# Patient Record
Sex: Female | Born: 1995 | Race: Black or African American
Health system: Southern US, Community
[De-identification: ages and names within clinical notes are randomized; demographics above are authoritative.]

## PROBLEM LIST (undated history)

## (undated) ENCOUNTER — Inpatient Hospital Stay (HOSPITAL_COMMUNITY): Payer: Self-pay

## (undated) ENCOUNTER — Inpatient Hospital Stay: Payer: Self-pay

## (undated) DIAGNOSIS — R531 Weakness: Secondary | ICD-10-CM

## (undated) DIAGNOSIS — R52 Pain, unspecified: Secondary | ICD-10-CM

## (undated) DIAGNOSIS — R0600 Dyspnea, unspecified: Secondary | ICD-10-CM

## (undated) DIAGNOSIS — D649 Anemia, unspecified: Secondary | ICD-10-CM

## (undated) DIAGNOSIS — Z62810 Personal history of physical and sexual abuse in childhood: Secondary | ICD-10-CM

## (undated) DIAGNOSIS — G43909 Migraine, unspecified, not intractable, without status migrainosus: Secondary | ICD-10-CM

## (undated) DIAGNOSIS — Z8619 Personal history of other infectious and parasitic diseases: Secondary | ICD-10-CM

## (undated) HISTORY — DX: Weakness: R53.1

## (undated) HISTORY — PX: WISDOM TOOTH EXTRACTION: SHX21

## (undated) HISTORY — DX: Pain, unspecified: R52

## (undated) HISTORY — PX: CHOLECYSTECTOMY: SHX55

## (undated) HISTORY — PX: APPENDECTOMY (OPEN): SHX54

## (undated) HISTORY — DX: Migraine, unspecified, not intractable, without status migrainosus: G43.909

## (undated) HISTORY — DX: Anemia, unspecified: D64.9

## (undated) HISTORY — DX: Personal history of physical and sexual abuse in childhood: Z62.810

## (undated) HISTORY — PX: APPENDECTOMY: SHX54

---
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
## 2014-04-15 ENCOUNTER — Emergency Department
Admission: EM | Admit: 2014-04-15 | Discharge: 2014-04-15 | Disposition: A | Discharge status: Home / Self Care | Payer: No Typology Code available for payment source | Attending: Pediatric Emergency Medicine | Admitting: Pediatric Emergency Medicine

## 2014-04-15 ENCOUNTER — Emergency Department: Payer: Self-pay

## 2014-04-15 DIAGNOSIS — R51 Headache: Secondary | ICD-10-CM | POA: Insufficient documentation

## 2014-04-15 DIAGNOSIS — R519 Headache, unspecified: Secondary | ICD-10-CM

## 2014-04-15 DIAGNOSIS — R55 Syncope and collapse: Secondary | ICD-10-CM | POA: Insufficient documentation

## 2014-04-15 LAB — COMPREHENSIVE METABOLIC PANEL
ALT: 36 U/L (ref 0–55)
AST (SGOT): 22 U/L (ref 5–34)
Albumin/Globulin Ratio: 1.1 (ref 0.9–2.2)
Albumin: 3.9 g/dL (ref 3.5–5.0)
Alkaline Phosphatase: 74 U/L (ref 50–130)
Anion Gap: 8 (ref 5.0–15.0)
BUN: 16.6 mg/dL (ref 7.0–19.0)
Bilirubin, Total: 0.7 mg/dL (ref 0.2–1.2)
CO2: 25 mEq/L (ref 22–29)
Calcium: 9.4 mg/dL (ref 8.5–10.5)
Chloride: 108 mEq/L (ref 100–111)
Creatinine: 0.8 mg/dL (ref 0.6–1.0)
Globulin: 3.5 g/dL (ref 2.0–3.6)
Glucose: 81 mg/dL (ref 70–100)
Potassium: 4.1 mEq/L (ref 3.5–5.1)
Protein, Total: 7.4 g/dL (ref 6.0–8.3)
Sodium: 141 mEq/L (ref 136–145)

## 2014-04-15 LAB — CBC AND DIFFERENTIAL
Basophils Absolute Automated: 0.03 10*3/uL (ref 0.00–0.20)
Basophils Automated: 1 %
Eosinophils Absolute Automated: 0.17 10*3/uL (ref 0.00–0.70)
Eosinophils Automated: 4 %
Hematocrit: 36.5 % — ABNORMAL LOW (ref 37.0–47.0)
Hgb: 12.1 g/dL (ref 12.0–16.0)
Immature Granulocytes Absolute: 0.01 10*3/uL
Immature Granulocytes: 0 %
Lymphocytes Absolute Automated: 1.81 10*3/uL (ref 0.50–4.40)
Lymphocytes Automated: 41 %
MCH: 29.6 pg (ref 28.0–32.0)
MCHC: 33.2 g/dL (ref 32.0–36.0)
MCV: 89.2 fL (ref 80.0–100.0)
MPV: 9.9 fL (ref 9.4–12.3)
Monocytes Absolute Automated: 0.48 10*3/uL (ref 0.00–1.20)
Monocytes: 11 %
Neutrophils Absolute: 1.96 10*3/uL (ref 1.80–8.10)
Neutrophils: 44 %
Platelets: 340 10*3/uL (ref 140–400)
RBC: 4.09 10*6/uL — ABNORMAL LOW (ref 4.20–5.40)
RDW: 14 % (ref 12–15)
WBC: 4.45 10*3/uL (ref 3.50–10.80)

## 2014-04-15 LAB — HCG, SERUM, QUALITATIVE: Hcg Qualitative: NEGATIVE

## 2014-04-15 MED ORDER — SODIUM CHLORIDE 0.9 % IV BOLUS
1000.0000 mL | Freq: Once | INTRAVENOUS | Status: AC
Start: 2014-04-15 — End: 2014-04-15
  Administered 2014-04-15: 1000 mL via INTRAVENOUS

## 2014-04-15 MED ORDER — KETOROLAC TROMETHAMINE 30 MG/ML IJ SOLN
30.0000 mg | Freq: Once | INTRAMUSCULAR | Status: AC
Start: 2014-04-15 — End: 2014-04-15
  Administered 2014-04-15: 30 mg via INTRAVENOUS
  Filled 2014-04-15: qty 1

## 2014-04-15 NOTE — ED Notes (Signed)
Per pt, was feeling nauseous and dizzy with headache, was talking to her family and then does not remember. Witnessed by mother who states pt fell PTA from standing position and regained consciousness within seconds. Now with headache, CP

## 2014-04-15 NOTE — Discharge Instructions (Addendum)
Syncope     You have been diagnosed with syncope (pronounced SINK-uh-pee).     This is the medical term for a rapid loss of consciousness or a fainting episode. There are many causes of syncope. Some of these are life-threatening and others are not serious. Most patients with life-threatening causes are admitted to the hospital for further testing. Patients without life-threatening conditions may be sent home.     Some non-life threatening causes of syncope include dehydration, heat exhaustion, vasovagal events (simple fainting), and side-effects from new medication.     Treatment of syncope depends on the cause. In general it is a good idea to drink plenty of fluids and avoid strenuous activity for at least 24 hours after fainting.     Follow up with your regular doctor within 3 days.     YOU SHOULD SEEK MEDICAL ATTENTION IMMEDIATELY, EITHER HERE OR AT THE NEAREST EMERGENCY DEPARTMENT, IF ANY OF THE FOLLOWING OCCURS:  · You continue to faint (pass out).  · You have any chest pain with your fainting.  · You notice any palpitations or strange heart beats before fainting.  · You notice any blood in your stools. Blood may be bright red or dark red or black and tar-like.  · You feel weak or lightheaded or do not improve as expected.  · You become worse or have other concerns.          Headache     You have been treated for a headache.     Headaches are very common. Most of the time they are benign (not harmful). Some headaches can be very serious. Your headache appears to be benign. The doctor feels it is OK for you to go home.     If you continue to have headaches, or if this headache does not resolve over the next few days, you should be evaluated by your regular doctor or a neurologist. Keep a “headache diary.” This may help your doctor learn the cause of your headaches.     Take your headache medication as directed. This is especially important if your doctor has placed you on a daily medication to prevent  headaches.     YOU SHOULD SEEK MEDICAL ATTENTION IMMEDIATELY, EITHER HERE OR AT THE NEAREST EMERGENCY DEPARTMENT, IF ANY OF THE FOLLOWING OCCURS:  · Your headache gets worse.  · You have a severe headache that occurs suddenly.  · Your head pain is different from your normal headache.  · You have a fever (temperature higher than 100.4ºF / 38ºC), especially with a stiff neck.  · You feel numbness, tingling, or weakness in your arms or legs.  · You pass out.  · You have problems with your vision.  · You vomit and have trouble taking medication or keeping it down.

## 2014-04-15 NOTE — ED Provider Notes (Signed)
Physician/Midlevel provider first contact with patient: 04/15/14 1953         History     Chief Complaint   Patient presents with   . Syncope   . Headache     HPI Comments: 18yo female presents with syncopal episode this evening.  Pt was talking with Parents, standing up, with complaints of dizziness and nausea.  Pt proceeded to have syncopal episode, woke up on floor, and had complaints of headache.  Pt Mother witnessed event, denies head injury.  Pt has never had syncopal episode in the past, but has had feelings of dizziness.  Pt has not had any recent illness.  Pt tolerating PO well.      Patient is a 18 y.o. female presenting with syncope. The history is provided by the patient and a parent. No language interpreter was used.   Loss of Consciousness  Episode history:  Single  Most recent episode:  Today  Duration:  3 seconds  Timing: Once.  Progression:  Improving  Chronicity:  New  Context: dehydration and standing up    Context: not blood draw and not urination    Witnessed: yes    Relieved by:  Lying down  Worsened by:  Posture  Associated symptoms: dizziness, headaches and nausea    Associated symptoms: no anxiety, no chest pain, no confusion, no difficulty breathing, no fever, no palpitations, no recent injury, no seizures, no shortness of breath, no visual change and no vomiting    Headaches:     Severity:  Moderate    Onset quality:  Gradual    Duration:  1 hour    Timing:  Constant    Progression:  Unchanged    Chronicity:  New  Nausea:     Severity:  Moderate    Onset quality:  Gradual    Duration:  1 hour    Timing:  Intermittent    Progression:  Unchanged        History reviewed. No pertinent past medical history.    Past Surgical History   Procedure Laterality Date   . Cholecystectomy     . Appendectomy         History reviewed. No pertinent family history.    Social  History   Substance Use Topics   . Smoking status: Never Smoker    . Smokeless tobacco: Not on file   . Alcohol Use: No       .      Allergies   Allergen Reactions   . Penicillins Shortness Of Breath and Rash       Discharge Medication List as of 04/15/2014 10:02 PM      CONTINUE these medications which have NOT CHANGED    Details   levonorgestrel-ethinyl estradiol (AVIANE,ALESSE,LESSINA) 0.1-20 MG-MCG per tablet Take 1 tablet by mouth daily., Until Discontinued, Historical Med              Review of Systems   Constitutional: Negative for fever, activity change and appetite change.   HENT: Negative for congestion and rhinorrhea.    Eyes: Negative for discharge and redness.   Respiratory: Negative for shortness of breath and stridor.    Cardiovascular: Positive for syncope. Negative for chest pain and palpitations.   Gastrointestinal: Positive for nausea. Negative for vomiting and abdominal pain.   Genitourinary: Negative for decreased urine volume and difficulty urinating.   Musculoskeletal: Negative for myalgias and arthralgias.   Skin: Negative for color change and rash.   Neurological: Positive for  dizziness, syncope and headaches. Negative for seizures.   Psychiatric/Behavioral: Negative for behavioral problems and confusion.       Physical Exam    BP: 122/77 mmHg, Heart Rate: 102, Temp: 99 F (37.2 C), Resp Rate: 18, SpO2: 99 %, Weight: 56.8 kg    Physical Exam   Constitutional: She is oriented to person, place, and time. She appears well-developed and well-nourished. No distress.   HENT:   Head: Normocephalic and atraumatic.   Right Ear: External ear normal.   Left Ear: External ear normal.   Nose: Nose normal.   Mouth/Throat: Oropharynx is clear and moist. No oropharyngeal exudate.   Eyes: Conjunctivae and EOM are normal. Pupils are equal, round, and reactive to light. Right eye exhibits no discharge. Left eye exhibits no discharge.   Neck: Normal range of motion. Neck supple. No JVD present.   Cardiovascular: Normal rate, regular rhythm, normal heart sounds and intact distal pulses.  Exam reveals no gallop and no friction rub.    No  murmur heard.  Pulmonary/Chest: Effort normal and breath sounds normal. No stridor. No respiratory distress. She has no wheezes.   Abdominal: Soft. Bowel sounds are normal. She exhibits no distension. There is no tenderness.   Musculoskeletal: Normal range of motion. She exhibits no edema or tenderness.   Neurological: She is alert and oriented to person, place, and time. No cranial nerve deficit. She exhibits normal muscle tone. Coordination normal.   Skin: Skin is warm and dry. No rash noted. She is not diaphoretic.   Nursing note and vitals reviewed.      MDM and ED Course     ED Medication Orders    Start     Status Ordering Provider    04/15/14 2134  ketorolac (TORADOL) injection 30 mg   Once     Route: Intravenous  Ordered Dose: 30 mg     Last MAR action:  Given [place] Surges    04/15/14 2039  sodium chloride 0.9 % bolus 1,000 mL   Once     Route: Intravenous  Ordered Dose: 1,000 mL     Last MAR action:  Stopped Harneet Noblett           MDM  Number of Diagnoses or Management Options  Generalized headache:   Syncope and collapse:   Diagnosis management comments: Diff Dx  Vasovagal syncope  Dehydration  Pregnancy  Viral syndrome  Intracranial process  Cardiac syncope       Amount and/or Complexity of Data Reviewed  Clinical lab tests: reviewed  Tests in the medicine section of CPT: reviewed    Risk of Complications, Morbidity, and/or Mortality  General comments: 18yo female with syncopal episode associated with dizziness and nausea just prior to arrival with resultant headache afterwards.  ECG reveals sinus tachycardia with nonspecific T wave abnormality, nonspecific ECG.  Orthostatics are unremarkable.  Pt given IVF rehydration for dehydration.  Labs reveal unremarkable CBC and CMP.  BHCG negative.  Upon reevaluation, Pt dizziness has improved, but with continued headache.  Pt given Toradol for headache.  Discussed findings with Pt and Mother, agree with plan.  [place] with supportive care and PMD follow up as  outpatient.    Patient Progress  Patient progress: stable      Oxygen saturation by pulse oximetry is 95%-100%, Normal.  Interventions: Patient Observed.    EKG Interpretation:    Rhythm:  Sinus Tachycardia  Ectopy:  None  Rate:  Normal  Conduction:  No blocks  ST  Segments:  Normal ST segments  T Waves:  Non-specific T Wave abnormality   Axis:  Normal  Q Waves:  None seen  Pacing:  Not applicable  Clinical Impression:  Non-specific EKG      Procedures    Clinical Impression & Disposition     Clinical Impression  Final diagnoses:   Syncope and collapse   Generalized headache        ED Disposition    Discharge [name] discharge to home/self care.    Condition at disposition: Stable             Discharge Medication List as of 04/15/2014 10:02 PM                          Ivan Croft, MD  04/16/14 1610

## 2014-04-16 LAB — ECG 12-LEAD
Atrial Rate: 101 {beats}/min
P Axis: 77 degrees
P-R Interval: 130 ms
Q-T Interval: 348 ms
QRS Duration: 82 ms
QTC Calculation (Bezet): 451 ms
R Axis: 74 degrees
T Axis: -11 degrees
Ventricular Rate: 101 {beats}/min

## 2015-03-31 ENCOUNTER — Ambulatory Visit (INDEPENDENT_AMBULATORY_CARE_PROVIDER_SITE_OTHER): Payer: Managed Care, Other (non HMO) | Admitting: Obstetrics and Gynecology

## 2015-03-31 VITALS — BP 120/73 | HR 103 | Ht 65.0 in | Wt 119.3 lb

## 2015-03-31 DIAGNOSIS — Z369 Encounter for antenatal screening, unspecified: Secondary | ICD-10-CM

## 2015-03-31 DIAGNOSIS — Z36 Encounter for antenatal screening of mother: Secondary | ICD-10-CM

## 2015-03-31 NOTE — Progress Notes (Cosign Needed)
[facility].   P0. Pregnancy eduction material explained and given. NOB labs ordered. HIV consent form signed. PNV encouraged and samples of Provida DHA given. NT ordered. Pt. To follow up with provider in 9 weeks for NOB physical. To follow up with for dating and viability scan in 4 weeks. Pt was not on contraception at the time she conceived. Denies cats or smoking in the home. Pt states she has a 1st cousin who is deaf, and a 1st cousin with dwarfism. All questions answered.

## 2015-03-31 NOTE — Patient Instructions (Signed)
Pt to f/u in 4 weeks for dating and viability scan

## 2015-04-03 ENCOUNTER — Other Ambulatory Visit: Payer: Self-pay | Admitting: Obstetrics and Gynecology

## 2015-04-03 ENCOUNTER — Other Ambulatory Visit: Payer: Managed Care, Other (non HMO)

## 2015-04-03 ENCOUNTER — Telehealth: Payer: Self-pay | Admitting: Obstetrics and Gynecology

## 2015-04-03 DIAGNOSIS — Z369 Encounter for antenatal screening, unspecified: Secondary | ICD-10-CM

## 2015-04-03 NOTE — Telephone Encounter (Signed)
Pt wanted to know if its safe to take the hep B vaccine while being pregnant.

## 2015-04-04 ENCOUNTER — Telehealth: Payer: Self-pay | Admitting: Obstetrics and Gynecology

## 2015-04-04 LAB — CBC WITH DIFFERENTIAL/PLATELET
Basophils Absolute: 0 10*3/uL (ref 0.0–0.2)
Basos: 0 %
EOS (ABSOLUTE): 0.1 10*3/uL (ref 0.0–0.4)
EOS: 3 %
HEMOGLOBIN: 10.8 g/dL — AB (ref 11.1–15.9)
Hematocrit: 33.3 % — ABNORMAL LOW (ref 34.0–46.6)
Immature Grans (Abs): 0 10*3/uL (ref 0.0–0.1)
Immature Granulocytes: 0 %
Lymphocytes Absolute: 2.4 10*3/uL (ref 0.7–3.1)
Lymphs: 48 %
MCH: 28.6 pg (ref 26.6–33.0)
MCHC: 32.4 g/dL (ref 31.5–35.7)
MCV: 88 fL (ref 79–97)
MONOS ABS: 0.4 10*3/uL (ref 0.1–0.9)
Monocytes: 8 %
Neutrophils Absolute: 2 10*3/uL (ref 1.4–7.0)
Neutrophils: 41 %
PLATELETS: 406 10*3/uL — AB (ref 150–379)
RBC: 3.77 x10E6/uL (ref 3.77–5.28)
RDW: 13.8 % (ref 12.3–15.4)
WBC: 5 10*3/uL (ref 3.4–10.8)

## 2015-04-04 LAB — URINALYSIS, ROUTINE W REFLEX MICROSCOPIC
Bilirubin, UA: NEGATIVE
Glucose, UA: NEGATIVE
Ketones, UA: NEGATIVE
Leukocytes, UA: NEGATIVE
NITRITE UA: NEGATIVE
Protein, UA: NEGATIVE
RBC, UA: NEGATIVE
Specific Gravity, UA: 1.013 (ref 1.005–1.030)
Urobilinogen, Ur: 0.2 mg/dL (ref 0.2–1.0)
pH, UA: 6 (ref 5.0–7.5)

## 2015-04-04 LAB — ABO AND RH: RH TYPE: POSITIVE

## 2015-04-04 LAB — HEP, RPR, HIV PANEL
HIV Screen 4th Generation wRfx: NONREACTIVE
Hepatitis B Surface Ag: NEGATIVE
RPR Ser Ql: NONREACTIVE

## 2015-04-04 LAB — VARICELLA ZOSTER ANTIBODY, IGG: Varicella zoster IgG: 241 index (ref >165)

## 2015-04-04 LAB — ANTIBODY SCREEN: Antibody Screen: NEGATIVE

## 2015-04-04 LAB — RUBELLA SCREEN: Rubella Antibodies, IGG: 1.7 index (ref >0.99)

## 2015-04-04 LAB — SICKLE CELL SCREEN: SICKLE CELL SCREEN: NEGATIVE

## 2015-04-04 NOTE — Telephone Encounter (Signed)
LM for pt informing her that Dr. Valentino Saxon recommended she not have a Hep C vaccine while pregnant. Pt advised to call back with any questions or concerns.

## 2015-04-04 NOTE — Telephone Encounter (Signed)
PT CALLED AND SHE WANTED TO KNOW IF IT IS SAFE TO BE AROUND OR WORK WITH METHOLTUCBUTALETHER, ITS A LIQUID, SHE WORKS AT [employer]

## 2015-04-05 LAB — URINE CULTURE: ORGANISM ID, BACTERIA: NO GROWTH

## 2015-04-06 LAB — GC/CHLAMYDIA PROBE AMP
Chlamydia trachomatis, NAA: POSITIVE — AB
NEISSERIA GONORRHOEAE BY PCR: NEGATIVE

## 2015-04-06 LAB — SPECIMEN STATUS REPORT

## 2015-04-08 ENCOUNTER — Telehealth: Payer: Self-pay

## 2015-04-08 DIAGNOSIS — A749 Chlamydial infection, unspecified: Secondary | ICD-10-CM

## 2015-04-08 DIAGNOSIS — D509 Iron deficiency anemia, unspecified: Secondary | ICD-10-CM

## 2015-04-08 DIAGNOSIS — O99011 Anemia complicating pregnancy, first trimester: Principal | ICD-10-CM

## 2015-04-08 DIAGNOSIS — O98311 Other infections with a predominantly sexual mode of transmission complicating pregnancy, first trimester: Secondary | ICD-10-CM

## 2015-04-08 MED ORDER — FERROUS SULFATE 325 (65 FE) MG PO TBEC: 325.0000 mg | DELAYED_RELEASE_TABLET | Freq: Every day | ORAL | Status: DC

## 2015-04-08 MED ORDER — AZITHROMYCIN 500 MG PO TABS: ORAL_TABLET | ORAL | Status: DC

## 2015-04-08 MED ORDER — FERROUS SULFATE 325 (65 FE) MG PO TBEC: 325.0000 mg | DELAYED_RELEASE_TABLET | Freq: Three times a day (TID) | ORAL | Status: DC

## 2015-04-08 NOTE — Telephone Encounter (Signed)
Called pt & LM for pt to call back

## 2015-04-08 NOTE — Telephone Encounter (Signed)
Pt informed of positive Chlamydia, rx sent in. Informed pt of the need to refrain from sex for 7-14 days after she and partner have been treated. Pt gave verbal understanding.

## 2015-04-08 NOTE — Telephone Encounter (Signed)
Pt advised on recommendations below per Dr.Cherry. Pt states that she does have access to gloves as well as a face mask and uses them on a regular basis. Advised pt to continue these practices. Pt gives verbal understanding.

## 2015-04-08 NOTE — Telephone Encounter (Signed)
As long as patient is taking necessary precaution (gloves, face mask) to prevent inhalation and skin contact.  If patient is unable to do this, she may need to transfer to another area.

## 2015-04-08 NOTE — Telephone Encounter (Signed)
-----   Message from Hildred Laser, MD sent at 04/08/2015 11:30 AM EDT ----- Patient with chlamydia positive.  Needs treatment with Azithromycin 1 gram. Needs partner treatment.

## 2015-04-18 NOTE — Progress Notes (Signed)
I have reviewed the documentation.   Hildred Laser, MD [facility]

## 2015-04-29 ENCOUNTER — Other Ambulatory Visit: Payer: No Typology Code available for payment source

## 2015-04-29 DIAGNOSIS — Z369 Encounter for antenatal screening, unspecified: Secondary | ICD-10-CM

## 2015-04-29 DIAGNOSIS — Z36 Encounter for antenatal screening of mother: Secondary | ICD-10-CM | POA: Diagnosis not present

## 2015-06-06 LAB — OB RESULTS CONSOLE HEPATITIS B SURFACE ANTIGEN: Hepatitis B Surface Ag: NEGATIVE

## 2015-06-06 LAB — OB RESULTS CONSOLE ANTIBODY SCREEN: ANTIBODY SCREEN: NEGATIVE

## 2015-06-06 LAB — OB RESULTS CONSOLE GC/CHLAMYDIA
Chlamydia: NEGATIVE
GC PROBE AMP, GENITAL: NEGATIVE

## 2015-06-06 LAB — OB RESULTS CONSOLE HIV ANTIBODY (ROUTINE TESTING): HIV: NONREACTIVE

## 2015-06-06 LAB — OB RESULTS CONSOLE ABO/RH: RH TYPE: POSITIVE

## 2015-06-06 LAB — OB RESULTS CONSOLE RPR: RPR: NONREACTIVE

## 2015-06-06 LAB — OB RESULTS CONSOLE RUBELLA ANTIBODY, IGM: Rubella: IMMUNE

## 2015-06-10 ENCOUNTER — Encounter: Payer: Managed Care, Other (non HMO) | Admitting: Obstetrics and Gynecology

## 2015-06-26 ENCOUNTER — Emergency Department: Payer: No Typology Code available for payment source

## 2015-06-26 ENCOUNTER — Encounter: Payer: Self-pay | Admitting: Emergency Medicine

## 2015-06-26 ENCOUNTER — Emergency Department
Admission: EM | Admit: 2015-06-26 | Discharge: 2015-06-26 | Disposition: A | Discharge status: Home / Self Care | Payer: No Typology Code available for payment source | Attending: Emergency Medicine | Admitting: Emergency Medicine

## 2015-06-26 DIAGNOSIS — Z3A17 17 weeks gestation of pregnancy: Secondary | ICD-10-CM | POA: Insufficient documentation

## 2015-06-26 DIAGNOSIS — O210 Mild hyperemesis gravidarum: Secondary | ICD-10-CM | POA: Diagnosis not present

## 2015-06-26 DIAGNOSIS — Z792 Long term (current) use of antibiotics: Secondary | ICD-10-CM | POA: Insufficient documentation

## 2015-06-26 DIAGNOSIS — R109 Unspecified abdominal pain: Secondary | ICD-10-CM

## 2015-06-26 DIAGNOSIS — Z79899 Other long term (current) drug therapy: Secondary | ICD-10-CM | POA: Insufficient documentation

## 2015-06-26 DIAGNOSIS — Z880 Allergy status to penicillin: Secondary | ICD-10-CM | POA: Diagnosis not present

## 2015-06-26 DIAGNOSIS — O209 Hemorrhage in early pregnancy, unspecified: Secondary | ICD-10-CM | POA: Diagnosis not present

## 2015-06-26 DIAGNOSIS — O9989 Other specified diseases and conditions complicating pregnancy, childbirth and the puerperium: Secondary | ICD-10-CM | POA: Diagnosis present

## 2015-06-26 DIAGNOSIS — O26899 Other specified pregnancy related conditions, unspecified trimester: Secondary | ICD-10-CM

## 2015-06-26 DIAGNOSIS — O4692 Antepartum hemorrhage, unspecified, second trimester: Secondary | ICD-10-CM

## 2015-06-26 LAB — URINALYSIS COMPLETE WITH MICROSCOPIC (ARMC ONLY)
BILIRUBIN URINE: NEGATIVE
Bacteria, UA: NONE SEEN
Glucose, UA: NEGATIVE mg/dL
Hgb urine dipstick: NEGATIVE
KETONES UR: NEGATIVE mg/dL
LEUKOCYTES UA: NEGATIVE
NITRITE: NEGATIVE
PH: 7 (ref 5.0–8.0)
PROTEIN: NEGATIVE mg/dL
SPECIFIC GRAVITY, URINE: 1.014 (ref 1.005–1.030)

## 2015-06-26 LAB — COMPREHENSIVE METABOLIC PANEL
ALT: 24 U/L (ref 14–54)
ANION GAP: 5 (ref 5–15)
AST: 22 U/L (ref 15–41)
Albumin: 3.7 g/dL (ref 3.5–5.0)
Alkaline Phosphatase: 60 U/L (ref 38–126)
BUN: 9 mg/dL (ref 6–20)
CHLORIDE: 104 mmol/L (ref 101–111)
CO2: 25 mmol/L (ref 22–32)
Calcium: 9.4 mg/dL (ref 8.9–10.3)
Creatinine, Ser: 0.5 mg/dL (ref 0.44–1.00)
Glucose, Bld: 79 mg/dL (ref 65–99)
POTASSIUM: 4 mmol/L (ref 3.5–5.1)
SODIUM: 134 mmol/L — AB (ref 135–145)
Total Bilirubin: 0.6 mg/dL (ref 0.3–1.2)
Total Protein: 7.2 g/dL (ref 6.5–8.1)

## 2015-06-26 LAB — WET PREP, GENITAL
Clue Cells Wet Prep HPF POC: NONE SEEN
TRICH WET PREP: NONE SEEN
YEAST WET PREP: NONE SEEN

## 2015-06-26 LAB — CBC WITH DIFFERENTIAL/PLATELET
Basophils Absolute: 0 10*3/uL (ref 0–0.1)
Basophils Relative: 1 %
EOS ABS: 0.1 10*3/uL (ref 0–0.7)
EOS PCT: 2 %
HCT: 32.7 % — ABNORMAL LOW (ref 35.0–47.0)
Hemoglobin: 11 g/dL — ABNORMAL LOW (ref 12.0–16.0)
LYMPHS ABS: 2 10*3/uL (ref 1.0–3.6)
LYMPHS PCT: 26 %
MCH: 29.9 pg (ref 26.0–34.0)
MCHC: 33.7 g/dL (ref 32.0–36.0)
MCV: 88.7 fL (ref 80.0–100.0)
MONO ABS: 0.7 10*3/uL (ref 0.2–0.9)
Monocytes Relative: 9 %
Neutro Abs: 5 10*3/uL (ref 1.4–6.5)
Neutrophils Relative %: 62 %
PLATELETS: 281 10*3/uL (ref 150–440)
RBC: 3.69 MIL/uL — AB (ref 3.80–5.20)
RDW: 13.5 % (ref 11.5–14.5)
WBC: 7.8 10*3/uL (ref 3.6–11.0)

## 2015-06-26 LAB — CHLAMYDIA/NGC RT PCR (ARMC ONLY)
Chlamydia Tr: NOT DETECTED
N GONORRHOEAE: NOT DETECTED

## 2015-06-26 LAB — TYPE AND SCREEN
ABO/RH(D): O POS
Antibody Screen: NEGATIVE

## 2015-06-26 LAB — HCG, QUANTITATIVE, PREGNANCY: HCG, BETA CHAIN, QUANT, S: 74233 m[IU]/mL — AB (ref <5)

## 2015-06-26 LAB — ABO/RH: ABO/RH(D): O POS

## 2015-06-26 MED ORDER — ACETAMINOPHEN 500 MG PO TABS
1000.0000 mg | ORAL_TABLET | Freq: Once | ORAL | Status: AC
  Administered 2015-06-26: 1000 mg via ORAL
  Filled 2015-06-26: qty 2

## 2015-06-26 MED ORDER — ONDANSETRON HCL 4 MG/2ML IJ SOLN
4.0000 mg | Freq: Once | INTRAMUSCULAR | Status: AC
  Administered 2015-06-26: 4 mg via INTRAVENOUS
  Filled 2015-06-26: qty 2

## 2015-06-26 MED ORDER — ONDANSETRON HCL 4 MG PO TABS: 4.0000 mg | ORAL_TABLET | Freq: Three times a day (TID) | ORAL | Status: AC | PRN

## 2015-06-26 MED ORDER — ACETAMINOPHEN 500 MG PO TABS
ORAL_TABLET | ORAL | Status: AC
  Administered 2015-06-26: 1000 mg via ORAL
  Filled 2015-06-26: qty 2

## 2015-06-26 MED ORDER — SODIUM CHLORIDE 0.9 % IV BOLUS (SEPSIS)
1000.0000 mL | Freq: Once | INTRAVENOUS | Status: AC
  Administered 2015-06-26: 1000 mL via INTRAVENOUS

## 2015-06-26 MED ORDER — FENTANYL CITRATE (PF) 100 MCG/2ML IJ SOLN
50.0000 ug | Freq: Once | INTRAMUSCULAR | Status: AC
  Administered 2015-06-26: 50 ug via INTRAVENOUS
  Filled 2015-06-26: qty 2

## 2015-06-26 NOTE — ED Notes (Signed)
Pt complains of intermittent spotting just when wiping and cramping. Pt states this is her first pregnancy.

## 2015-06-26 NOTE — ED Notes (Signed)
Patient transported to Ultrasound 

## 2015-06-26 NOTE — Telephone Encounter (Signed)
ERROR

## 2015-06-26 NOTE — ED Provider Notes (Signed)
[facility] Emergency Department Provider Note  ____________________________________________  Time seen: Approximately 2:21 PM  I have reviewed the triage vital signs and the nursing notes.   HISTORY  Chief Complaint Abdominal Pain    HPI Nicole Evans is a 19 y.o. female status post appendectomy and chole remotely, G1 P0 approximately [redacted] weeks pregnant by LMP, presenting for vaginal spotting now resolved and lower abdominal cramping.Patient reports that yesterday she had some mild spotting on the toilet paper, this has resolved. Today she was cooking when she developed lower abdominal "cramping". It started on the left and moved to the right. She did not have any gush of fluid, return of vaginal bleeding, lightheadedness or shortness of breath. She has had hyperemesis during first trimester this pregnancy and recently has had increased problems with nausea and vomiting. She is followed by an OB in [place] and has had a reportedly normal ultrasound during this pregnancy. She does report some mild increase in her vaginal discharge but no change in color or odor. No recent illness like cough or cold symptoms, diarrhea, or fever. No urinary symptoms.   Past Medical History  Diagnosis Date  . Migraine     There are no active problems to display for this patient.   Past Surgical History  Procedure Laterality Date  . Appendectomy    . Cholecystectomy      Current Outpatient Rx  Name  Route  Sig  Dispense  Refill  . amitriptyline (ELAVIL) 25 MG tablet   Oral   Take 25 mg by mouth at bedtime.         Marland Kitchen azithromycin (ZITHROMAX) 500 MG tablet      Take 2 tablets by mouth as a one time dose   2 tablet   1   . ferrous sulfate 325 (65 FE) MG EC tablet   Oral   Take 1 tablet (325 mg total) by mouth daily with breakfast.   30 tablet   3   . nitrofurantoin, macrocrystal-monohydrate, (MACROBID) 100 MG capsule   Oral   Take 100 mg by mouth 2 (two)  times daily.         . ondansetron (ZOFRAN) 4 MG tablet   Oral   Take 1 tablet (4 mg total) by mouth every 8 (eight) hours as needed for nausea or vomiting.   30 tablet   0     Allergies Penicillins  Family History  Problem Relation Age of Onset  . Hypertension Mother   . Fibromyalgia Mother   . Healthy Father     Social History Social History  Substance Use Topics  . Smoking status: Never Smoker   . Smokeless tobacco: None  . Alcohol Use: No    Review of Systems Constitutional: No fever/chills. No lightheadedness or syncope. Eyes: No visual changes. ENT: No sore throat. Cardiovascular: Denies chest pain, palpitations. Respiratory: Denies shortness of breath.  No cough. Gastrointestinal: Lower abdominal pain.  Continued nausea, and vomiting.  No diarrhea.  No constipation. Genitourinary: Negative for dysuria. Positive for increased vaginal discharge. Positive for vaginal bleeding. Musculoskeletal: Negative for back pain. Skin: Negative for rash. Neurological: Negative for headaches, focal weakness or numbness.  10-point ROS otherwise negative.  ____________________________________________   PHYSICAL EXAM:  VITAL SIGNS: ED Triage Vitals  Enc Vitals Group     BP 06/26/15 1300 131/75 mmHg     Pulse Rate 06/26/15 1300 102     Resp 06/26/15 1300 18     Temp 06/26/15 1300 98.1  F (36.7 C)     Temp Source 06/26/15 1300 Oral     SpO2 06/26/15 1300 99 %     Weight 06/26/15 1300 130 lb (58.968 kg)     Height 06/26/15 1300 5\' 5"  (1.651 m)     Head Cir --      Peak Flow --      Pain Score 06/26/15 1301 7     Pain Loc --      Pain Edu? --      Excl. in GC? --     Constitutional: Alert and oriented. Well appearing and who appears mildly uncomfortable. She is able to answer questions appropriately. Eyes: Conjunctivae are normal.  EOMI. Head: Atraumatic. Nose: No congestion/rhinnorhea. Mouth/Throat: Mucous membranes are moist.  Neck: No stridor.  Supple.  Full  range of motion without pain. Cardiovascular: Normal rate, regular rhythm. No murmurs, rubs or gallops.  Respiratory: Normal respiratory effort.  No retractions. Lungs CTAB.  No wheezes, rales or ronchi. Gastrointestinal: Soft and diffusely minimally tender in the lower abdomen without rebound or guarding. No distention. Gravid uterus palpable to approximately 3 cm below the umbilicus. Genitourinary: Normal-appearing external genitalia without lesions. Speculum exam reveals thick white discharge that is malodorous. Normal-appearing cervix. Bimanual exam reveals closed cervix, no CMT, minimal pain with palpation over the left and right adnexa but no peritoneal signs. Gravid fundus as described on the abdominal exam. Musculoskeletal: No LE edema.  Neurologic:  Normal speech and language. No gross focal neurologic deficits are appreciated.  Skin:  Skin is warm, dry and intact. No rash noted. Psychiatric: Mood and affect are normal. Speech and behavior are normal.  Normal judgement.  ____________________________________________   LABS (all labs ordered are listed, but only abnormal results are displayed)  Labs Reviewed  WET PREP, GENITAL - Abnormal; Notable for the following:    WBC, Wet Prep HPF POC FEW (*)    All other components within normal limits  CBC WITH DIFFERENTIAL/PLATELET - Abnormal; Notable for the following:    RBC 3.69 (*)    Hemoglobin 11.0 (*)    HCT 32.7 (*)    All other components within normal limits  COMPREHENSIVE METABOLIC PANEL - Abnormal; Notable for the following:    Sodium 134 (*)    All other components within normal limits  HCG, QUANTITATIVE, PREGNANCY - Abnormal; Notable for the following:    hCG, Beta Chain, Quant, S [id number] (*)    All other components within normal limits  URINALYSIS COMPLETEWITH MICROSCOPIC ([facility] ONLY) - Abnormal; Notable for the following:    Color, Urine YELLOW (*)    APPearance CLEAR (*)    Squamous Epithelial / LPF 0-5 (*)    All other  components within normal limits  CHLAMYDIA/NGC RT PCR ([facility] ONLY)  TYPE AND SCREEN  ABO/RH   ____________________________________________  EKG  Not indicated ____________________________________________  RADIOLOGY  [place] Ob Limited  06/26/2015  CLINICAL DATA:  Vaginal bleeding in second trimester. EXAM: LIMITED OBSTETRIC ULTRASOUND FINDINGS: Number of Fetuses: 1 Heart Rate:  157 bpm Movement: Yes Presentation: Cephalic Placental Location: Posterior Previa: No Amniotic Fluid (Subjective):  Within normal limits. BPD:  3.6cm 16w  6d MATERNAL FINDINGS: Cervix:  Appears closed.  3.4 cm cervical length Uterus/Adnexae:  No abnormality visualized. IMPRESSION: Exam within normal limits. This exam is performed on an emergent basis and does not comprehensively evaluate fetal size, dating, or anatomy; follow-up complete OB US should be considered if further fetal assessment is warranted. Electronically Signed   By:  Genevive Bi M.D.   On: 06/26/2015 15:15    ____________________________________________   PROCEDURES  Procedure(s) performed: None  Critical Care performed: No ____________________________________________   INITIAL IMPRESSION / ASSESSMENT AND PLAN / ED COURSE  Pertinent labs & imaging results that were available during my care of the patient were reviewed by me and considered in my medical decision making (see chart for details).  19 y.o. G1 P0 (May [redacted] weeks pregnant with vaginal spotting yesterday and today lower abdominal cramping. Recently the patient has had an increase in her nausea and vomiting, so I would consider uterine irritability from dehydration in the differential. We'll also evaluate for abortion, vaginal infection, or urinary infection. The pain she describes is less likely due to ovarian torsion, but the ovaries will be assessed and the ultrasound. She does not have an appendix, and she does not have a gallbladder. GI viral illness is also possible but  less likely.   ----------------------------------------- 4:21 PM on 06/26/2015 -----------------------------------------  Patient is feeling better. Her vitals are reassuring. Her exam shows vaginal discharge and some mild abdominal tenderness but her ultrasound shows normal fetus with fetal heart tones in the 150s. She does not have an elevated white blood cell count and her urine does not appear to be infected. I will await the wet prep results but anticipate discharge. She is O+ so Auralgan is not indicated.  ___________ _________________________________  FINAL CLINICAL IMPRESSION(S) / ED DIAGNOSES  Final diagnoses:  Vaginal bleeding in pregnancy, second trimester  Abdominal pain in pregnancy      NEW MEDICATIONS STARTED DURING THIS VISIT:  Discharge Medication List as of 06/26/2015  4:57 PM    START taking these medications   Details  ondansetron (ZOFRAN) 4 MG tablet Take 1 tablet (4 mg total) by mouth every 8 (eight) hours as needed for nausea or vomiting., Starting 06/26/2015, Until Fri 06/25/16, Print         Rockne Menghini, MD 06/26/15 2014

## 2015-06-26 NOTE — ED Notes (Signed)
Pt to ED with c/o lower abd. Cramping and spotting vaginal bleeding, states she is [redacted] weeks pregnant

## 2015-06-26 NOTE — Discharge Instructions (Signed)
Please drink plenty of fluid to stay well hydrated. Please make an appointment to follow up with your OB/GYN; make sure they follow up gonorrhea and chlamydia testing that we did today.  You may take Tylenol for pain. He may take Zofran for nausea.  Please return to the emergency department if you develop severe pain, gush of fluid, lightheadedness or fainting, fever, or any other symptoms concerning to you.

## 2015-09-14 DIAGNOSIS — Z8759 Personal history of other complications of pregnancy, childbirth and the puerperium: Secondary | ICD-10-CM | POA: Insufficient documentation

## 2015-09-14 NOTE — L&D Delivery Note (Signed)
Operative Delivery Note At 8:09 AM a viable female was delivered via Vaginal, Vacuum Investment banker, operational(Extractor).  Presentation: vertex; Position: Right,, Occiput,, Anterior; Station: +2.  Verbal consent: obtained from patient.  Risks and benefits discussed in detail.  Risks include, but are not limited to the risks of anesthesia, bleeding, infection, damage to maternal tissues, fetal cephalhematoma.  There is also the risk of inability to effect vaginal delivery of the head, or shoulder dystocia that cannot be resolved by established maneuvers, leading to the need for emergency cesarean section.  APGAR: 8, 9; weight  .   Placenta status: Intact, Spontaneous.   Cord: 3 vessels with the following complications: Short.  Cord pH: not obtained  Anesthesia: Epidural  Instruments: mushroom delivered with 2 pulls no popoffs - easy vacuum Episiotomy: None Lacerations: 2nd degree Suture Repair: 3.0 chromic Est. Blood Loss (mL): 300  Mom to postpartum.  Baby to Couplet care / Skin to Skin.  Nicole Evans L 12/10/2015, 8:32 AM

## 2015-12-07 ENCOUNTER — Inpatient Hospital Stay (HOSPITAL_COMMUNITY)
Admission: AD | Admit: 2015-12-07 | Discharge: 2015-12-07 | Disposition: A | Discharge status: Home / Self Care | Payer: BLUE CROSS/BLUE SHIELD | Source: Ambulatory Visit | Attending: Obstetrics and Gynecology | Admitting: Obstetrics and Gynecology

## 2015-12-07 ENCOUNTER — Inpatient Hospital Stay (HOSPITAL_COMMUNITY): Payer: BLUE CROSS/BLUE SHIELD | Admitting: *Deleted

## 2015-12-07 DIAGNOSIS — Z3493 Encounter for supervision of normal pregnancy, unspecified, third trimester: Secondary | ICD-10-CM | POA: Insufficient documentation

## 2015-12-07 NOTE — MAU Note (Signed)
Pt. States she felt baby move earlier in the day but has noticed a decrease in fetal movement all day. Also, states she has been contracting for 3 days on and off. Denies LOF or bleeding. Pt. States she is to be induced Wednesday.  Here for evaluation.

## 2015-12-07 NOTE — Discharge Instructions (Signed)
Braxton Hicks Contractions °Contractions of the uterus can occur throughout pregnancy. Contractions are not always a sign that you are in labor.  °WHAT ARE BRAXTON HICKS CONTRACTIONS?  °Contractions that occur before labor are called Braxton Hicks contractions, or false labor. Toward the end of pregnancy (32-34 weeks), these contractions can develop more often and may become more forceful. This is not true labor because these contractions do not result in opening (dilatation) and thinning of the cervix. They are sometimes difficult to tell apart from true labor because these contractions can be forceful and people have different pain tolerances. You should not feel embarrassed if you go to the hospital with false labor. Sometimes, the only way to tell if you are in true labor is for your health care provider to look for changes in the cervix. °If there are no prenatal problems or other health problems associated with the pregnancy, it is completely safe to be sent home with false labor and await the onset of true labor. °HOW CAN YOU TELL THE DIFFERENCE BETWEEN TRUE AND FALSE LABOR? °False Labor °· The contractions of false labor are usually shorter and not as hard as those of true labor.   °· The contractions are usually irregular.   °· The contractions are often felt in the front of the lower abdomen and in the groin.   °· The contractions may go away when you walk around or change positions while lying down.   °· The contractions get weaker and are shorter lasting as time goes on.   °· The contractions do not usually become progressively stronger, regular, and closer together as with true labor.   °True Labor °· Contractions in true labor last 30-70 seconds, become very regular, usually become more intense, and increase in frequency.   °· The contractions do not go away with walking.   °· The discomfort is usually felt in the top of the uterus and spreads to the lower abdomen and low back.   °· True labor can be  determined by your health care provider with an exam. This will show that the cervix is dilating and getting thinner.   °WHAT TO REMEMBER °· Keep up with your usual exercises and follow other instructions given by your health care provider.   °· Take medicines as directed by your health care provider.   °· Keep your regular prenatal appointments.   °· Eat and drink lightly if you think you are going into labor.   °· If Braxton Hicks contractions are making you uncomfortable:   °¨ Change your position from lying down or resting to walking, or from walking to resting.   °¨ Sit and rest in a tub of warm water.   °¨ Drink 2-3 glasses of water. Dehydration may cause these contractions.   °¨ Do slow and deep breathing several times an hour.   °WHEN SHOULD I SEEK IMMEDIATE MEDICAL CARE? °Seek immediate medical care if: °· Your contractions become stronger, more regular, and closer together.   °· You have fluid leaking or gushing from your vagina.   °· You have a fever.   °· You pass blood-tinged mucus.   °· You have vaginal bleeding.   °· You have continuous abdominal pain.   °· You have low back pain that you never had before.   °· You feel your baby's head pushing down and causing pelvic pressure.   °· Your baby is not moving as much as it used to.   °  °This information is not intended to replace advice given to you by your health care provider. Make sure you discuss any questions you have with your health care   provider. °  °Document Released: 08/30/2005 Document Revised: 09/04/2013 Document Reviewed: 06/11/2013 °Elsevier Interactive Patient Education ©2016 [employer] °Fetal Movement Counts °Patient Name: __________________________________________________ Patient Due Date: ____________________ °Performing a fetal movement count is highly recommended in high-risk pregnancies, but it is good for every pregnant woman to do. Your health care provider may ask you to start counting fetal movements at 28 weeks of the  pregnancy. Fetal movements often increase: °· After eating a full meal. °· After physical activity. °· After eating or drinking something sweet or cold. °· At rest. °Pay attention to when you feel the baby is most active. This will help you notice a pattern of your baby's sleep and wake cycles and what factors contribute to an increase in fetal movement. It is important to perform a fetal movement count at the same time each day when your baby is normally most active.  °HOW TO COUNT FETAL MOVEMENTS °· Find a quiet and comfortable area to sit or lie down on your left side. Lying on your left side provides the best blood and oxygen circulation to your baby. °· Write down the day and time on a sheet of paper or in a journal. °· Start counting kicks, flutters, swishes, rolls, or jabs in a 2-hour period. You should feel at least 10 movements within 2 hours. °· If you do not feel 10 movements in 2 hours, wait 2-3 hours and count again. Look for a change in the pattern or not enough counts in 2 hours. °SEEK MEDICAL CARE IF: °· You feel less than 10 counts in 2 hours, tried twice. °· There is no movement in over an hour. °· The pattern is changing or taking longer each day to reach 10 counts in 2 hours. °· You feel the baby is not moving as he or she usually does. °Date: ____________ Movements: ____________ Start time: ____________ Finish time: ____________  °Date: ____________ Movements: ____________ Start time: ____________ Finish time: ____________ °Date: ____________ Movements: ____________ Start time: ____________ Finish time: ____________ °Date: ____________ Movements: ____________ Start time: ____________ Finish time: ____________ °Date: ____________ Movements: ____________ Start time: ____________ Finish time: ____________ °Date: ____________ Movements: ____________ Start time: ____________ Finish time: ____________ °Date: ____________ Movements: ____________ Start time: ____________ Finish time: ____________ °Date:  ____________ Movements: ____________ Start time: ____________ Finish time: ____________  °Date: ____________ Movements: ____________ Start time: ____________ Finish time: ____________ °Date: ____________ Movements: ____________ Start time: ____________ Finish time: ____________ °Date: ____________ Movements: ____________ Start time: ____________ Finish time: ____________ °Date: ____________ Movements: ____________ Start time: ____________ Finish time: ____________ °Date: ____________ Movements: ____________ Start time: ____________ Finish time: ____________ °Date: ____________ Movements: ____________ Start time: ____________ Finish time: ____________ °Date: ____________ Movements: ____________ Start time: ____________ Finish time: ____________  °Date: ____________ Movements: ____________ Start time: ____________ Finish time: ____________ °Date: ____________ Movements: ____________ Start time: ____________ Finish time: ____________ °Date: ____________ Movements: ____________ Start time: ____________ Finish time: ____________ °Date: ____________ Movements: ____________ Start time: ____________ Finish time: ____________ °Date: ____________ Movements: ____________ Start time: ____________ Finish time: ____________ °Date: ____________ Movements: ____________ Start time: ____________ Finish time: ____________ °Date: ____________ Movements: ____________ Start time: ____________ Finish time: ____________  °Date: ____________ Movements: ____________ Start time: ____________ Finish time: ____________ °Date: ____________ Movements: ____________ Start time: ____________ Finish time: ____________ °Date: ____________ Movements: ____________ Start time: ____________ Finish time: ____________ °Date: ____________ Movements: ____________ Start time: ____________ Finish time: ____________ °Date: ____________ Movements: ____________ Start time: ____________ Finish time: ____________ °Date: ____________ Movements: ____________ Start  time: ____________ Finish time:   ____________ °Date: ____________ Movements: ____________ Start time: ____________ Finish time: ____________  °Date: ____________ Movements: ____________ Start time: ____________ Finish time: ____________ °Date: ____________ Movements: ____________ Start time: ____________ Finish time: ____________ °Date: ____________ Movements: ____________ Start time: ____________ Finish time: ____________ °Date: ____________ Movements: ____________ Start time: ____________ Finish time: ____________ °Date: ____________ Movements: ____________ Start time: ____________ Finish time: ____________ °Date: ____________ Movements: ____________ Start time: ____________ Finish time: ____________ °Date: ____________ Movements: ____________ Start time: ____________ Finish time: ____________  °Date: ____________ Movements: ____________ Start time: ____________ Finish time: ____________ °Date: ____________ Movements: ____________ Start time: ____________ Finish time: ____________ °Date: ____________ Movements: ____________ Start time: ____________ Finish time: ____________ °Date: ____________ Movements: ____________ Start time: ____________ Finish time: ____________ °Date: ____________ Movements: ____________ Start time: ____________ Finish time: ____________ °Date: ____________ Movements: ____________ Start time: ____________ Finish time: ____________ °Date: ____________ Movements: ____________ Start time: ____________ Finish time: ____________  °Date: ____________ Movements: ____________ Start time: ____________ Finish time: ____________ °Date: ____________ Movements: ____________ Start time: ____________ Finish time: ____________ °Date: ____________ Movements: ____________ Start time: ____________ Finish time: ____________ °Date: ____________ Movements: ____________ Start time: ____________ Finish time: ____________ °Date: ____________ Movements: ____________ Start time: ____________ Finish time:  ____________ °Date: ____________ Movements: ____________ Start time: ____________ Finish time: ____________ °Date: ____________ Movements: ____________ Start time: ____________ Finish time: ____________  °Date: ____________ Movements: ____________ Start time: ____________ Finish time: ____________ °Date: ____________ Movements: ____________ Start time: ____________ Finish time: ____________ °Date: ____________ Movements: ____________ Start time: ____________ Finish time: ____________ °Date: ____________ Movements: ____________ Start time: ____________ Finish time: ____________ °Date: ____________ Movements: ____________ Start time: ____________ Finish time: ____________ °Date: ____________ Movements: ____________ Start time: ____________ Finish time: ____________ °  °This information is not intended to replace advice given to you by your health care provider. Make sure you discuss any questions you have with your health care provider. °  °Document Released: 09/29/2006 Document Revised: 09/20/2014 Document Reviewed: 06/26/2012 °Elsevier Interactive Patient Education ©2016 [employer] ° °

## 2015-12-08 ENCOUNTER — Telehealth (HOSPITAL_COMMUNITY): Payer: Self-pay | Admitting: *Deleted

## 2015-12-08 ENCOUNTER — Encounter (HOSPITAL_COMMUNITY): Payer: Self-pay | Admitting: *Deleted

## 2015-12-08 LAB — OB RESULTS CONSOLE GBS: STREP GROUP B AG: NEGATIVE

## 2015-12-08 NOTE — Telephone Encounter (Signed)
Preadmission screen  

## 2015-12-09 ENCOUNTER — Encounter (HOSPITAL_COMMUNITY): Payer: Self-pay | Admitting: *Deleted

## 2015-12-09 ENCOUNTER — Inpatient Hospital Stay (HOSPITAL_COMMUNITY)
Admission: AD | Admit: 2015-12-09 | Discharge: 2015-12-13 | DRG: 774 | Disposition: A | Discharge status: Home / Self Care | Payer: BLUE CROSS/BLUE SHIELD | Source: Ambulatory Visit | Attending: Obstetrics and Gynecology | Admitting: Obstetrics and Gynecology

## 2015-12-09 ENCOUNTER — Inpatient Hospital Stay (HOSPITAL_COMMUNITY)
Admission: AD | Admit: 2015-12-09 | Discharge: 2015-12-09 | Disposition: A | Discharge status: Home / Self Care | Payer: BLUE CROSS/BLUE SHIELD | Source: Ambulatory Visit | Attending: Obstetrics and Gynecology | Admitting: Obstetrics and Gynecology

## 2015-12-09 DIAGNOSIS — Z3A40 40 weeks gestation of pregnancy: Secondary | ICD-10-CM

## 2015-12-09 DIAGNOSIS — Z8249 Family history of ischemic heart disease and other diseases of the circulatory system: Secondary | ICD-10-CM | POA: Diagnosis not present

## 2015-12-09 DIAGNOSIS — O8612 Endometritis following delivery: Secondary | ICD-10-CM | POA: Diagnosis not present

## 2015-12-09 DIAGNOSIS — R52 Pain, unspecified: Secondary | ICD-10-CM

## 2015-12-09 DIAGNOSIS — O9089 Other complications of the puerperium, not elsewhere classified: Secondary | ICD-10-CM

## 2015-12-09 LAB — CBC
HEMATOCRIT: 36.9 % (ref 36.0–46.0)
HEMOGLOBIN: 12.8 g/dL (ref 12.0–15.0)
MCH: 31.1 pg (ref 26.0–34.0)
MCHC: 34.7 g/dL (ref 30.0–36.0)
MCV: 89.6 fL (ref 78.0–100.0)
PLATELETS: 286 10*3/uL (ref 150–400)
RBC: 4.12 MIL/uL (ref 3.87–5.11)
RDW: 14.1 % (ref 11.5–15.5)
WBC: 12.7 10*3/uL — AB (ref 4.0–10.5)

## 2015-12-09 LAB — TYPE AND SCREEN
ABO/RH(D): O POS
Antibody Screen: NEGATIVE

## 2015-12-09 LAB — ABO/RH: ABO/RH(D): O POS

## 2015-12-09 MED ORDER — LACTATED RINGERS IV SOLN: 500.0000 mL | INTRAVENOUS | Status: DC | PRN

## 2015-12-09 MED ORDER — CITRIC ACID-SODIUM CITRATE 334-500 MG/5ML PO SOLN: 30.0000 mL | ORAL | Status: DC | PRN

## 2015-12-09 MED ORDER — LIDOCAINE HCL (PF) 1 % IJ SOLN
30.0000 mL | INTRAMUSCULAR | Status: DC | PRN
  Filled 2015-12-09: qty 30

## 2015-12-09 MED ORDER — FLEET ENEMA 7-19 GM/118ML RE ENEM: 1.0000 | ENEMA | RECTAL | Status: DC | PRN

## 2015-12-09 MED ORDER — ONDANSETRON HCL 4 MG/2ML IJ SOLN
4.0000 mg | Freq: Four times a day (QID) | INTRAMUSCULAR | Status: DC | PRN
  Administered 2015-12-10: 4 mg via INTRAVENOUS
  Filled 2015-12-09: qty 2

## 2015-12-09 MED ORDER — ACETAMINOPHEN 325 MG PO TABS: 650.0000 mg | ORAL_TABLET | ORAL | Status: DC | PRN

## 2015-12-09 MED ORDER — OXYTOCIN 10 UNIT/ML IJ SOLN
2.5000 [IU]/h | INTRAVENOUS | Status: DC
  Filled 2015-12-09: qty 10

## 2015-12-09 MED ORDER — LACTATED RINGERS IV SOLN
INTRAVENOUS | Status: DC
  Administered 2015-12-09 – 2015-12-10 (×2): via INTRAVENOUS

## 2015-12-09 MED ORDER — OXYCODONE-ACETAMINOPHEN 5-325 MG PO TABS: 1.0000 | ORAL_TABLET | ORAL | Status: DC | PRN

## 2015-12-09 MED ORDER — BUTORPHANOL TARTRATE 1 MG/ML IJ SOLN
1.0000 mg | Freq: Once | INTRAMUSCULAR | Status: AC
  Administered 2015-12-09: 1 mg via INTRAVENOUS
  Filled 2015-12-09: qty 1

## 2015-12-09 MED ORDER — OXYCODONE-ACETAMINOPHEN 5-325 MG PO TABS: 2.0000 | ORAL_TABLET | ORAL | Status: DC | PRN

## 2015-12-09 MED ORDER — OXYTOCIN BOLUS FROM INFUSION: 500.0000 mL | INTRAVENOUS | Status: DC

## 2015-12-09 NOTE — MAU Note (Signed)
Contractions since 0430. Denies LOF or bleeding. [facility]

## 2015-12-09 NOTE — Discharge Instructions (Signed)
Braxton Hicks Contractions °Contractions of the uterus can occur throughout pregnancy. Contractions are not always a sign that you are in labor.  °WHAT ARE BRAXTON HICKS CONTRACTIONS?  °Contractions that occur before labor are called Braxton Hicks contractions, or false labor. Toward the end of pregnancy (32-34 weeks), these contractions can develop more often and may become more forceful. This is not true labor because these contractions do not result in opening (dilatation) and thinning of the cervix. They are sometimes difficult to tell apart from true labor because these contractions can be forceful and people have different pain tolerances. You should not feel embarrassed if you go to the hospital with false labor. Sometimes, the only way to tell if you are in true labor is for your health care provider to look for changes in the cervix. °If there are no prenatal problems or other health problems associated with the pregnancy, it is completely safe to be sent home with false labor and await the onset of true labor. °HOW CAN YOU TELL THE DIFFERENCE BETWEEN TRUE AND FALSE LABOR? °False Labor °· The contractions of false labor are usually shorter and not as hard as those of true labor.   °· The contractions are usually irregular.   °· The contractions are often felt in the front of the lower abdomen and in the groin.   °· The contractions may go away when you walk around or change positions while lying down.   °· The contractions get weaker and are shorter lasting as time goes on.   °· The contractions do not usually become progressively stronger, regular, and closer together as with true labor.   °True Labor °· Contractions in true labor last 30-70 seconds, become very regular, usually become more intense, and increase in frequency.   °· The contractions do not go away with walking.   °· The discomfort is usually felt in the top of the uterus and spreads to the lower abdomen and low back.   °· True labor can be  determined by your health care provider with an exam. This will show that the cervix is dilating and getting thinner.   °WHAT TO REMEMBER °· Keep up with your usual exercises and follow other instructions given by your health care provider.   °· Take medicines as directed by your health care provider.   °· Keep your regular prenatal appointments.   °· Eat and drink lightly if you think you are going into labor.   °· If Braxton Hicks contractions are making you uncomfortable:   °¨ Change your position from lying down or resting to walking, or from walking to resting.   °¨ Sit and rest in a tub of warm water.   °¨ Drink 2-3 glasses of water. Dehydration may cause these contractions.   °¨ Do slow and deep breathing several times an hour.   °WHEN SHOULD I SEEK IMMEDIATE MEDICAL CARE? °Seek immediate medical care if: °· Your contractions become stronger, more regular, and closer together.   °· You have fluid leaking or gushing from your vagina.   °· You have a fever.   °· You pass blood-tinged mucus.   °· You have vaginal bleeding.   °· You have continuous abdominal pain.   °· You have low back pain that you never had before.   °· You feel your baby's head pushing down and causing pelvic pressure.   °· Your baby is not moving as much as it used to.   °  °This information is not intended to replace advice given to you by your health care provider. Make sure you discuss any questions you have with your health care   provider. °  °Document Released: 08/30/2005 Document Revised: 09/04/2013 Document Reviewed: 06/11/2013 °Elsevier Interactive Patient Education ©2016 [employer] ° °

## 2015-12-09 NOTE — Progress Notes (Signed)
Written and verbal d/c instructions given and understanding voiced. 

## 2015-12-09 NOTE — Progress Notes (Signed)
Dr Marcelle OverlieHolland notified of pt's admission and status. Aware of ctx pattern, sve, FHR not reactive yet. Will watch pt and allow to ambulate once efm strip reactive and reck cervix

## 2015-12-09 NOTE — Progress Notes (Signed)
Dr Marcelle OverlieHolland notified of pt's admission and status. Aware of ctx pattern, sve, FHR reactive early on EFM but not currently. Will reck pt in an hour and if baby reactive and no active labor pt stable for d/c home

## 2015-12-09 NOTE — MAU Note (Addendum)
Was seen MAU earlier today. Ctxs closer and stronger. Denies LOF or bleeding

## 2015-12-09 NOTE — H&P (Signed)
Nicole Evans is a 20 y.o. female presenting for SOL. Maternal Medical History:  Contractions: Onset was 3-5 hours ago.   Frequency: regular.   Perceived severity is moderate.    Fetal activity: Perceived fetal activity is normal.      OB History    Gravida Para Term Preterm AB TAB SAB Ectopic Multiple Living   1              Past Medical History  Diagnosis Date  . Migraine    Past Surgical History  Procedure Laterality Date  . Appendectomy    . Cholecystectomy    . Wisdom tooth extraction     Family History: family history includes Fibromyalgia in her mother; Healthy in her father; Hypertension in her mother. Social History:  reports that she has never smoked. She has never used smokeless tobacco. She reports that she does not drink alcohol or use illicit drugs.   Prenatal Transfer Tool  Maternal Diabetes: No Genetic Screening: Normal Maternal Ultrasounds/Referrals: Normal Fetal Ultrasounds or other Referrals:  None Maternal Substance Abuse:  No Significant Maternal Medications:  None Significant Maternal Lab Results:  None Other Comments:  None  ROS  Dilation: 2 Effacement (%): 80 Station: -2 Exam by:: D Simpson RN Blood pressure 137/74, pulse 94, temperature 98.5 F (36.9 C), resp. rate 18, height 5\' 5"  (1.651 m), weight 172 lb (78.019 kg), last menstrual period 03/06/2015. Exam Physical Exam  Constitutional: She is oriented to person, place, and time. She appears well-developed and well-nourished.  HENT:  Head: Normocephalic and atraumatic.  Neck: Normal range of motion. Neck supple.  Cardiovascular: Normal rate and regular rhythm.   Respiratory: Effort normal and breath sounds normal.  GI:  Term FH  FHR 148  Genitourinary:  2/90/vtx/-2  Musculoskeletal: Normal range of motion.  Neurological: She is alert and oriented to person, place, and time.    Prenatal labs: ABO, Rh: --/--/O POS (03/28 2010) Antibody: NEG (03/28 2010) Rubella: Immune (09/23  0000) RPR: Nonreactive (09/23 0000)  HBsAg: Negative (09/23 0000)  HIV: Non-reactive (09/23 0000)  GBS: Negative (03/27 0000)   Assessment/Plan: Term IUP/Labor   Nicole Evans,Nicole Evans M 12/09/2015, 9:11 PM

## 2015-12-10 ENCOUNTER — Inpatient Hospital Stay (HOSPITAL_COMMUNITY): Payer: BLUE CROSS/BLUE SHIELD | Admitting: Anesthesiology

## 2015-12-10 ENCOUNTER — Encounter (HOSPITAL_COMMUNITY): Payer: Self-pay | Admitting: *Deleted

## 2015-12-10 LAB — RPR: RPR Ser Ql: NONREACTIVE

## 2015-12-10 MED ORDER — BENZOCAINE-MENTHOL 20-0.5 % EX AERO
1.0000 "application " | INHALATION_SPRAY | CUTANEOUS | Status: DC | PRN
  Administered 2015-12-10: 1 via TOPICAL
  Filled 2015-12-10 (×2): qty 56

## 2015-12-10 MED ORDER — LACTATED RINGERS IV SOLN
500.0000 mL | Freq: Once | INTRAVENOUS | Status: AC
  Administered 2015-12-10: 500 mL via INTRAVENOUS

## 2015-12-10 MED ORDER — ONDANSETRON HCL 4 MG PO TABS: 4.0000 mg | ORAL_TABLET | ORAL | Status: DC | PRN

## 2015-12-10 MED ORDER — FLEET ENEMA 7-19 GM/118ML RE ENEM: 1.0000 | ENEMA | Freq: Every day | RECTAL | Status: DC | PRN

## 2015-12-10 MED ORDER — MEASLES, MUMPS & RUBELLA VAC ~~LOC~~ INJ: 0.5000 mL | INJECTION | Freq: Once | SUBCUTANEOUS | Status: DC

## 2015-12-10 MED ORDER — PHENYLEPHRINE 40 MCG/ML (10ML) SYRINGE FOR IV PUSH (FOR BLOOD PRESSURE SUPPORT)
80.0000 ug | PREFILLED_SYRINGE | INTRAVENOUS | Status: DC | PRN
  Filled 2015-12-10: qty 20
  Filled 2015-12-10: qty 2

## 2015-12-10 MED ORDER — OXYTOCIN 10 UNIT/ML IJ SOLN
1.0000 m[IU]/min | INTRAVENOUS | Status: DC
  Administered 2015-12-10: 1 m[IU]/min via INTRAVENOUS

## 2015-12-10 MED ORDER — LANOLIN HYDROUS EX OINT: TOPICAL_OINTMENT | CUTANEOUS | Status: DC | PRN

## 2015-12-10 MED ORDER — SENNOSIDES-DOCUSATE SODIUM 8.6-50 MG PO TABS
2.0000 | ORAL_TABLET | ORAL | Status: DC
  Administered 2015-12-11 – 2015-12-12 (×3): 2 via ORAL
  Filled 2015-12-10 (×3): qty 2

## 2015-12-10 MED ORDER — EPHEDRINE 5 MG/ML INJ
10.0000 mg | INTRAVENOUS | Status: DC | PRN
  Filled 2015-12-10: qty 2

## 2015-12-10 MED ORDER — IBUPROFEN 600 MG PO TABS
600.0000 mg | ORAL_TABLET | Freq: Four times a day (QID) | ORAL | Status: DC
  Administered 2015-12-10 – 2015-12-13 (×12): 600 mg via ORAL
  Filled 2015-12-10 (×12): qty 1

## 2015-12-10 MED ORDER — MEDROXYPROGESTERONE ACETATE 150 MG/ML IM SUSP: 150.0000 mg | INTRAMUSCULAR | Status: DC | PRN

## 2015-12-10 MED ORDER — WITCH HAZEL-GLYCERIN EX PADS
1.0000 "application " | MEDICATED_PAD | CUTANEOUS | Status: DC | PRN
  Administered 2015-12-10: 1 via TOPICAL

## 2015-12-10 MED ORDER — BISACODYL 10 MG RE SUPP: 10.0000 mg | Freq: Every day | RECTAL | Status: DC | PRN

## 2015-12-10 MED ORDER — SIMETHICONE 80 MG PO CHEW: 80.0000 mg | CHEWABLE_TABLET | ORAL | Status: DC | PRN

## 2015-12-10 MED ORDER — DIBUCAINE 1 % RE OINT: 1.0000 "application " | TOPICAL_OINTMENT | RECTAL | Status: DC | PRN

## 2015-12-10 MED ORDER — DIPHENHYDRAMINE HCL 25 MG PO CAPS: 25.0000 mg | ORAL_CAPSULE | Freq: Four times a day (QID) | ORAL | Status: DC | PRN

## 2015-12-10 MED ORDER — ACETAMINOPHEN 325 MG PO TABS
650.0000 mg | ORAL_TABLET | ORAL | Status: DC | PRN
  Administered 2015-12-10 – 2015-12-12 (×3): 650 mg via ORAL
  Filled 2015-12-10 (×3): qty 2

## 2015-12-10 MED ORDER — LIDOCAINE HCL (PF) 1 % IJ SOLN
INTRAMUSCULAR | Status: DC | PRN
  Administered 2015-12-10: 2 mL via EPIDURAL
  Administered 2015-12-10: 3 mL via EPIDURAL
  Administered 2015-12-10: 5 mL via EPIDURAL

## 2015-12-10 MED ORDER — ZOLPIDEM TARTRATE 5 MG PO TABS: 5.0000 mg | ORAL_TABLET | Freq: Every evening | ORAL | Status: DC | PRN

## 2015-12-10 MED ORDER — TERBUTALINE SULFATE 1 MG/ML IJ SOLN
0.2500 mg | Freq: Once | INTRAMUSCULAR | Status: DC | PRN
  Filled 2015-12-10: qty 1

## 2015-12-10 MED ORDER — ONDANSETRON HCL 4 MG/2ML IJ SOLN: 4.0000 mg | INTRAMUSCULAR | Status: DC | PRN

## 2015-12-10 MED ORDER — DIPHENHYDRAMINE HCL 50 MG/ML IJ SOLN: 12.5000 mg | INTRAMUSCULAR | Status: DC | PRN

## 2015-12-10 MED ORDER — PRENATAL MULTIVITAMIN CH
1.0000 | ORAL_TABLET | Freq: Every day | ORAL | Status: DC
  Administered 2015-12-10 – 2015-12-12 (×3): 1 via ORAL
  Filled 2015-12-10 (×3): qty 1

## 2015-12-10 MED ORDER — FENTANYL 2.5 MCG/ML BUPIVACAINE 1/10 % EPIDURAL INFUSION (WH - ANES)
14.0000 mL/h | INTRAMUSCULAR | Status: DC | PRN
  Administered 2015-12-10: 14 mL/h via EPIDURAL
  Filled 2015-12-10: qty 125

## 2015-12-10 MED ORDER — TETANUS-DIPHTH-ACELL PERTUSSIS 5-2.5-18.5 LF-MCG/0.5 IM SUSP: 0.5000 mL | Freq: Once | INTRAMUSCULAR | Status: DC

## 2015-12-10 MED ORDER — PHENYLEPHRINE 40 MCG/ML (10ML) SYRINGE FOR IV PUSH (FOR BLOOD PRESSURE SUPPORT)
80.0000 ug | PREFILLED_SYRINGE | INTRAVENOUS | Status: DC | PRN
  Filled 2015-12-10: qty 2
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
## 2015-12-10 NOTE — Anesthesia Postprocedure Evaluation (Signed)
Anesthesia Post Note  Patient: Nicole Evans  Procedure(s) Performed: * No procedures listed *  Patient location during evaluation: Mother Baby Anesthesia Type: Epidural Level of consciousness: awake and alert Pain management: pain level controlled Vital Signs Assessment: post-procedure vital signs reviewed and stable Respiratory status: spontaneous breathing, nonlabored ventilation and respiratory function stable Cardiovascular status: stable Postop Assessment: no headache, no backache and epidural receding Anesthetic complications: no    Last Vitals:  Filed Vitals:   12/10/15 1100 12/10/15 1230  BP: 128/71 117/51  Pulse: 79 74  Temp: 36.7 C 36.8 C  Resp: 16 18    Last Pain:  Filed Vitals:   12/10/15 1310  PainSc: 0-No pain                 Caridad Silveira

## 2015-12-10 NOTE — Progress Notes (Signed)
Patient comfortable with epidural  FHR Category 2 Good variability but decels / variable  Cervix is now C/ 10/ +2 I have instructed the nurse to start pushing Monitor FHR closely

## 2015-12-10 NOTE — Anesthesia Preprocedure Evaluation (Signed)
Anesthesia Evaluation  Patient identified by MRN, date of birth, ID band Patient awake    Reviewed: Allergy & Precautions, NPO status , Patient's Chart, lab work & pertinent test results  History of Anesthesia Complications Negative for: history of anesthetic complications  Airway Mallampati: II  TM Distance: >3 FB Neck ROM: Full    Dental  (+) Teeth Intact, Dental Advisory Given   Pulmonary neg pulmonary ROS,    Pulmonary exam normal breath sounds clear to auscultation       Cardiovascular Exercise Tolerance: Good negative cardio ROS Normal cardiovascular exam Rhythm:Regular Rate:Normal     Neuro/Psych  Headaches, negative psych ROS   GI/Hepatic negative GI ROS, Neg liver ROS,   Endo/Other  negative endocrine ROS  Renal/GU negative Renal ROS     Musculoskeletal negative musculoskeletal ROS (+)   Abdominal   Peds  Hematology negative hematology ROS (+) Plt 286k   Anesthesia Other Findings Day of surgery medications reviewed with the patient.  Reproductive/Obstetrics (+) Pregnancy G1                              Anesthesia Physical Anesthesia Plan  ASA: II  Anesthesia Plan: Epidural   Post-op Pain Management:    Induction:   Airway Management Planned:   Additional Equipment:   Intra-op Plan:   Post-operative Plan:   Informed Consent: I have reviewed the patients History and Physical, chart, labs and discussed the procedure including the risks, benefits and alternatives for the proposed anesthesia with the patient or authorized representative who has indicated his/her understanding and acceptance.   Dental advisory given  Plan Discussed with:   Anesthesia Plan Comments: (Patient identified. Risks/Benefits/Options discussed with patient including but not limited to bleeding, infection, nerve damage, paralysis, failed block, incomplete pain control, headache, blood  pressure changes, nausea, vomiting, reactions to medication both or allergic, itching and postpartum back pain. Confirmed with bedside nurse the patient's most recent platelet count. Confirmed with patient that they are not currently taking any anticoagulation, have any bleeding history or any family history of bleeding disorders. Patient expressed understanding and wished to proceed. All questions were answered. )        Anesthesia Quick Evaluation

## 2015-12-10 NOTE — Anesthesia Procedure Notes (Signed)
Epidural Patient location during procedure: OB  Staffing Anesthesiologist: Emberli Ballester EDWARD Performed by: anesthesiologist   Preanesthetic Checklist Completed: patient identified, pre-op evaluation, timeout performed, IV checked, risks and benefits discussed and monitors and equipment checked  Epidural Patient position: sitting Prep: DuraPrep Patient monitoring: blood pressure and continuous pulse ox Approach: midline Location: L3-L4 Injection technique: LOR air  Needle:  Needle type: Tuohy  Needle gauge: 17 G Needle length: 9 cm Needle insertion depth: 5 cm Catheter size: 19 Gauge Catheter at skin depth: 10 cm Test dose: negative and Other (1% Lidocaine)  Additional Notes Patient identified.  Risk benefits discussed including failed block, incomplete pain control, headache, nerve damage, paralysis, blood pressure changes, nausea, vomiting, reactions to medication both toxic or allergic, and postpartum back pain.  Patient expressed understanding and wished to proceed.  All questions were answered.  Sterile technique used throughout procedure and epidural site dressed with sterile barrier dressing. No paresthesia or other complications noted. The patient did not experience any signs of intravascular injection such as tinnitus or metallic taste in mouth nor signs of intrathecal spread such as rapid motor block. Please see nursing notes for vital signs. Reason for block:procedure for pain   

## 2015-12-10 NOTE — Lactation Note (Signed)
This note was copied from a baby's chart. Lactation Consultation Note  Patient Name: Nicole Evans ZOXWR'UToday's Date: 12/10/2015 Reason for consult: Initial assessment Baby at 9 hr of life and mom reports bf is going well. She denies breast or nipple pain. Answered questions about pumping and when to offer a bottle of her milk. Mom was holding baby in cradle position and letting baby just grab the tip of the nipple. Demonstrated to parents how to bring the baby in close with head/neck support and breast support. Discussed baby behavior, feeding frequency, baby belly size, voids, wt loss, breast changes, and nipple care. Mom stated that she can manually express, has seen colostrum bilaterally, and has a spoon in the room. Given lactation handouts. Aware of OP services and support group.     Maternal Data    Feeding Feeding Type: Breast Fed Length of feed: 5 min  LATCH Score/Interventions Latch: Repeated attempts needed to sustain latch, nipple held in mouth throughout feeding, stimulation needed to elicit sucking reflex. Intervention(s): Adjust position;Assist with latch;Breast compression  Audible Swallowing: Spontaneous and intermittent Intervention(s): Skin to skin;Hand expression  Type of Nipple: Everted at rest and after stimulation  Comfort (Breast/Nipple): Soft / non-tender     Hold (Positioning): Assistance needed to correctly position infant at breast and maintain latch. Intervention(s): Support Pillows;Position options  LATCH Score: 8  Lactation Tools Discussed/Used WIC Program: No   Consult Status Consult Status: Follow-up Date: 12/11/15 Follow-up type: In-patient    Rulon Eisenmengerlizabeth E Daneisha Surges 12/10/2015, 6:01 PM

## 2015-12-11 ENCOUNTER — Inpatient Hospital Stay (HOSPITAL_COMMUNITY): Admission: RE | Admit: 2015-12-11 | Payer: No Typology Code available for payment source | Source: Ambulatory Visit

## 2015-12-11 ENCOUNTER — Inpatient Hospital Stay (HOSPITAL_COMMUNITY): Payer: BLUE CROSS/BLUE SHIELD

## 2015-12-11 LAB — CBC
HCT: 31.1 % — ABNORMAL LOW (ref 36.0–46.0)
HEMOGLOBIN: 10.6 g/dL — AB (ref 12.0–15.0)
MCH: 30.9 pg (ref 26.0–34.0)
MCHC: 34.1 g/dL (ref 30.0–36.0)
MCV: 90.7 fL (ref 78.0–100.0)
Platelets: 255 10*3/uL (ref 150–400)
RBC: 3.43 MIL/uL — AB (ref 3.87–5.11)
RDW: 14.5 % (ref 11.5–15.5)
WBC: 17.7 10*3/uL — AB (ref 4.0–10.5)

## 2015-12-11 MED ORDER — CLINDAMYCIN HCL 300 MG PO CAPS
300.0000 mg | ORAL_CAPSULE | Freq: Three times a day (TID) | ORAL | Status: DC
  Administered 2015-12-11: 300 mg via ORAL
  Filled 2015-12-11 (×4): qty 1

## 2015-12-11 MED ORDER — HYDROMORPHONE HCL 1 MG/ML IJ SOLN: 1.0000 mg | INTRAMUSCULAR | Status: DC | PRN

## 2015-12-11 MED ORDER — CLINDAMYCIN PHOSPHATE 900 MG/50ML IV SOLN
900.0000 mg | Freq: Three times a day (TID) | INTRAVENOUS | Status: DC
  Administered 2015-12-11 – 2015-12-13 (×5): 900 mg via INTRAVENOUS
  Filled 2015-12-11 (×6): qty 50

## 2015-12-11 MED ORDER — OXYCODONE-ACETAMINOPHEN 5-325 MG PO TABS
1.0000 | ORAL_TABLET | ORAL | Status: DC | PRN
  Administered 2015-12-11: 2 via ORAL
  Filled 2015-12-11: qty 2

## 2015-12-11 MED ORDER — GENTAMICIN SULFATE 40 MG/ML IJ SOLN
7.0000 mg/kg | INTRAVENOUS | Status: DC
  Administered 2015-12-11 – 2015-12-12 (×2): 550 mg via INTRAVENOUS
  Filled 2015-12-11 (×2): qty 13.75

## 2015-12-11 NOTE — Progress Notes (Signed)
Patient ID: Nicole Evans, female   DOB: 02/20/1996, 19 y.o.   MRN: [id number]    RN called with pt fever to 103.1.  Normal breast exam and mild uterine tenderness.  Urine C&S sent and oral clindamycin started for presumed pp endometritis. I was called several hours later as patient complained of severe pelvic cramping, and writhing.  RN placed foley and only 250cc drained.  Pelvic US ordered and it was normal without evidence of retained POC, and normal ovaries.  This mornings WBC was 17.7      03/29 0700 03/30 0659 03/30 0700 03/30 2127   Most Recent       Temp (F)     98-98.9 99.3-103.1  99.3 (37.4)  03/30 2015   Pulse Rate     70-92 117-139   117  03/30 2015   Resp     16-18 16-20  20  03/30 2015   BP     109/72-140/76 105/57-126/69   105/57  03/30 2015   SpO2 (%)     98-99 100  100  03/30 2015         Moderated uterine tenderness, vaginal bleeding scant   Presumed PP endometritis - will change to IV Gent and clinda.  Repeat CBC in am DL

## 2015-12-11 NOTE — Progress Notes (Signed)
Post Partum Day 1 Subjective: no complaints, up ad lib, voiding, tolerating PO and + flatus  Objective: Blood pressure 115/54, pulse 89, temperature 98.4 F (36.9 C), temperature source Oral, resp. rate 18, height 5\' 5"  (1.651 m), weight 172 lb (78.019 kg), last menstrual period 03/06/2015, SpO2 98 %, unknown if currently breastfeeding.  Physical Exam:  General: alert and cooperative Lochia: appropriate Uterine Fundus: firm Incision: healing well, small labial edema noted DVT Evaluation: No evidence of DVT seen on physical exam. Negative Homan's sign. No cords or calf tenderness. No significant calf/ankle edema.   Recent Labs  12/09/15 2010 12/11/15 0539  HGB 12.8 10.6*  HCT 36.9 31.1*    Assessment/Plan: Plan for discharge tomorrow   LOS: 2 days   CURTIS,CAROL G 12/11/2015, 8:01 AM

## 2015-12-11 NOTE — Progress Notes (Signed)
Patient called out with severe pain. Stedy used to get pt back to bed. Unable to lay on back for fundal exam. Dr. Rana SnareLowe informed and wanted In and Out cath. Also received order for Percocet for increased pain. Got out 250 ml from I/O cath. Pelvic ultrasound was ordered if output was low.

## 2015-12-11 NOTE — Progress Notes (Signed)
Patient called RN in room since she had chills. Temp. 103.1. Pulse 138. C/o slight headache. Fundus only slightly tender. No URI symptoms. Called Dr. Rana SnareLowe. Urine culture via clean catch obtained. Started patient on Clindamycin 300mg  po tid @ 1600.

## 2015-12-12 LAB — CBC
HCT: 30.1 % — ABNORMAL LOW (ref 36.0–46.0)
HEMATOCRIT: 30.1 % — AB (ref 36.0–46.0)
Hemoglobin: 10.2 g/dL — ABNORMAL LOW (ref 12.0–15.0)
Hemoglobin: 10.3 g/dL — ABNORMAL LOW (ref 12.0–15.0)
MCH: 30.6 pg (ref 26.0–34.0)
MCH: 30.8 pg (ref 26.0–34.0)
MCHC: 33.9 g/dL (ref 30.0–36.0)
MCHC: 34.2 g/dL (ref 30.0–36.0)
MCV: 90.4 fL (ref 78.0–100.0)
MCV: 90.1 fL (ref 78.0–100.0)
PLATELETS: 199 10*3/uL (ref 150–400)
PLATELETS: 202 10*3/uL (ref 150–400)
RBC: 3.33 MIL/uL — ABNORMAL LOW (ref 3.87–5.11)
RBC: 3.34 MIL/uL — ABNORMAL LOW (ref 3.87–5.11)
RDW: 14.4 % (ref 11.5–15.5)
RDW: 14.4 % (ref 11.5–15.5)
WBC: 21.6 10*3/uL — ABNORMAL HIGH (ref 4.0–10.5)
WBC: 22.3 10*3/uL — ABNORMAL HIGH (ref 4.0–10.5)

## 2015-12-12 LAB — URINE CULTURE
Culture: 5000
SPECIAL REQUESTS: NORMAL

## 2015-12-12 LAB — CREATININE, SERUM
CREATININE: 0.91 mg/dL (ref 0.44–1.00)
GFR calc Af Amer: >60 mL/min (ref >60)

## 2015-12-12 NOTE — Progress Notes (Signed)
Post Partum Day 2 Subjective: voiding, tolerating PO, + flatus and complains of uterine tenderness, denies dysuria, dysphagia, or breast tenderness  Objective: Blood pressure 112/57, pulse 94, temperature 99 F (37.2 C), temperature source Oral, resp. rate 20, height 5\' 5"  (1.651 m), weight 172 lb (78.019 kg), last menstrual period 03/06/2015, SpO2 100 %, unknown if currently breastfeeding.  Physical Exam:  General: alert and cooperative Lochia: appropriate Uterine Fundus: firm , U-1, tender to palpation Incision: healing well DVT Evaluation: No evidence of DVT seen on physical exam. Negative Homan's sign. No cords or calf tenderness. Calf/Ankle edema is present.   Recent Labs  12/11/15 0539 12/12/15 0533  HGB 10.6* 10.2*  HCT 31.1* 30.1*    Assessment/Plan: PP endometritis Continue IV antibiotics, repeat CBC @2    LOS: 3 days   Nicole Evans G 12/12/2015, 8:14 AM

## 2015-12-12 NOTE — Addendum Note (Signed)
Addendum  created 12/12/15 0702 by Armanda Heritageharlesetta M Karlin Heilman, CRNA   Modules edited: Anesthesia LDA, Charges VN, Lines/Drains/Airways Properties Editor   Lines/Drains/Airways Properties Editor:  Properties of line/drain/airway/wound Peripheral IV 12/12/15 Right Wrist have been modified.

## 2015-12-12 NOTE — Progress Notes (Signed)
Patient complains of some tightness in chest, no shortness of breath. BP 125/67, pulse 97, resp  18; Lungs clear  bilaterally. Abdomen soft, distended, bowel sounds audible all 4 quadrants. Encouraged to ambulate in room and short distance in hallway to improve flatus. Will monitor closely

## 2015-12-12 NOTE — Lactation Note (Signed)
This note was copied from a baby's chart. Lactation Consultation Note  Patient Name: Nicole Evans XBMWU'XToday's Date: 12/12/2015 Reason for consult: Follow-up assessment   With this first time mom of a term baby, now 3651 hours old. Mom has formula fed baby her last 3 feedings, because mom states "I was in pain" from the uterine cramping. On exam, mom's breast were very full but soft. I gave mom a manual hand pump, and she was able to express lots of milk, with a steady flow. I reviewed with mom how she makes milk, and how breastfeeding causes cramping to get her uterus tight and prevent bleeding. Mom reports not having any trouble latching her baby, but knows to call for lactation as needed. I told mom to breastfeed with cues, and if she had more milk than her baby would need to supplement after breastfeeding, we could refrigerate it for her. Mom seemed pleased at her supply, and motivated to continue with breastfeeding. Dad and MGM also very supportive of mom breastfeeding.     Maternal Data    Feeding Feeding Type: Breast Fed  LATCH Score/Interventions Latch: Grasps breast easily, tongue down, lips flanged, rhythmical sucking.  Audible Swallowing: A few with stimulation  Type of Nipple: Everted at rest and after stimulation  Comfort (Breast/Nipple): Filling, red/small blisters or bruises, mild/mod discomfort  Problem noted: Filling Interventions (Filling): Hand pump  Hold (Positioning): Assistance needed to correctly position infant at breast and maintain latch. Intervention(s): Breastfeeding basics reviewed  LATCH Score: 8  Lactation Tools Discussed/Used     Consult Status Consult Status: Follow-up Date: 12/13/15 Follow-up type: In-patient    Nicole Evans, Nicole Evans 12/12/2015, 12:44 PM

## 2015-12-13 MED ORDER — OXYCODONE-ACETAMINOPHEN 5-325 MG PO TABS: 1.0000 | ORAL_TABLET | ORAL | Status: DC | PRN

## 2015-12-13 MED ORDER — IBUPROFEN 600 MG PO TABS: 600.0000 mg | ORAL_TABLET | Freq: Four times a day (QID) | ORAL | Status: DC

## 2015-12-13 MED ORDER — CLINDAMYCIN HCL 300 MG PO CAPS: 900.0000 mg | ORAL_CAPSULE | Freq: Three times a day (TID) | ORAL | Status: DC

## 2015-12-13 NOTE — Discharge Summary (Signed)
Obstetric Discharge Summary Reason for Admission: onset of labor Prenatal Procedures: ultrasound Intrapartum Procedures: vacuum Postpartum Procedures: antibiotics Complications-Operative and Postpartum: 2 degree perineal laceration HEMOGLOBIN  Date Value Ref Range Status  12/12/2015 10.3* 12.0 - 15.0 g/dL Final   HCT  Date Value Ref Range Status  12/12/2015 30.1* 36.0 - 46.0 % Final   HEMATOCRIT  Date Value Ref Range Status  04/03/2015 33.3* 34.0 - 46.6 % Final    Physical Exam:  General: alert and cooperative Lochia: appropriate Uterine Fundus: firm Incision: healing well, no significant drainage DVT Evaluation: No evidence of DVT seen on physical exam.  Discharge Diagnoses: Term Pregnancy-delivered  Discharge Information: Date: 12/13/2015 Activity: pelvic rest Diet: routine Medications: PNV, Ibuprofen, Percocet and clinda Condition: stable Instructions: refer to practice specific booklet Discharge to: home Follow-up Information    Schedule an appointment as soon as possible for a visit in 1 week to follow up.      Newborn Data: Live born female  Birth Weight: 6 lb 11.4 oz (3045 g) APGAR: 8, 9  Home with mother.  Nicole Evans 12/13/2015, 8:28 AM

## 2016-09-13 NOTE — L&D Delivery Note (Signed)
Delivery Note At 6:36 PM a viable female was delivered via Vaginal, Spontaneous Delivery (Presentation: ROA).  APGARs pending. weight pending.   Placenta status: S/I/3V Cord  with the following complications: none.  Cord pH: n/a  Anesthesia:  CLEA Episiotomy: None Lacerations: None Suture Repair: n/a Est. Blood Loss (mL): 150  Mom to postpartum.  Baby to Couplet care / Skin to Skin.  Idris Edmundson 07/12/2017, 6:49 PM

## 2016-11-20 ENCOUNTER — Encounter (HOSPITAL_COMMUNITY): Payer: Self-pay | Admitting: *Deleted

## 2016-11-20 ENCOUNTER — Inpatient Hospital Stay (HOSPITAL_COMMUNITY)
Admission: AD | Admit: 2016-11-20 | Discharge: 2016-11-20 | Disposition: A | Discharge status: Home / Self Care | Payer: BLUE CROSS/BLUE SHIELD | Source: Ambulatory Visit | Attending: Obstetrics and Gynecology | Admitting: Obstetrics and Gynecology

## 2016-11-20 ENCOUNTER — Inpatient Hospital Stay (HOSPITAL_COMMUNITY): Payer: BLUE CROSS/BLUE SHIELD

## 2016-11-20 DIAGNOSIS — O26891 Other specified pregnancy related conditions, first trimester: Secondary | ICD-10-CM | POA: Diagnosis not present

## 2016-11-20 DIAGNOSIS — Z3A09 9 weeks gestation of pregnancy: Secondary | ICD-10-CM | POA: Insufficient documentation

## 2016-11-20 DIAGNOSIS — Z880 Allergy status to penicillin: Secondary | ICD-10-CM | POA: Insufficient documentation

## 2016-11-20 DIAGNOSIS — Z3491 Encounter for supervision of normal pregnancy, unspecified, first trimester: Secondary | ICD-10-CM

## 2016-11-20 DIAGNOSIS — R109 Unspecified abdominal pain: Secondary | ICD-10-CM | POA: Diagnosis present

## 2016-11-20 DIAGNOSIS — O210 Mild hyperemesis gravidarum: Secondary | ICD-10-CM | POA: Diagnosis not present

## 2016-11-20 DIAGNOSIS — O9989 Other specified diseases and conditions complicating pregnancy, childbirth and the puerperium: Secondary | ICD-10-CM | POA: Diagnosis not present

## 2016-11-20 DIAGNOSIS — O219 Vomiting of pregnancy, unspecified: Secondary | ICD-10-CM

## 2016-11-20 DIAGNOSIS — O26899 Other specified pregnancy related conditions, unspecified trimester: Secondary | ICD-10-CM

## 2016-11-20 HISTORY — DX: Personal history of other infectious and parasitic diseases: Z86.19

## 2016-11-20 LAB — CBC
HCT: 34.6 % — ABNORMAL LOW (ref 36.0–46.0)
Hemoglobin: 11.6 g/dL — ABNORMAL LOW (ref 12.0–15.0)
MCH: 29.4 pg (ref 26.0–34.0)
MCHC: 33.5 g/dL (ref 30.0–36.0)
MCV: 87.8 fL (ref 78.0–100.0)
PLATELETS: 342 10*3/uL (ref 150–400)
RBC: 3.94 MIL/uL (ref 3.87–5.11)
RDW: 14.1 % (ref 11.5–15.5)
WBC: 3.9 10*3/uL — ABNORMAL LOW (ref 4.0–10.5)

## 2016-11-20 LAB — POCT PREGNANCY, URINE: Preg Test, Ur: POSITIVE — AB

## 2016-11-20 LAB — WET PREP, GENITAL
CLUE CELLS WET PREP: NONE SEEN
Sperm: NONE SEEN
Trich, Wet Prep: NONE SEEN
Yeast Wet Prep HPF POC: NONE SEEN

## 2016-11-20 LAB — URINALYSIS, ROUTINE W REFLEX MICROSCOPIC
BILIRUBIN URINE: NEGATIVE
GLUCOSE, UA: NEGATIVE mg/dL
HGB URINE DIPSTICK: NEGATIVE
KETONES UR: NEGATIVE mg/dL
LEUKOCYTES UA: NEGATIVE
Nitrite: NEGATIVE
PROTEIN: NEGATIVE mg/dL
Specific Gravity, Urine: 1.024 (ref 1.005–1.030)
pH: 6 (ref 5.0–8.0)

## 2016-11-20 LAB — HCG, QUANTITATIVE, PREGNANCY: HCG, BETA CHAIN, QUANT, S: 63624 m[IU]/mL — AB (ref <5)

## 2016-11-20 LAB — HIV ANTIBODY (ROUTINE TESTING W REFLEX): HIV SCREEN 4TH GENERATION: NONREACTIVE

## 2016-11-20 MED ORDER — PROMETHAZINE HCL 25 MG PO TABS: 25.0000 mg | ORAL_TABLET | Freq: Four times a day (QID) | ORAL | 0 refills | Status: DC | PRN

## 2016-11-20 NOTE — Discharge Instructions (Signed)
Abdominal Pain During Pregnancy Belly (abdominal) pain is common during pregnancy. Most of the time, it is not a serious problem. Other times, it can be a sign that something is wrong with the pregnancy. Always tell your doctor if you have belly pain. Follow these instructions at home: Monitor your belly pain for any changes. The following actions may help you feel better:  Do not have sex (intercourse) or put anything in your vagina until you feel better.  Rest until your pain stops.  Drink clear fluids if you feel sick to your stomach (nauseous). Do not eat solid food until you feel better.  Only take medicine as told by your doctor.  Keep all doctor visits as told. Get help right away if:  You are bleeding, leaking fluid, or pieces of tissue come out of your vagina.  You have more pain or cramping.  You keep throwing up (vomiting).  You have pain when you pee (urinate) or have blood in your pee.  You have a fever.  You do not feel your baby moving as much.  You feel very weak or feel like passing out.  You have trouble breathing, with or without belly pain.  You have a very bad headache and belly pain.  You have fluid leaking from your vagina and belly pain.  You keep having watery poop (diarrhea).  Your belly pain does not go away after resting, or the pain gets worse. This information is not intended to replace advice given to you by your health care provider. Make sure you discuss any questions you have with your health care provider. Document Released: 08/18/2009 Document Revised: 04/07/2016 Document Reviewed: 03/29/2013 Elsevier Interactive Patient Education  2017 [employer] Inc[employer]  Morning Sickness Morning sickness is when you feel sick to your stomach (nauseous) during pregnancy. This nauseous feeling may or may not come with vomiting. It often occurs in the morning but can be a problem any time of day. Morning sickness is most common during the first trimester, but  it may continue throughout pregnancy. While morning sickness is unpleasant, it is usually harmless unless you develop severe and continual vomiting (hyperemesis gravidarum). This condition requires more intense treatment. What are the causes? The cause of morning sickness is not completely known but seems to be related to normal hormonal changes that occur in pregnancy. What increases the risk? You are at greater risk if you:  Experienced nausea or vomiting before your pregnancy.  Had morning sickness during a previous pregnancy.  Are pregnant with more than one baby, such as twins. How is this treated? Do not use any medicines (prescription, over-the-counter, or herbal) for morning sickness without first talking to your health care provider. Your health care provider may prescribe or recommend:  Vitamin B6 supplements.  Anti-nausea medicines.  The herbal medicine ginger. Follow these instructions at home:  Only take over-the-counter or prescription medicines as directed by your health care provider.  Taking multivitamins before getting pregnant can prevent or decrease the severity of morning sickness in most women.  Eat a piece of dry toast or unsalted crackers before getting out of bed in the morning.  Eat five or six small meals a day.  Eat dry and bland foods (rice, baked potato). Foods high in carbohydrates are often helpful.  Do not drink liquids with your meals. Drink liquids between meals.  Avoid greasy, fatty, and spicy foods.  Get someone to cook for you if the smell of any food causes nausea and vomiting.  If you feel nauseous after taking prenatal vitamins, take the vitamins at night or with a snack.  Snack on protein foods (nuts, yogurt, cheese) between meals if you are hungry.  Eat unsweetened gelatins for desserts.  Wearing an acupressure wristband (worn for sea sickness) may be helpful.  Acupuncture may be helpful.  Do not smoke.  Get a humidifier to  keep the air in your house free of odors.  Get plenty of fresh air. Contact a health care provider if:  Your home remedies are not working, and you need medicine.  You feel dizzy or lightheaded.  You are losing weight. Get help right away if:  You have persistent and uncontrolled nausea and vomiting.  You pass out (faint). This information is not intended to replace advice given to you by your health care provider. Make sure you discuss any questions you have with your health care provider. Document Released: 10/21/2006 Document Revised: 02/05/2016 Document Reviewed: 02/14/2013 Elsevier Interactive Patient Education  2017 [employer]

## 2016-11-20 NOTE — MAU Provider Note (Signed)
History     CSN: [id number]  Arrival date and time: 11/20/16 [id number]   First Provider Initiated Contact with Patient 11/20/16 [phone]      Chief Complaint  Patient presents with  . Abdominal Pain   HPI Nicole Evans is a 21 y.o. G2P1001 at 3244w4d by LMP who presents with abdominal pain. Had IUD removed last month & immediately started on OCPs, which she has been taking daily. Has positive pregnancy test earlier this week. Reports mid abdominal pain x 2 days. Pain is constant & describes as cramping & sharp. Rates pain 6/10. Has not treated. Nothing makes better or worse. Denies vaginal bleeding, dysuria, diarrhea, fever, or constipation. Last BM was yesterday. Endorses n/v; vomited twice in the last 24 hours. Some white mucoid vaginal discharge; no odor or itching.   OB History    Gravida Para Term Preterm AB Living   2 1 1     1    SAB TAB Ectopic Multiple Live Births         0 1      Past Medical History:  Diagnosis Date  . Hx of chlamydia infection 2014  . Migraine     Past Surgical History:  Procedure Laterality Date  . APPENDECTOMY    . CHOLECYSTECTOMY    . WISDOM TOOTH EXTRACTION      Family History  Problem Relation Age of Onset  . Hypertension Mother   . Fibromyalgia Mother   . Healthy Father     Social History  Substance Use Topics  . Smoking status: Never Smoker  . Smokeless tobacco: Never Used  . Alcohol use No    Allergies:  Allergies  Allergen Reactions  . Penicillins Anaphylaxis, Rash and Other (See Comments)    Has patient had a PCN reaction causing immediate rash, facial/tongue/throat swelling, SOB or lightheadedness with hypotension: Yes Has patient had a PCN reaction causing severe rash involving mucus membranes or skin necrosis: No Has patient had a PCN reaction that required hospitalization No Has patient had a PCN reaction occurring within the last 10 years: No If all of the above answers are "NO", then may proceed with Cephalosporin use.     Prescriptions Prior to Admission  Medication Sig Dispense Refill Last Dose  . clindamycin (CLEOCIN) 300 MG capsule Take 3 capsules (900 mg total) by mouth 3 (three) times daily. 63 capsule 0   . ibuprofen (ADVIL,MOTRIN) 600 MG tablet Take 1 tablet (600 mg total) by mouth every 6 (six) hours. 30 tablet 0   . oxyCODONE-acetaminophen (PERCOCET/ROXICET) 5-325 MG tablet Take 1-2 tablets by mouth every 4 (four) hours as needed for moderate pain or severe pain. 15 tablet 0   . Prenatal Vit-Fe Fumarate-FA (PRENATAL MULTIVITAMIN) TABS tablet Take 1 tablet by mouth at bedtime.   12/08/2015 at Unknown time    Review of Systems  Constitutional: Negative.   Gastrointestinal: Positive for abdominal pain, nausea and vomiting. Negative for constipation and diarrhea.  Genitourinary: Positive for vaginal discharge. Negative for dyspareunia, dysuria and vaginal bleeding.   Physical Exam   Blood pressure 121/71, pulse 82, temperature 98.7 F (37.1 C), resp. rate 16, height 5\' 5"  (1.651 m), weight 123 lb (55.8 kg), last menstrual period 09/14/2016, unknown if currently breastfeeding.  Physical Exam  Nursing note and vitals reviewed. Constitutional: She is oriented to person, place, and time. She appears well-developed and well-nourished. No distress.  HENT:  Head: Normocephalic and atraumatic.  Eyes: Conjunctivae are normal. Right eye exhibits no discharge. Left eye  exhibits no discharge. No scleral icterus.  Neck: Normal range of motion.  Respiratory: Effort normal. No respiratory distress.  GI: Soft. Bowel sounds are normal. She exhibits no distension. There is no tenderness. There is no rebound and no guarding.  Genitourinary: Uterus normal. Cervix exhibits no motion tenderness, no discharge and no friability. Right adnexum displays no mass and no tenderness. Left adnexum displays no mass and no tenderness. No bleeding in the vagina. Vaginal discharge (minimal amount of thin white discharge) found.   Genitourinary Comments: Cervix closed  Neurological: She is alert and oriented to person, place, and time.  Skin: Skin is warm and dry. She is not diaphoretic.  Psychiatric: She has a normal mood and affect. Her behavior is normal. Judgment and thought content normal.    MAU Course  Procedures Results for orders placed or performed during the hospital encounter of 11/20/16 (from the past 24 hour(s))  Urinalysis, Routine w reflex microscopic     Status: None   Collection Time: 11/20/16  6:20 AM  Result Value Ref Range   Color, Urine YELLOW YELLOW   APPearance CLEAR CLEAR   Specific Gravity, Urine 1.024 1.005 - 1.030   pH 6.0 5.0 - 8.0   Glucose, UA NEGATIVE NEGATIVE mg/dL   Hgb urine dipstick NEGATIVE NEGATIVE   Bilirubin Urine NEGATIVE NEGATIVE   Ketones, ur NEGATIVE NEGATIVE mg/dL   Protein, ur NEGATIVE NEGATIVE mg/dL   Nitrite NEGATIVE NEGATIVE   Leukocytes, UA NEGATIVE NEGATIVE  Pregnancy, urine POC     Status: Abnormal   Collection Time: 11/20/16  6:34 AM  Result Value Ref Range   Preg Test, Ur POSITIVE (A) NEGATIVE  CBC     Status: Abnormal   Collection Time: 11/20/16  6:54 AM  Result Value Ref Range   WBC 3.9 (L) 4.0 - 10.5 K/uL   RBC 3.94 3.87 - 5.11 MIL/uL   Hemoglobin 11.6 (L) 12.0 - 15.0 g/dL   HCT 16.1 (L) 09.6 - 04.5 %   MCV 87.8 78.0 - 100.0 fL   MCH 29.4 26.0 - 34.0 pg   MCHC 33.5 30.0 - 36.0 g/dL   RDW 40.9 81.1 - 91.4 %   Platelets 342 150 - 400 K/uL  hCG, quantitative, pregnancy     Status: Abnormal   Collection Time: 11/20/16  6:54 AM  Result Value Ref Range   hCG, Beta Chain, Quant, S 78,295 (H) <5 mIU/mL  Wet prep, genital     Status: Abnormal   Collection Time: 11/20/16  7:05 AM  Result Value Ref Range   Yeast Wet Prep HPF POC NONE SEEN NONE SEEN   Trich, Wet Prep NONE SEEN NONE SEEN   Clue Cells Wet Prep HPF POC NONE SEEN NONE SEEN   WBC, Wet Prep HPF POC MODERATE (A) NONE SEEN   Sperm NONE SEEN       MDM +UPT UA, wet prep,  GC/chlamydia, CBC, ABO/Rh, quant hCG, HIV, and [place] today to rule out ectopic pregnancy O positive Ultrasound shows SIUP with cardiac activity measuring [redacted]w[redacted]d S/w Dr. Henderson Cloud. Informed of patient complaint & ultrasound results. Ok to discharge home  Assessment and Plan  A:  1. Normal IUP (intrauterine pregnancy) on prenatal ultrasound, first trimester   2. Abdominal pain in pregnancy   3. Nausea and vomiting during pregnancy prior to [redacted] weeks gestation    P: Discharge home Rx phenergan Discussed reasons to return to MAU Call OB to schedule prenatal care  GC/CT pending   Judeth Horn 11/20/2016,  6:58 AM

## 2016-11-20 NOTE — Progress Notes (Signed)
Judeth HornErin Lawrence NP notified of pt's admission and status. Will put in orders and come see pt

## 2016-11-20 NOTE — MAU Note (Addendum)
Having abd pain that is cramping like my period cramps. Twisitng, sharp pain in mid abdomen. Denies vag bleeding. Some white vag d/c. Had IUD removed 10/15/16 and started BCPs on 10/17/16. HAd pos upt on Tues 3/6

## 2016-11-22 LAB — GC/CHLAMYDIA PROBE AMP (~~LOC~~) NOT AT ARMC
CHLAMYDIA, DNA PROBE: NEGATIVE
NEISSERIA GONORRHEA: NEGATIVE

## 2016-12-05 ENCOUNTER — Emergency Department: Payer: BLUE CROSS/BLUE SHIELD

## 2016-12-05 ENCOUNTER — Encounter: Payer: Self-pay | Admitting: *Deleted

## 2016-12-05 ENCOUNTER — Emergency Department
Admission: EM | Admit: 2016-12-05 | Discharge: 2016-12-05 | Disposition: A | Discharge status: Home / Self Care | Payer: BLUE CROSS/BLUE SHIELD | Attending: Emergency Medicine | Admitting: Emergency Medicine

## 2016-12-05 DIAGNOSIS — L568 Other specified acute skin changes due to ultraviolet radiation: Secondary | ICD-10-CM | POA: Diagnosis not present

## 2016-12-05 DIAGNOSIS — Z3A08 8 weeks gestation of pregnancy: Secondary | ICD-10-CM | POA: Diagnosis not present

## 2016-12-05 DIAGNOSIS — X32XXXA Exposure to sunlight, initial encounter: Secondary | ICD-10-CM | POA: Diagnosis not present

## 2016-12-05 DIAGNOSIS — O9989 Other specified diseases and conditions complicating pregnancy, childbirth and the puerperium: Secondary | ICD-10-CM | POA: Insufficient documentation

## 2016-12-05 DIAGNOSIS — R200 Anesthesia of skin: Secondary | ICD-10-CM | POA: Insufficient documentation

## 2016-12-05 DIAGNOSIS — R51 Headache: Secondary | ICD-10-CM | POA: Diagnosis not present

## 2016-12-05 DIAGNOSIS — R519 Headache, unspecified: Secondary | ICD-10-CM

## 2016-12-05 LAB — CBC
HCT: 34.1 % — ABNORMAL LOW (ref 35.0–47.0)
Hemoglobin: 11.6 g/dL — ABNORMAL LOW (ref 12.0–16.0)
MCH: 29.4 pg (ref 26.0–34.0)
MCHC: 34 g/dL (ref 32.0–36.0)
MCV: 86.6 fL (ref 80.0–100.0)
PLATELETS: 345 10*3/uL (ref 150–440)
RBC: 3.94 MIL/uL (ref 3.80–5.20)
RDW: 13.5 % (ref 11.5–14.5)
WBC: 7.5 10*3/uL (ref 3.6–11.0)

## 2016-12-05 LAB — SEDIMENTATION RATE: Sed Rate: 8 mm/hr (ref 0–20)

## 2016-12-05 LAB — BASIC METABOLIC PANEL
Anion gap: 4 — ABNORMAL LOW (ref 5–15)
BUN: 16 mg/dL (ref 6–20)
CO2: 25 mmol/L (ref 22–32)
CREATININE: 0.68 mg/dL (ref 0.44–1.00)
Calcium: 8.8 mg/dL — ABNORMAL LOW (ref 8.9–10.3)
Chloride: 106 mmol/L (ref 101–111)
GFR calc Af Amer: >60 mL/min (ref >60)
GLUCOSE: 89 mg/dL (ref 65–99)
POTASSIUM: 3.9 mmol/L (ref 3.5–5.1)
SODIUM: 135 mmol/L (ref 135–145)

## 2016-12-05 MED ORDER — DIPHENHYDRAMINE HCL 25 MG PO CAPS
25.0000 mg | ORAL_CAPSULE | Freq: Once | ORAL | Status: AC
  Administered 2016-12-05: 25 mg via ORAL
  Filled 2016-12-05: qty 1

## 2016-12-05 MED ORDER — OXYCODONE-ACETAMINOPHEN 5-325 MG PO TABS
1.0000 | ORAL_TABLET | Freq: Once | ORAL | Status: AC
  Administered 2016-12-05: 1 via ORAL
  Filled 2016-12-05: qty 1

## 2016-12-05 MED ORDER — ACETAMINOPHEN 325 MG PO TABS
650.0000 mg | ORAL_TABLET | Freq: Once | ORAL | Status: AC
  Administered 2016-12-05: 650 mg via ORAL
  Filled 2016-12-05: qty 2

## 2016-12-05 MED ORDER — PROCHLORPERAZINE MALEATE 10 MG PO TABS: 10.0000 mg | ORAL_TABLET | Freq: Four times a day (QID) | ORAL | Status: DC | PRN

## 2016-12-05 NOTE — ED Triage Notes (Signed)
Pt complains of sensitivity and pain to right side of face, face symmetrical, pt denies any other symptoms

## 2016-12-05 NOTE — ED Provider Notes (Signed)
[facility] Emergency Department Provider Note  ____________________________________________  Time seen: Approximately 6:36 PM  I have reviewed the triage vital signs and the nursing notes.   HISTORY  Chief Complaint Facial Pain    HPI Nicole Evans is a 21 y.o. female approximately [redacted] weeks pregnant with a history of migraines presenting with headache and right facial numbness and burning. The patient reports that she has a moderate frontal headache. At baseline, she does have a history of migraines but generally has posterior headaches without any neurologic abnormalities. This morning, the patient woke up with numbness most prominently below the right orbit, involving the entirety of the cheek, with an associated "burning" sensation and she touched her face. In addition she has right-sided photosensitivity, but no eye pain or changes in vision. No other numbness tingling or weakness difficulty walking, or changes in mental status. For fevers or rash.  FH: Mother with MS   Past Medical History:  Diagnosis Date  . Hx of chlamydia infection 2014  . Migraine     Patient Active Problem List   Diagnosis Date Noted  . Vacuum extractor delivery, delivered 12/10/2015  . Indication for care in labor or delivery 12/09/2015    Past Surgical History:  Procedure Laterality Date  . APPENDECTOMY    . CHOLECYSTECTOMY    . WISDOM TOOTH EXTRACTION      Current Outpatient Rx  . Order #: [id number] Class: Historical Med  . Order #: [id number] Class: Normal    Allergies Penicillins  Family History  Problem Relation Age of Onset  . Hypertension Mother   . Fibromyalgia Mother   . Healthy Father     Social History Social History  Substance Use Topics  . Smoking status: Never Smoker  . Smokeless tobacco: Never Used  . Alcohol use No    Review of Systems Constitutional: No fever/chills.No lightheadedness or syncope. Eyes: No visual changes. No blurred or  double vision. Positive photosensitivity on the right. ENT: No sore throat. No congestion or rhinorrhea. Cardiovascular: Denies chest pain. Denies palpitations. Respiratory: Denies shortness of breath.  No cough. Gastrointestinal: No abdominal pain.  No nausea, no vomiting.  No diarrhea.  No constipation. Genitourinary: Negative for dysuria. Musculoskeletal: Negative for back pain. Skin: Negative for rash. Neurological: Positive for frontal headache. Positive numbness and burning sensation on the right face. No focal weakness. No difficult walking. No changes in mental status. No difficulty with speech.   10-point ROS otherwise negative.  ____________________________________________   PHYSICAL EXAM:  VITAL SIGNS: ED Triage Vitals [12/05/16 1741]  Enc Vitals Group     BP (!) 132/55     Pulse Rate 92     Resp 20     Temp 98.4 F (36.9 C)     Temp Source Oral     SpO2 100 %     Weight 123 lb (55.8 kg)     Height 5\' 5"  (1.651 m)     Head Circumference      Peak Flow      Pain Score      Pain Loc      Pain Edu?      Excl. in GC?     Constitutional: Alert and oriented. Well appearing and in no acute distress. Answers questions appropriately. Eyes: Conjunctivae are normal.  EOMI. PERRLA. No significant photosensitivity on my examination. No scleral icterus. Head: Atraumatic. Nose: No congestion/rhinnorhea. Mouth/Throat: Mucous membranes are moist.  Neck: No stridor.  Supple.  No JVD. No meningismus. Cardiovascular: Normal  rate, regular rhythm. No murmurs, rubs or gallops.  Respiratory: Normal respiratory effort.  No accessory muscle use or retractions. Lungs CTAB.  No wheezes, rales or ronchi. Gastrointestinal: Soft, nontender and nondistended.  No guarding or rebound.  No peritoneal signs. Musculoskeletal: No LE edema. No ttp in the calves or palpable cords.  Negative Homan's sign. Neurologic: Alert and oriented 3. Speech is clear. Face and smile symmetric. Tongue is  midline. EOMI and PERRLA. No horizontal or vertical nystagmus. Normal cheek puff. No pronator drift. 5 out of 5 grip, biceps, triceps, hip flexors, plantar flexion and dorsiflexion. Normal sensation to light touch in the bilateral upper and lower extremities; decreased sensation to light touch in the right face, more prominently on the cheek but also involving the forehead.. Normal heel-to-shin. Normal gait without ataxia. Skin:  Skin is warm, dry and intact. No rash noted. Psychiatric: Mood and affect are normal. Speech and behavior are normal.  Normal judgement.  ____________________________________________   LABS (all labs ordered are listed, but only abnormal results are displayed)  Labs Reviewed  CBC - Abnormal; Notable for the following:       Result Value   Hemoglobin 11.6 (*)    HCT 34.1 (*)    All other components within normal limits  BASIC METABOLIC PANEL - Abnormal; Notable for the following:    Calcium 8.8 (*)    Anion gap 4 (*)    All other components within normal limits  SEDIMENTATION RATE   ____________________________________________  EKG  ED ECG REPORT I, Rockne MenghiniNorman, Anne-Caroline, the attending physician, personally viewed and interpreted this ECG.   Date: 12/05/2016  EKG Time: 1905  Rate: 93  Rhythm: normal sinus rhythm  Axis: normal  Intervals:none  ST&T Change: No STEMI  ____________________________________________  RADIOLOGY  Mr Maxine GlennMra Head Wo Contrast  Result Date: 12/05/2016 CLINICAL DATA:  21 year old pregnant female with right side facial pain and right eye photophobia for 1 day. EXAM: MRA HEAD WITHOUT CONTRAST MRV HEAD WITHOUT CONTRAST TECHNIQUE: Angiographic images of the intracranial vascular structures were obtained using both MRA and MRV technique without intravenous contrast. COMPARISON:  None. FINDINGS: MRA FINDINGS No intracranial mass effect or ventriculomegaly is evident. Cerebral volume appears to be normal. No orbital soft tissue asymmetry is  evident. I suspect the bilateral paranasal sinuses and mastoids are clear. Antegrade flow in the posterior circulation with codominant distal vertebral arteries. No distal vertebral stenosis. Normal PICA origins, the left PICA is diminutive. Normal vertebrobasilar junction. Normal AICA origins, the left is dominant. No basilar stenosis. SCA and PCA origins are normal. Both posterior communicating arteries are present. Normal bilateral PCA branches. Antegrade flow in both ICA siphons. Normal distal cervical ICAs. No siphon stenosis. Normal ophthalmic and posterior communicating artery origins. Normal carotid termini, MCA and ACA origins. The anterior communicating artery is diminutive or absent. Visible ACA branches are within normal limits. Visible bilateral MCA branches are within normal limits. MRV FINDINGS Normal flow signal in the superior sagittal sinus, the torcula, the straight sinus, the vein of Galen, the internal cerebral veins, the basal veins of Rosenthal, the transverse sinus is (the right is mildly dominant), both sigmoid sinuses, and both IJ bulbs. Preserved flow in the bilateral veins of Labbe and Trolard veins. The superior ophthalmic veins appear symmetric and within normal limits. IMPRESSION: Normal intracranial MRA and MRV. Electronically Signed   By: Odessa FlemingH  Hall M.D.   On: 12/05/2016 20:06   Mr Mrv Head Wo Cm  Result Date: 12/05/2016 CLINICAL DATA:  21 year old  pregnant female with right side facial pain and right eye photophobia for 1 day. EXAM: MRA HEAD WITHOUT CONTRAST MRV HEAD WITHOUT CONTRAST TECHNIQUE: Angiographic images of the intracranial vascular structures were obtained using both MRA and MRV technique without intravenous contrast. COMPARISON:  None. FINDINGS: MRA FINDINGS No intracranial mass effect or ventriculomegaly is evident. Cerebral volume appears to be normal. No orbital soft tissue asymmetry is evident. I suspect the bilateral paranasal sinuses and mastoids are clear.  Antegrade flow in the posterior circulation with codominant distal vertebral arteries. No distal vertebral stenosis. Normal PICA origins, the left PICA is diminutive. Normal vertebrobasilar junction. Normal AICA origins, the left is dominant. No basilar stenosis. SCA and PCA origins are normal. Both posterior communicating arteries are present. Normal bilateral PCA branches. Antegrade flow in both ICA siphons. Normal distal cervical ICAs. No siphon stenosis. Normal ophthalmic and posterior communicating artery origins. Normal carotid termini, MCA and ACA origins. The anterior communicating artery is diminutive or absent. Visible ACA branches are within normal limits. Visible bilateral MCA branches are within normal limits. MRV FINDINGS Normal flow signal in the superior sagittal sinus, the torcula, the straight sinus, the vein of Galen, the internal cerebral veins, the basal veins of Rosenthal, the transverse sinus is (the right is mildly dominant), both sigmoid sinuses, and both IJ bulbs. Preserved flow in the bilateral veins of Labbe and Trolard veins. The superior ophthalmic veins appear symmetric and within normal limits. IMPRESSION: Normal intracranial MRA and MRV. Electronically Signed   By: Odessa Fleming M.D.   On: 12/05/2016 20:06    ____________________________________________   PROCEDURES  Procedure(s) performed: None  Procedures  Critical Care performed: No ____________________________________________   INITIAL IMPRESSION / ASSESSMENT AND PLAN / ED COURSE  Pertinent labs & imaging results that were available during my care of the patient were reviewed by me and considered in my medical decision making (see chart for details).  21 y.o. female presenting with headache, right facial numbness, and right eye photosensitivity. Overall, the patient has reassuring vital signs and is afebrile. His possible that this is an atypical migraine, and I'll treat her with Tylenol to start for her pain.  However, given her pregnant state, she is a increased risk for clot and I will get an MRV to evaluate for venous sinus thrombosis. I'm also concerned about multiple sclerosis, although the symptoms would be mild and she has no history of this. We will obtain an MRI of the brain for further evaluation of this. Basic labs will be obtained to rule out electrolyte causes of numbness. The patient will be reevaluated for final disposition.  ----------------------------------------- 6:40 PM on 12/05/2016 -----------------------------------------  I have spoken with Dr. Thad Ranger, the neurologist on-call, who agrees with the above plan. Her recommendation would be to add a sedimentation rate for outpatient follow-up if the patient's workup here is reassuring.  ----------------------------------------- 8:22 PM on 12/05/2016 -----------------------------------------  The patient's MR brain and MRV are negative for any acute process, there is no evidence of venous sinus thrombosis. The patient's laboratory studies are also reassuring, including a sedimentation rate of 8. However, she continues to have headache and states that her symptoms have not improved with 650 mg of Tylenol. I will try additional medications to treat her pain, and hopes that this will also resolve her right facial sensory abnormalities. Plan discharge with close PMD follow-up.  Return precautions were discussed.  ____________________________________________  FINAL CLINICAL IMPRESSION(S) / ED DIAGNOSES  Final diagnoses:  Headache  Right facial numbness  Photosensitivity         NEW MEDICATIONS STARTED DURING THIS VISIT:  New Prescriptions   No medications on file      Rockne Menghini, MD 12/05/16 2024

## 2016-12-05 NOTE — ED Triage Notes (Signed)
Pt reports light sensitivity to right eye

## 2016-12-05 NOTE — Discharge Instructions (Signed)
Please drink plenty of fluids stay well-hydrated, eat small regular meals throughout the day, and get plenty of rest. Please make an appointment with a primary care physician for reevaluation.  Return to the emergency department if you develop severe pain, numbness tingling or weakness, vomiting, changes in mental status, changes in vision, or any other symptoms concerning to you.

## 2016-12-05 NOTE — ED Notes (Addendum)
Patient transported to MRI 

## 2016-12-05 NOTE — ED Notes (Signed)
Pt. Going home with boyfriend. 

## 2016-12-17 LAB — OB RESULTS CONSOLE ABO/RH: RH Type: POSITIVE

## 2016-12-17 LAB — OB RESULTS CONSOLE GC/CHLAMYDIA
CHLAMYDIA, DNA PROBE: NEGATIVE
Gonorrhea: NEGATIVE

## 2016-12-17 LAB — OB RESULTS CONSOLE ANTIBODY SCREEN: ANTIBODY SCREEN: NEGATIVE

## 2016-12-17 LAB — OB RESULTS CONSOLE RUBELLA ANTIBODY, IGM: Rubella: IMMUNE

## 2016-12-17 LAB — OB RESULTS CONSOLE HEPATITIS B SURFACE ANTIGEN: Hepatitis B Surface Ag: NEGATIVE

## 2016-12-17 LAB — OB RESULTS CONSOLE RPR: RPR: NONREACTIVE

## 2016-12-17 LAB — OB RESULTS CONSOLE HIV ANTIBODY (ROUTINE TESTING): HIV: NONREACTIVE

## 2016-12-29 ENCOUNTER — Other Ambulatory Visit (HOSPITAL_COMMUNITY): Payer: Self-pay | Admitting: Obstetrics & Gynecology

## 2016-12-29 DIAGNOSIS — E010 Iodine-deficiency related diffuse (endemic) goiter: Secondary | ICD-10-CM

## 2016-12-31 ENCOUNTER — Ambulatory Visit (HOSPITAL_COMMUNITY): Payer: BLUE CROSS/BLUE SHIELD

## 2017-01-04 ENCOUNTER — Encounter: Payer: Self-pay | Admitting: Medical Oncology

## 2017-01-04 ENCOUNTER — Emergency Department
Admission: EM | Admit: 2017-01-04 | Discharge: 2017-01-04 | Disposition: A | Discharge status: Home / Self Care | Payer: BLUE CROSS/BLUE SHIELD | Attending: Emergency Medicine | Admitting: Emergency Medicine

## 2017-01-04 DIAGNOSIS — O26891 Other specified pregnancy related conditions, first trimester: Secondary | ICD-10-CM | POA: Diagnosis present

## 2017-01-04 DIAGNOSIS — O1211 Gestational proteinuria, first trimester: Secondary | ICD-10-CM | POA: Diagnosis not present

## 2017-01-04 DIAGNOSIS — O210 Mild hyperemesis gravidarum: Secondary | ICD-10-CM | POA: Diagnosis not present

## 2017-01-04 DIAGNOSIS — R945 Abnormal results of liver function studies: Secondary | ICD-10-CM | POA: Diagnosis not present

## 2017-01-04 DIAGNOSIS — Z3A12 12 weeks gestation of pregnancy: Secondary | ICD-10-CM | POA: Insufficient documentation

## 2017-01-04 DIAGNOSIS — R7989 Other specified abnormal findings of blood chemistry: Secondary | ICD-10-CM

## 2017-01-04 DIAGNOSIS — R809 Proteinuria, unspecified: Secondary | ICD-10-CM

## 2017-01-04 LAB — URINALYSIS, COMPLETE (UACMP) WITH MICROSCOPIC
Bacteria, UA: NONE SEEN
Bilirubin Urine: NEGATIVE
Glucose, UA: NEGATIVE mg/dL
Hgb urine dipstick: NEGATIVE
KETONES UR: NEGATIVE mg/dL
Leukocytes, UA: NEGATIVE
NITRITE: NEGATIVE
PROTEIN: 30 mg/dL — AB
Specific Gravity, Urine: 1.03 (ref 1.005–1.030)
pH: 6 (ref 5.0–8.0)

## 2017-01-04 LAB — CBC
HCT: 35.9 % (ref 35.0–47.0)
HEMOGLOBIN: 12.4 g/dL (ref 12.0–16.0)
MCH: 30.1 pg (ref 26.0–34.0)
MCHC: 34.6 g/dL (ref 32.0–36.0)
MCV: 87 fL (ref 80.0–100.0)
Platelets: 344 10*3/uL (ref 150–440)
RBC: 4.12 MIL/uL (ref 3.80–5.20)
RDW: 13.1 % (ref 11.5–14.5)
WBC: 6.2 10*3/uL (ref 3.6–11.0)

## 2017-01-04 LAB — COMPREHENSIVE METABOLIC PANEL
ALK PHOS: 84 U/L (ref 38–126)
ALT: 121 U/L — ABNORMAL HIGH (ref 14–54)
ANION GAP: 6 (ref 5–15)
AST: 83 U/L — ABNORMAL HIGH (ref 15–41)
Albumin: 3.9 g/dL (ref 3.5–5.0)
BILIRUBIN TOTAL: 0.8 mg/dL (ref 0.3–1.2)
BUN: 14 mg/dL (ref 6–20)
CALCIUM: 9.1 mg/dL (ref 8.9–10.3)
CO2: 24 mmol/L (ref 22–32)
Chloride: 104 mmol/L (ref 101–111)
Creatinine, Ser: 0.4 mg/dL — ABNORMAL LOW (ref 0.44–1.00)
GFR calc non Af Amer: >60 mL/min (ref >60)
Glucose, Bld: 91 mg/dL (ref 65–99)
Potassium: 4.1 mmol/L (ref 3.5–5.1)
SODIUM: 134 mmol/L — AB (ref 135–145)
Total Protein: 7.7 g/dL (ref 6.5–8.1)

## 2017-01-04 LAB — LIPASE, BLOOD: Lipase: 24 U/L (ref 11–51)

## 2017-01-04 MED ORDER — ONDANSETRON 4 MG PO TBDP: 4.0000 mg | ORAL_TABLET | Freq: Three times a day (TID) | ORAL | 0 refills | Status: DC | PRN

## 2017-01-04 MED ORDER — SODIUM CHLORIDE 0.9 % IV BOLUS (SEPSIS)
1000.0000 mL | Freq: Once | INTRAVENOUS | Status: AC
  Administered 2017-01-04: 1000 mL via INTRAVENOUS

## 2017-01-04 MED ORDER — ONDANSETRON HCL 4 MG/2ML IJ SOLN
4.0000 mg | Freq: Once | INTRAMUSCULAR | Status: AC
  Administered 2017-01-04: 4 mg via INTRAVENOUS
  Filled 2017-01-04: qty 2

## 2017-01-04 NOTE — ED Triage Notes (Signed)
Pt reports that she is approx [redacted] weeks pregnant and this am began having vomiting. Pt reports generalized abd pain. Denies vaginal bleeding.

## 2017-01-04 NOTE — ED Notes (Signed)
Pt states she is approx [redacted] weeks pregnant and has n/v today.  No urinary sx  No vag bleeding.  Intermittent abd pain.  g2p1a0.  Pt alert   Family with pt.

## 2017-01-04 NOTE — ED Notes (Signed)
Pt PO challenged with Sprite

## 2017-01-04 NOTE — ED Provider Notes (Signed)
[facility] Emergency Department Provider Note  ____________________________________________  Time seen: Approximately 3:52 PM  I have reviewed the triage vital signs and the nursing notes.   HISTORY  Chief Complaint Emesis    HPI Nicole Evans is a 21 y.o. female G2 P1 approximately [redacted] weeks pregnantwith hyperemesis gravidarum, s/p cholecystectomy remotely, presenting with vomiting. The patient reports that she has been under a clean just, which has improved her symptoms over the past week until today. She has been unable to tolerate anything by mouth. She developed some epigastric discomfort after vomiting, but otherwise does not have any abdominal pain, change in vaginal discharge, or vaginal bleeding. She is not had any fever, diarrhea or constipation.  She has established OB care with Dr. Langston Masker.   Past Medical History:  Diagnosis Date  . Hx of chlamydia infection 2014  . Migraine     Patient Active Problem List   Diagnosis Date Noted  . Vacuum extractor delivery, delivered 12/10/2015  . Indication for care in labor or delivery 12/09/2015    Past Surgical History:  Procedure Laterality Date  . APPENDECTOMY    . CHOLECYSTECTOMY    . WISDOM TOOTH EXTRACTION      Current Outpatient Rx  . Order #: [id number] Class: Historical Med  . Order #: [id number] Class: Normal    Allergies Penicillins  Family History  Problem Relation Age of Onset  . Hypertension Mother   . Fibromyalgia Mother   . Healthy Father     Social History Social History  Substance Use Topics  . Smoking status: Never Smoker  . Smokeless tobacco: Never Used  . Alcohol use No    Review of Systems Constitutional: No fever/chills. Eyes: No visual changes. ENT: No sore throat. No congestion or rhinorrhea. Cardiovascular: Denies chest pain. Denies palpitations. Respiratory: Denies shortness of breath.  No cough. Gastrointestinal: Positive mild epigastric abdominal pain.   Positive nausea, positive vomiting.  No diarrhea.  No constipation. Genitourinary: Negative for dysuria.No hematuria. No vaginal bleeding. No gush of fluid. No change in vaginal discharge. Musculoskeletal: Negative for back pain. Skin: Negative for rash. Neurological: Negative for headaches. No focal numbness, tingling or weakness.   10-point ROS otherwise negative.  ____________________________________________   PHYSICAL EXAM:  VITAL SIGNS: ED Triage Vitals  Enc Vitals Group     BP 01/04/17 1336 126/65     Pulse Rate 01/04/17 1336 (!) 109     Resp 01/04/17 1336 18     Temp 01/04/17 1336 98.4 F (36.9 C)     Temp Source 01/04/17 1336 Oral     SpO2 01/04/17 1336 100 %     Weight 01/04/17 1334 123 lb (55.8 kg)     Height 01/04/17 1334  (1.651 m)     Head Circumference --      Peak Flow --      Pain Score 01/04/17 1334 7     Pain Loc --      Pain Edu? --      Excl. in GC? --     Constitutional: Alert and oriented. Well appearing and in no acute distress. Answers questions appropriately. Eyes: Conjunctivae are normal.  EOMI. No scleral icterus. Head: Atraumatic. Nose: No congestion/rhinnorhea. Mouth/Throat: Mucous membranes are moist.  Neck: No stridor.  Supple.   Cardiovascular: Normal rate, regular rhythm. No murmurs, rubs or gallops.  Respiratory: Normal respiratory effort.  No accessory muscle use or retractions. Lungs CTAB.  No wheezes, rales or ronchi. Gastrointestinal: Soft, and nondistended.  Minimal  tenderness to palpation in the epigastrium. No Murphy sign. No guarding or rebound.  No peritoneal signs. Genitourinary: Not indicated. Musculoskeletal: No LE edema. No ttp in the calves or palpable cords.  Negative Homan's sign. Neurologic:  A&Ox3.  Speech is clear.  Face and smile are symmetric.  EOMI.  Moves all extremities well. Skin:  Skin is warm, dry and intact. No rash noted. Psychiatric: Mood and affect are normal. Speech and behavior are normal.  Normal  judgement.  ____________________________________________   LABS (all labs ordered are listed, but only abnormal results are displayed)  Labs Reviewed  COMPREHENSIVE METABOLIC PANEL - Abnormal; Notable for the following:       Result Value   Sodium 134 (*)    Creatinine, Ser 0.40 (*)    AST 83 (*)    ALT 121 (*)    All other components within normal limits  URINALYSIS, COMPLETE (UACMP) WITH MICROSCOPIC - Abnormal; Notable for the following:    Color, Urine YELLOW (*)    APPearance HAZY (*)    Protein, ur 30 (*)    Squamous Epithelial / LPF 0-5 (*)    All other components within normal limits  LIPASE, BLOOD  CBC   ____________________________________________  EKG  Not indicated ____________________________________________  RADIOLOGY  No results found.  ____________________________________________   PROCEDURES  Procedure(s) performed: None  Procedures  Critical Care performed: No ____________________________________________   INITIAL IMPRESSION / ASSESSMENT AND PLAN / ED COURSE  Pertinent labs & imaging results that were available during my care of the patient were reviewed by me and considered in my medical decision making (see chart for details).  21 y.o. G2 P1 approximately [redacted] weeks pregnant presenting with nausea and vomiting, epigastric pain on examination. Overall, the patient's triage shows tachycardia at 109 but on my examination her heart rate has normalized. She does not appear significantly hypovolemic, both place an IV and give the patient a liter of intravenous fluid and an antiemetic. The patient has elevation in her AST and ALT, but is s/p chole and this is likely a response to her vomiting today.  This will need to be rechecked by her OB-Gyn or PCP.  Plan reevaluation for final disposition.  ----------------------------------------- 5:10 PM on 01/04/2017 -----------------------------------------  Fetal heart rate is 150.  The patient reports her  nausea has completely resolved and she is tolerating liquid.  I will call her OB-Gyn office to let them know she was here, and that her AST/ALT were abnormal with some proteinuria, so they can re-evaluate her shortly for outpatient f/u.  Plan discharge. ____________________________________________  FINAL CLINICAL IMPRESSION(S) / ED DIAGNOSES  Final diagnoses:  Vomiting  Abnormal LFTs  Proteinuria, unspecified type         NEW MEDICATIONS STARTED DURING THIS VISIT:  New Prescriptions   No medications on file      Rockne Menghini, MD 01/04/17 1713

## 2017-01-04 NOTE — Discharge Instructions (Signed)
Please continue to take the Diclegis, as this helps with your nausea. In addition, you may take Zofran as needed.  Drink plenty of fluid to stay well hydrated. Take a full liquid diet for the next 24 hours, then advance to a bland diet as tolerated.  Return to the emergency department if you develop severe pain, vaginal bleeding, lightheadedness or fainting, fever, inability to keep down fluids, or any other symptoms concerning to you.

## 2017-01-07 ENCOUNTER — Ambulatory Visit (HOSPITAL_COMMUNITY)
Admission: RE | Admit: 2017-01-07 | Discharge: 2017-01-07 | Disposition: A | Discharge status: Home / Self Care | Payer: BLUE CROSS/BLUE SHIELD | Source: Ambulatory Visit | Attending: Obstetrics & Gynecology | Admitting: Obstetrics & Gynecology

## 2017-01-07 DIAGNOSIS — E010 Iodine-deficiency related diffuse (endemic) goiter: Secondary | ICD-10-CM

## 2017-03-13 DIAGNOSIS — R946 Abnormal results of thyroid function studies: Secondary | ICD-10-CM | POA: Insufficient documentation

## 2017-04-07 DIAGNOSIS — E010 Iodine-deficiency related diffuse (endemic) goiter: Secondary | ICD-10-CM | POA: Insufficient documentation

## 2017-04-21 ENCOUNTER — Inpatient Hospital Stay (HOSPITAL_COMMUNITY)
Admission: AD | Admit: 2017-04-21 | Discharge: 2017-04-21 | Disposition: A | Discharge status: Home / Self Care | Payer: BLUE CROSS/BLUE SHIELD | Source: Ambulatory Visit | Attending: Obstetrics and Gynecology | Admitting: Obstetrics and Gynecology

## 2017-04-21 ENCOUNTER — Encounter (HOSPITAL_COMMUNITY): Payer: Self-pay | Admitting: *Deleted

## 2017-04-21 DIAGNOSIS — O9989 Other specified diseases and conditions complicating pregnancy, childbirth and the puerperium: Secondary | ICD-10-CM

## 2017-04-21 DIAGNOSIS — R202 Paresthesia of skin: Secondary | ICD-10-CM

## 2017-04-21 DIAGNOSIS — O26892 Other specified pregnancy related conditions, second trimester: Secondary | ICD-10-CM | POA: Insufficient documentation

## 2017-04-21 DIAGNOSIS — Z3689 Encounter for other specified antenatal screening: Secondary | ICD-10-CM

## 2017-04-21 DIAGNOSIS — Z8249 Family history of ischemic heart disease and other diseases of the circulatory system: Secondary | ICD-10-CM | POA: Diagnosis not present

## 2017-04-21 DIAGNOSIS — R200 Anesthesia of skin: Secondary | ICD-10-CM | POA: Insufficient documentation

## 2017-04-21 DIAGNOSIS — Z880 Allergy status to penicillin: Secondary | ICD-10-CM | POA: Diagnosis not present

## 2017-04-21 DIAGNOSIS — Z3A27 27 weeks gestation of pregnancy: Secondary | ICD-10-CM | POA: Insufficient documentation

## 2017-04-21 MED ORDER — MAGNESIUM 200 MG PO TABS: 1.0000 | ORAL_TABLET | Freq: Every day | ORAL | 0 refills | Status: DC

## 2017-04-21 NOTE — MAU Note (Signed)
Pt reports she has had increased numbness and tingling in both her hands legs and feet bilaterally. Started a  Little last week abut has increased over the past few days. Had mentioned it to OB last Friday and was told to increase calcium,. Pt stated it has not helped. Grips and pedal pushes strong and equal

## 2017-04-21 NOTE — Discharge Instructions (Signed)
Wear wrist splints at night Wear knee high compression socks during the day Increase water intake (80 oz per day) Take magnesium (200 mg per day)      Carpal Tunnel Syndrome Carpal tunnel syndrome is a condition that causes pain in your hand and arm. The carpal tunnel is a narrow area located on the palm side of your wrist. Repeated wrist motion or certain diseases may cause swelling within the tunnel. This swelling pinches the main nerve in the wrist (median nerve). What are the causes? This condition may be caused by:  Repeated wrist motions.  Wrist injuries.  Arthritis.  A cyst or tumor in the carpal tunnel.  Fluid buildup during pregnancy.  Sometimes the cause of this condition is not known. What increases the risk? This condition is more likely to develop in:  People who have jobs that cause them to repeatedly move their wrists in the same motion, such as Health visitor.  Women.  People with certain conditions, such as: ? Diabetes. ? Obesity. ? An underactive thyroid (hypothyroidism). ? Kidney failure.  What are the signs or symptoms? Symptoms of this condition include:  A tingling feeling in your fingers, especially in your thumb, index, and middle fingers.  Tingling or numbness in your hand.  An aching feeling in your entire arm, especially when your wrist and elbow are bent for long periods of time.  Wrist pain that goes up your arm to your shoulder.  Pain that goes down into your palm or fingers.  A weak feeling in your hands. You may have trouble grabbing and holding items.  Your symptoms may feel worse during the night. How is this diagnosed? This condition is diagnosed with a medical history and physical exam. You may also have tests, including:  An electromyogram (EMG). This test measures electrical signals sent by your nerves into the muscles.  X-rays.  How is this treated? Treatment for this condition includes:  Lifestyle changes.  It is important to stop doing or modify the activity that caused your condition.  Physical or occupational therapy.  Medicines for pain and inflammation. This may include medicine that is injected into your wrist.  A wrist splint.  Surgery.  Follow these instructions at home: If you have a splint:  Wear it as told by your health care provider. Remove it only as told by your health care provider.  Loosen the splint if your fingers become numb and tingle, or if they turn cold and blue.  Keep the splint clean and dry. General instructions  Take over-the-counter and prescription medicines only as told by your health care provider.  Rest your wrist from any activity that may be causing your pain. If your condition is work related, talk to your employer about changes that can be made, such as getting a wrist pad to use while typing.  If directed, apply ice to the painful area: ? Put ice in a plastic bag. ? Place a towel between your skin and the bag. ? Leave the ice on for 20 minutes, 2-3 times per day.  Keep all follow-up visits as told by your health care provider. This is important.  Do any exercises as told by your health care provider, physical therapist, or occupational therapist. Contact a health care provider if:  You have new symptoms.  Your pain is not controlled with medicines.  Your symptoms get worse. This information is not intended to replace advice given to you by your health care provider. Make sure  you discuss any questions you have with your health care provider. Document Released: 08/27/2000 Document Revised: 01/08/2016 Document Reviewed: 01/15/2015 [employer] Interactive Patient Education  2017 [employer]

## 2017-04-21 NOTE — MAU Provider Note (Signed)
History     CSN: [id number]  Arrival date and time: 04/21/17 [id number]  First Provider Initiated Contact with Patient 04/21/17 0940      Chief Complaint  Patient presents with  . Numbness  . Tingling   HPI Nicole Evans is a 21 y.o. G2P1001 at 355w6d who presents with numbness & tingling in her extremities. Symptoms began over a week ago & have gradually worsened. Discussed with her OB last week & was told to increase calcium; states has not improved. Reports numbness & tingling in bilateral hands. Has also noted same symptoms in feet that cause pain with walking, as well as edema in BLE. Denies calf pain, SOB, or chest pain. Denies abdominal pain, LOF, vaginal bleeding. Positive fetal movement. [facility] as she thought she was moving to the area, no longer moving & has next appointment at [facility] scheduled in 2 weeks.   OB History    Gravida Para Term Preterm AB Living   2 1 1     1    SAB TAB Ectopic Multiple Live Births         0 1      Past Medical History:  Diagnosis Date  . Hx of chlamydia infection 2014  . Migraine     Past Surgical History:  Procedure Laterality Date  . APPENDECTOMY    . CHOLECYSTECTOMY    . WISDOM TOOTH EXTRACTION      Family History  Problem Relation Age of Onset  . Hypertension Mother   . Fibromyalgia Mother   . Healthy Father     Social History  Substance Use Topics  . Smoking status: Never Smoker  . Smokeless tobacco: Never Used  . Alcohol use No    Allergies:  Allergies  Allergen Reactions  . Penicillins Anaphylaxis, Rash and Other (See Comments)    Has patient had a PCN reaction causing immediate rash, facial/tongue/throat swelling, SOB or lightheadedness with hypotension: Yes Has patient had a PCN reaction causing severe rash involving mucus membranes or skin necrosis: No Has patient had a PCN reaction that required hospitalization No Has patient had a PCN reaction occurring within the  last 10 years: No If all of the above answers are "NO", then may proceed with Cephalosporin use.    Prescriptions Prior to Admission  Medication Sig Dispense Refill Last Dose  . ondansetron (ZOFRAN ODT) 4 MG disintegrating tablet Take 1 tablet (4 mg total) by mouth every 8 (eight) hours as needed for nausea or vomiting. 20 tablet 0   . Prenatal Vit-Fe Fumarate-FA (PRENATAL MULTIVITAMIN) TABS tablet Take 1 tablet by mouth at bedtime.   12/08/2015 at Unknown time  . promethazine (PHENERGAN) 25 MG tablet Take 1 tablet (25 mg total) by mouth every 6 (six) hours as needed for nausea or vomiting. 30 tablet 0     Review of Systems  Constitutional: Negative.   Cardiovascular: Positive for leg swelling.  Gastrointestinal: Negative.  Negative for abdominal pain.  Genitourinary: Negative.   Musculoskeletal: Negative for back pain and joint swelling.  Neurological: Positive for numbness.       + paresthesia   Physical Exam   Blood pressure (!) 107/57, pulse 92, temperature 98.6 F (37 C), temperature source Oral, resp. rate 17, height 5\' 5"  (1.651 m), weight 146 lb (66.2 kg), last menstrual period 09/14/2016, unknown if currently breastfeeding.  Physical Exam  Nursing note and vitals reviewed. Constitutional: She is oriented to person, place, and time. She  appears well-developed and well-nourished. No distress.  HENT:  Head: Normocephalic and atraumatic.  Eyes: Conjunctivae are normal. Right eye exhibits no discharge. Left eye exhibits no discharge. No scleral icterus.  Neck: Normal range of motion.  Cardiovascular: Normal rate, regular rhythm, normal heart sounds and intact distal pulses.   No murmur heard. Respiratory: Effort normal and breath sounds normal. No respiratory distress. She has no wheezes.  GI: Soft. Bowel sounds are normal. There is no tenderness.  Abdomen soft  Musculoskeletal: She exhibits edema (mild nonpitting edema in bilateral ankles & feet).  Neurological: She is alert  and oriented to person, place, and time. She has normal strength.  Reflex Scores:      Bicep reflexes are 2+ on the right side and 2+ on the left side.      Patellar reflexes are 2+ on the right side and 2+ on the left side. Skin: Skin is warm and dry. She is not diaphoretic.  Psychiatric: She has a normal mood and affect. Her behavior is normal. Judgment and thought content normal.   Fetal Tracing:  Baseline: 155 Variability: moderate Accelerations: 15x15 Decelerations: none  Toco: x3, 40 sec MAU Course  Procedures No results found for this or any previous visit (from the past 24 hour(s)).  MDM Reactive fetal tracing VSS, NAD Benign exam Discussed with Dr. Renaldo Fiddler. Ok to discharge home.  Assessment and Plan  A: 1. Paresthesia of both feet   2. Paresthesia of both hands   3. NST (non-stress test) reactive    P; Discharge home Rx magnesium 200 mg daily Knee high compression socks during the day Increase water intake Wrist splints at night Discussed reasons to return to MAU Keep f/u with OB  Judeth Horn 04/21/2017, 9:40 AM

## 2017-04-29 DIAGNOSIS — O99013 Anemia complicating pregnancy, third trimester: Secondary | ICD-10-CM | POA: Insufficient documentation

## 2017-06-09 ENCOUNTER — Encounter: Payer: Self-pay | Admitting: *Deleted

## 2017-06-09 ENCOUNTER — Observation Stay
Admission: EM | Admit: 2017-06-09 | Discharge: 2017-06-09 | Disposition: A | Discharge status: Home / Self Care | Payer: BLUE CROSS/BLUE SHIELD | Attending: Obstetrics and Gynecology | Admitting: Obstetrics and Gynecology

## 2017-06-09 DIAGNOSIS — Z3A35 35 weeks gestation of pregnancy: Secondary | ICD-10-CM | POA: Insufficient documentation

## 2017-06-09 DIAGNOSIS — R51 Headache: Secondary | ICD-10-CM | POA: Diagnosis not present

## 2017-06-09 DIAGNOSIS — O9989 Other specified diseases and conditions complicating pregnancy, childbirth and the puerperium: Secondary | ICD-10-CM | POA: Diagnosis present

## 2017-06-09 DIAGNOSIS — Z3490 Encounter for supervision of normal pregnancy, unspecified, unspecified trimester: Secondary | ICD-10-CM

## 2017-06-09 DIAGNOSIS — O26893 Other specified pregnancy related conditions, third trimester: Secondary | ICD-10-CM | POA: Diagnosis not present

## 2017-06-09 MED ORDER — ACETAMINOPHEN-CODEINE #3 300-30 MG PO TABS
ORAL_TABLET | ORAL | Status: AC
  Administered 2017-06-09: 2 via ORAL
  Filled 2017-06-09: qty 2

## 2017-06-09 MED ORDER — ACETAMINOPHEN-CODEINE #3 300-30 MG PO TABS
2.0000 | ORAL_TABLET | Freq: Once | ORAL | Status: AC
  Administered 2017-06-09: 2 via ORAL

## 2017-06-09 NOTE — Discharge Instructions (Signed)
Come back if:  Big gush of fluids Decreased fetal movement Heavy vaginal bleeding Temp over 100.4 Contractions every 3-5 min lasting at least one hour  Take tylenol as needed.  Get plenty of rest and stay well hydrated!

## 2017-06-09 NOTE — OB Triage Note (Signed)
Recvd pt from ED. States she has been having braxton hicks contractions for a couple days. States she has a headache and blurred vision that started yesterday around 1700. Says shes been taking tylenol and that it has not helped. Feeling baby move ok. No vaginal bleeding or LOF. Pt has had no complications with this pregnancy. No high blood pressure or gest. diab.

## 2017-06-10 NOTE — Discharge Summary (Signed)
    L&D OB Triage Note  SUBJECTIVE Nicole Evans is a 21 y.o. G2P1001 female at [redacted]w[redacted]d, EDD Estimated Date of Delivery: 07/15/17 who presented to triage with complaints of persistent headache.   Pt has a h/o migraines.  Obstetric History   G2   P1   T1   P0   A0   L1    SAB0   TAB0   Ectopic0   Multiple0   Live Births1     # Outcome Date GA Lbr Len/2nd Weight Sex Delivery Anes PTL Lv  2 Current           1 Term 12/10/15 [redacted]w[redacted]d 27:32 / 00:37 6 lb 11.4 oz (3.045 kg) F Vag-Vacuum EPI  LIV     Name: Owusu,GIRL Ysabelle     Apgar1:  8                Apgar5: 9      No prescriptions prior to admission.     OBJECTIVE  Nursing Evaluation:   BP 116/66   Pulse 77   LMP 09/14/2016 (LMP Unknown)    Findings:  Normal reflexes.  NST was performed and has been reviewed by me.  NST INTERPRETATION: Category I  Mode: External Baseline Rate (A): 135 bpm Variability: Moderate Accelerations: 15 x 15 Decelerations: None     Contraction Frequency (min): rare (pt not feeling contractions)  ASSESSMENT Impression:  1. Pregnancy:  G2P1001 at [redacted]w[redacted]d , EDD Estimated Date of Delivery: 07/15/17 2.  Reactive 3.  Headache not associated with pre-E.  Likely migraine.  PLAN 1. Tylenol #3 given 2. Discharge home with precautions to return to L&D   3. Continue routine prenatal care.

## 2017-06-29 ENCOUNTER — Telehealth (HOSPITAL_COMMUNITY): Payer: Self-pay | Admitting: *Deleted

## 2017-06-29 ENCOUNTER — Encounter (HOSPITAL_COMMUNITY): Payer: Self-pay | Admitting: *Deleted

## 2017-06-29 LAB — OB RESULTS CONSOLE GBS: GBS: NEGATIVE

## 2017-06-29 NOTE — Telephone Encounter (Signed)
Preadmission screen  

## 2017-07-04 ENCOUNTER — Encounter (HOSPITAL_COMMUNITY): Payer: Self-pay | Admitting: *Deleted

## 2017-07-04 ENCOUNTER — Inpatient Hospital Stay (HOSPITAL_COMMUNITY)
Admission: AD | Admit: 2017-07-04 | Discharge: 2017-07-04 | Disposition: A | Discharge status: Home / Self Care | Payer: BLUE CROSS/BLUE SHIELD | Source: Ambulatory Visit | Attending: Obstetrics and Gynecology | Admitting: Obstetrics and Gynecology

## 2017-07-04 DIAGNOSIS — R0989 Other specified symptoms and signs involving the circulatory and respiratory systems: Secondary | ICD-10-CM | POA: Diagnosis present

## 2017-07-04 DIAGNOSIS — J069 Acute upper respiratory infection, unspecified: Secondary | ICD-10-CM | POA: Diagnosis not present

## 2017-07-04 DIAGNOSIS — O99513 Diseases of the respiratory system complicating pregnancy, third trimester: Secondary | ICD-10-CM | POA: Insufficient documentation

## 2017-07-04 DIAGNOSIS — O368130 Decreased fetal movements, third trimester, not applicable or unspecified: Secondary | ICD-10-CM | POA: Insufficient documentation

## 2017-07-04 DIAGNOSIS — Z3A38 38 weeks gestation of pregnancy: Secondary | ICD-10-CM | POA: Diagnosis not present

## 2017-07-04 DIAGNOSIS — Z880 Allergy status to penicillin: Secondary | ICD-10-CM | POA: Insufficient documentation

## 2017-07-04 LAB — RAPID STREP SCREEN (MED CTR MEBANE ONLY): STREPTOCOCCUS, GROUP A SCREEN (DIRECT): NEGATIVE

## 2017-07-04 LAB — INFLUENZA PANEL BY PCR (TYPE A & B)
INFLBPCR: NEGATIVE
Influenza A By PCR: NEGATIVE

## 2017-07-04 MED ORDER — PHENOL 1.4 % MT LIQD
1.0000 | OROMUCOSAL | Status: DC | PRN
  Administered 2017-07-04: 1 via OROMUCOSAL
  Filled 2017-07-04: qty 177

## 2017-07-04 MED ORDER — HYDROCOD POLST-CPM POLST ER 10-8 MG/5ML PO SUER: 5.0000 mL | Freq: Two times a day (BID) | ORAL | 0 refills | Status: DC | PRN

## 2017-07-04 NOTE — MAU Note (Signed)
Pt presents with c/o decreased FM.  Pt also requesting Flu test, states been running low grade fever, cough, chills, sore throat, and HA.  Reports taking Nyquil & Tylenol.
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
## 2017-07-04 NOTE — MAU Note (Signed)
Baby is not moving as much.  Having chills, denies fever (asked twice) cold symptoms, can't catch her breath.

## 2017-07-04 NOTE — MAU Note (Signed)
Urine in lab 

## 2017-07-06 LAB — CULTURE, GROUP A STREP (THRC)

## 2017-07-12 ENCOUNTER — Encounter (HOSPITAL_COMMUNITY): Payer: Self-pay

## 2017-07-12 ENCOUNTER — Inpatient Hospital Stay (HOSPITAL_COMMUNITY): Payer: BLUE CROSS/BLUE SHIELD | Admitting: Anesthesiology

## 2017-07-12 ENCOUNTER — Inpatient Hospital Stay (HOSPITAL_COMMUNITY)
Admission: RE | Admit: 2017-07-12 | Discharge: 2017-07-14 | DRG: 807 | Disposition: A | Discharge status: Home / Self Care | Payer: BLUE CROSS/BLUE SHIELD | Source: Ambulatory Visit | Attending: Obstetrics & Gynecology | Admitting: Obstetrics & Gynecology

## 2017-07-12 DIAGNOSIS — Z3490 Encounter for supervision of normal pregnancy, unspecified, unspecified trimester: Secondary | ICD-10-CM

## 2017-07-12 DIAGNOSIS — Z3A39 39 weeks gestation of pregnancy: Secondary | ICD-10-CM

## 2017-07-12 DIAGNOSIS — O26893 Other specified pregnancy related conditions, third trimester: Secondary | ICD-10-CM | POA: Diagnosis present

## 2017-07-12 LAB — CBC
HCT: 32.4 % — ABNORMAL LOW (ref 36.0–46.0)
Hemoglobin: 10.9 g/dL — ABNORMAL LOW (ref 12.0–15.0)
MCH: 29.5 pg (ref 26.0–34.0)
MCHC: 33.6 g/dL (ref 30.0–36.0)
MCV: 87.8 fL (ref 78.0–100.0)
PLATELETS: 348 10*3/uL (ref 150–400)
RBC: 3.69 MIL/uL — AB (ref 3.87–5.11)
RDW: 13.7 % (ref 11.5–15.5)
WBC: 12.3 10*3/uL — AB (ref 4.0–10.5)

## 2017-07-12 LAB — TYPE AND SCREEN
ABO/RH(D): O POS
ANTIBODY SCREEN: NEGATIVE

## 2017-07-12 LAB — RPR: RPR: NONREACTIVE

## 2017-07-12 MED ORDER — ONDANSETRON HCL 4 MG/2ML IJ SOLN: 4.0000 mg | INTRAMUSCULAR | Status: DC | PRN

## 2017-07-12 MED ORDER — OXYCODONE-ACETAMINOPHEN 5-325 MG PO TABS: 2.0000 | ORAL_TABLET | ORAL | Status: DC | PRN

## 2017-07-12 MED ORDER — LACTATED RINGERS IV SOLN: 500.0000 mL | INTRAVENOUS | Status: DC | PRN

## 2017-07-12 MED ORDER — SOD CITRATE-CITRIC ACID 500-334 MG/5ML PO SOLN: 30.0000 mL | ORAL | Status: DC | PRN

## 2017-07-12 MED ORDER — LIDOCAINE HCL (PF) 1 % IJ SOLN
INTRAMUSCULAR | Status: DC | PRN
  Administered 2017-07-12 (×2): 6 mL via EPIDURAL

## 2017-07-12 MED ORDER — ONDANSETRON HCL 4 MG PO TABS: 4.0000 mg | ORAL_TABLET | ORAL | Status: DC | PRN

## 2017-07-12 MED ORDER — TETANUS-DIPHTH-ACELL PERTUSSIS 5-2.5-18.5 LF-MCG/0.5 IM SUSP
0.5000 mL | Freq: Once | INTRAMUSCULAR | Status: DC
Start: 2017-07-13 — End: 2017-07-14

## 2017-07-12 MED ORDER — COCONUT OIL OIL
1.0000 "application " | TOPICAL_OIL | Status: DC | PRN
  Filled 2017-07-12: qty 120

## 2017-07-12 MED ORDER — FLEET ENEMA 7-19 GM/118ML RE ENEM: 1.0000 | ENEMA | RECTAL | Status: DC | PRN

## 2017-07-12 MED ORDER — ACETAMINOPHEN 325 MG PO TABS: 650.0000 mg | ORAL_TABLET | ORAL | Status: DC | PRN

## 2017-07-12 MED ORDER — LACTATED RINGERS IV SOLN
INTRAVENOUS | Status: DC
  Administered 2017-07-12 (×3): via INTRAVENOUS

## 2017-07-12 MED ORDER — PRENATAL MULTIVITAMIN CH
1.0000 | ORAL_TABLET | Freq: Every day | ORAL | Status: DC
  Administered 2017-07-13: 1 via ORAL
  Filled 2017-07-12: qty 1

## 2017-07-12 MED ORDER — BENZOCAINE-MENTHOL 20-0.5 % EX AERO: 1.0000 "application " | INHALATION_SPRAY | CUTANEOUS | Status: DC | PRN

## 2017-07-12 MED ORDER — WITCH HAZEL-GLYCERIN EX PADS: 1.0000 "application " | MEDICATED_PAD | CUTANEOUS | Status: DC | PRN

## 2017-07-12 MED ORDER — FENTANYL 2.5 MCG/ML BUPIVACAINE 1/10 % EPIDURAL INFUSION (WH - ANES)
14.0000 mL/h | INTRAMUSCULAR | Status: DC | PRN
  Administered 2017-07-12 (×2): 14 mL/h via EPIDURAL
  Filled 2017-07-12 (×2): qty 100

## 2017-07-12 MED ORDER — IBUPROFEN 600 MG PO TABS
600.0000 mg | ORAL_TABLET | Freq: Four times a day (QID) | ORAL | Status: DC
  Administered 2017-07-12 – 2017-07-14 (×7): 600 mg via ORAL
  Filled 2017-07-12 (×7): qty 1

## 2017-07-12 MED ORDER — SENNOSIDES-DOCUSATE SODIUM 8.6-50 MG PO TABS
2.0000 | ORAL_TABLET | ORAL | Status: DC
  Administered 2017-07-14: 2 via ORAL
  Filled 2017-07-12: qty 2

## 2017-07-12 MED ORDER — PHENYLEPHRINE 40 MCG/ML (10ML) SYRINGE FOR IV PUSH (FOR BLOOD PRESSURE SUPPORT)
80.0000 ug | PREFILLED_SYRINGE | INTRAVENOUS | Status: DC | PRN
  Filled 2017-07-12: qty 5
  Filled 2017-07-12 (×2): qty 10

## 2017-07-12 MED ORDER — OXYTOCIN 40 UNITS IN LACTATED RINGERS INFUSION - SIMPLE MED
1.0000 m[IU]/min | INTRAVENOUS | Status: DC
  Administered 2017-07-12: 2 m[IU]/min via INTRAVENOUS
  Filled 2017-07-12: qty 1000

## 2017-07-12 MED ORDER — FENTANYL CITRATE (PF) 100 MCG/2ML IJ SOLN
50.0000 ug | INTRAMUSCULAR | Status: DC | PRN
  Administered 2017-07-12: 50 ug via INTRAVENOUS
  Filled 2017-07-12: qty 2

## 2017-07-12 MED ORDER — MISOPROSTOL 25 MCG QUARTER TABLET
25.0000 ug | ORAL_TABLET | ORAL | Status: DC | PRN
  Administered 2017-07-12 (×2): 25 ug via VAGINAL
  Filled 2017-07-12 (×3): qty 1

## 2017-07-12 MED ORDER — LIDOCAINE HCL (PF) 1 % IJ SOLN
30.0000 mL | INTRAMUSCULAR | Status: DC | PRN
  Filled 2017-07-12: qty 30

## 2017-07-12 MED ORDER — ONDANSETRON HCL 4 MG/2ML IJ SOLN: 4.0000 mg | Freq: Four times a day (QID) | INTRAMUSCULAR | Status: DC | PRN

## 2017-07-12 MED ORDER — DIPHENHYDRAMINE HCL 50 MG/ML IJ SOLN: 12.5000 mg | INTRAMUSCULAR | Status: DC | PRN

## 2017-07-12 MED ORDER — EPHEDRINE 5 MG/ML INJ
10.0000 mg | INTRAVENOUS | Status: DC | PRN
  Filled 2017-07-12: qty 2

## 2017-07-12 MED ORDER — DIPHENHYDRAMINE HCL 25 MG PO CAPS: 25.0000 mg | ORAL_CAPSULE | Freq: Four times a day (QID) | ORAL | Status: DC | PRN

## 2017-07-12 MED ORDER — DIBUCAINE 1 % RE OINT: 1.0000 "application " | TOPICAL_OINTMENT | RECTAL | Status: DC | PRN

## 2017-07-12 MED ORDER — LACTATED RINGERS IV SOLN
500.0000 mL | Freq: Once | INTRAVENOUS | Status: AC
  Administered 2017-07-12: 500 mL via INTRAVENOUS

## 2017-07-12 MED ORDER — SIMETHICONE 80 MG PO CHEW: 80.0000 mg | CHEWABLE_TABLET | ORAL | Status: DC | PRN

## 2017-07-12 MED ORDER — OXYCODONE-ACETAMINOPHEN 5-325 MG PO TABS: 1.0000 | ORAL_TABLET | ORAL | Status: DC | PRN

## 2017-07-12 MED ORDER — TERBUTALINE SULFATE 1 MG/ML IJ SOLN
0.2500 mg | Freq: Once | INTRAMUSCULAR | Status: DC | PRN
  Filled 2017-07-12: qty 1

## 2017-07-12 MED ORDER — OXYTOCIN BOLUS FROM INFUSION
500.0000 mL | Freq: Once | INTRAVENOUS | Status: AC
  Administered 2017-07-12: 500 mL via INTRAVENOUS

## 2017-07-12 MED ORDER — PHENYLEPHRINE 40 MCG/ML (10ML) SYRINGE FOR IV PUSH (FOR BLOOD PRESSURE SUPPORT)
80.0000 ug | PREFILLED_SYRINGE | INTRAVENOUS | Status: DC | PRN
  Filled 2017-07-12: qty 5

## 2017-07-12 MED ORDER — SODIUM BICARBONATE 8.4 % IV SOLN
INTRAVENOUS | Status: DC | PRN
  Administered 2017-07-12: 5 mL via EPIDURAL

## 2017-07-12 MED ORDER — OXYTOCIN 40 UNITS IN LACTATED RINGERS INFUSION - SIMPLE MED: 2.5000 [IU]/h | INTRAVENOUS | Status: DC

## 2017-07-12 MED ORDER — LIDOCAINE-EPINEPHRINE (PF) 2 %-1:200000 IJ SOLN
INTRAMUSCULAR | Status: AC
  Filled 2017-07-12: qty 20

## 2017-07-12 MED ORDER — ZOLPIDEM TARTRATE 5 MG PO TABS: 5.0000 mg | ORAL_TABLET | Freq: Every evening | ORAL | Status: DC | PRN

## 2017-07-12 MED ORDER — TERBUTALINE SULFATE 1 MG/ML IJ SOLN
0.2500 mg | Freq: Once | INTRAMUSCULAR | Status: DC | PRN
Start: 2017-07-12 — End: 2017-07-12
  Filled 2017-07-12: qty 1

## 2017-07-12 MED ORDER — SODIUM BICARBONATE 8.4 % IV SOLN
INTRAVENOUS | Status: AC
  Filled 2017-07-12: qty 50

## 2017-07-12 NOTE — Anesthesia Postprocedure Evaluation (Signed)
Anesthesia Post Note  Patient: Nicole Evans  Procedure(s) Performed: AN AD HOC LABOR EPIDURAL     Patient location during evaluation: Mother Baby Anesthesia Type: Epidural Level of consciousness: awake and alert Pain management: pain level controlled Vital Signs Assessment: post-procedure vital signs reviewed and stable Respiratory status: spontaneous breathing, nonlabored ventilation and respiratory function stable Cardiovascular status: stable Postop Assessment: no headache, no backache and epidural receding Anesthetic complications: no    Last Vitals:  Vitals:   07/12/17 1731 07/12/17 1801  BP: 136/86 120/67  Pulse: (!) 101 (!) 101  Resp: 17 18  Temp:  37.1 C  SpO2:      Last Pain:  Vitals:   07/12/17 1801  TempSrc: Oral  PainSc: 0-No pain   Pain Goal:                 Joneric Streight

## 2017-07-12 NOTE — Anesthesia Pain Management Evaluation Note (Signed)
  CRNA Pain Management Visit Note  Patient: Nicole Evans, 21 y.o., female  "Hello I am a member of the anesthesia team at [facility]. We have an anesthesia team available at all times to provide care throughout the hospital, including epidural management and anesthesia for C-section. I don't know your plan for the delivery whether it a natural birth, water birth, IV sedation, nitrous supplementation, doula or epidural, but we want to meet your pain goals."   1.Was your pain managed to your expectations on prior hospitalizations?   Yes   2.What is your expectation for pain management during this hospitalization?     Epidural and IV pain meds  3.How can we help you reach that goal? Planning epidural, be available  Record the patient's initial score and the patient's pain goal.   Pain: 2  Pain Goal: 5 [facility] wants you to be able to say your pain was always managed very well.  [facility] Center[facility],Cheyrl Buley 07/12/2017
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
## 2017-07-12 NOTE — Anesthesia Preprocedure Evaluation (Signed)
Anesthesia Evaluation  Patient identified by MRN, date of birth, ID band Patient awake    Reviewed: Allergy & Precautions, H&P , NPO status , Patient's Chart, lab work & pertinent test results  Airway Mallampati: I  TM Distance: >3 FB Neck ROM: full    Dental no notable dental hx. (+) Teeth Intact   Pulmonary neg pulmonary ROS,    Pulmonary exam normal breath sounds clear to auscultation       Cardiovascular negative cardio ROS Normal cardiovascular exam Rhythm:regular Rate:Normal     Neuro/Psych    GI/Hepatic negative GI ROS, Neg liver ROS,   Endo/Other  negative endocrine ROS  Renal/GU negative Renal ROS     Musculoskeletal   Abdominal Normal abdominal exam  (+)   Peds  Hematology   Anesthesia Other Findings   Reproductive/Obstetrics (+) Pregnancy                             Anesthesia Physical Anesthesia Plan  ASA: II  Anesthesia Plan: Epidural   Post-op Pain Management:    Induction:   PONV Risk Score and Plan:   Airway Management Planned:   Additional Equipment:   Intra-op Plan:   Post-operative Plan:   Informed Consent: I have reviewed the patients History and Physical, chart, labs and discussed the procedure including the risks, benefits and alternatives for the proposed anesthesia with the patient or authorized representative who has indicated his/her understanding and acceptance.     Plan Discussed with:   Anesthesia Plan Comments:         Anesthesia Quick Evaluation

## 2017-07-12 NOTE — Anesthesia Procedure Notes (Signed)
Epidural Patient location during procedure: OB Start time: 07/12/2017 10:19 AM End time: 07/12/2017 10:22 AM  Staffing Anesthesiologist: Leilani AbleHATCHETT, [place] Leap Performed: anesthesiologist   Preanesthetic Checklist Completed: patient identified, surgical consent, pre-op evaluation, timeout performed, IV checked, risks and benefits discussed and monitors and equipment checked  Epidural Patient position: sitting Prep: site prepped and draped and DuraPrep Patient monitoring: continuous pulse ox and blood pressure Approach: midline Location: L3-L4 Injection technique: LOR air  Needle:  Needle type: Tuohy  Needle gauge: 17 G Needle length: 9 cm and 9 Needle insertion depth: 5 cm cm Catheter type: closed end flexible Catheter size: 19 Gauge Catheter at skin depth: 10 cm Test dose: negative and Other  Assessment Sensory level: T10 Events: blood not aspirated, injection not painful, no injection resistance, negative IV test and no paresthesia  Additional Notes Reason for block:procedure for pain

## 2017-07-13 LAB — CBC
HCT: 29 % — ABNORMAL LOW (ref 36.0–46.0)
Hemoglobin: 10.1 g/dL — ABNORMAL LOW (ref 12.0–15.0)
MCH: 31 pg (ref 26.0–34.0)
MCHC: 34.8 g/dL (ref 30.0–36.0)
MCV: 89 fL (ref 78.0–100.0)
PLATELETS: 300 10*3/uL (ref 150–400)
RBC: 3.26 MIL/uL — AB (ref 3.87–5.11)
RDW: 14.4 % (ref 11.5–15.5)
WBC: 22.1 10*3/uL — ABNORMAL HIGH (ref 4.0–10.5)

## 2017-07-13 NOTE — Lactation Note (Signed)
This note was copied from a baby's chart. Lactation Consultation Note  Patient Name: Nicole Evans JXBJY'NToday's Date: 07/13/2017 Reason for consult: Initial assessment;Term  Visited with P2 Mom at 22 hrs post delivery.  Mom has been latching and feeding baby often, >8 times since birth.  Mom had baby in football hold on right breast.  Great support at breast level, and Mom supporting her breast.  Mom complaining of some soreness on initial latch.  Baby taken off, and nipple noted to have small pinching noted on sides.  Reviewed and assisted with latch.  Mom had been bringing baby onto breast before baby's mouth was opened widely.  Colostrum easily expressed with hand expression.  Recommended this for nipple soreness, along with coconut oil.   Baby opened widely and LC assisted Mom with a deep latch onto areola.  Encouraged Mom to hold baby in closely.  Adjusted baby's angle, to facilitate baby held in closely and having adequate breathing space.  Demonstrated alternate breast compression, swallows identified for parents.   Reviewed basics.  Encouraged Mom to call for assistance as needed.   Recommended STS, and feeding baby often on cue.  Goal of >8 feedings per 24 hrs. Lactation brochure left in room.  Mom aware of OP lactation services available to her. To call prn.  Maternal Data Formula Feeding for Exclusion: No Has patient been taught Hand Expression?: Yes Does the patient have breastfeeding experience prior to this delivery?: Yes  Feeding Feeding Type: Breast Fed Length of feed: 20 min  LATCH Score Latch: Grasps breast easily, tongue down, lips flanged, rhythmical sucking.  Audible Swallowing: Spontaneous and intermittent  Type of Nipple: Everted at rest and after stimulation  Comfort (Breast/Nipple): Filling, red/small blisters or bruises, mild/mod discomfort  Hold (Positioning): Assistance needed to correctly position infant at breast and maintain latch.  LATCH Score:  8  Interventions Interventions: Breast feeding basics reviewed;Assisted with latch;Skin to skin;Breast massage;Hand express;Breast compression;Adjust position;Support pillows;Position options;Expressed milk;Coconut oil   Consult Status Consult Status: Follow-up Date: 07/14/17 Follow-up type: In-patient    Judee ClaraSmith, Sharah Finnell E 07/13/2017, 5:14 PM

## 2017-07-13 NOTE — Progress Notes (Signed)
Post Partum Day 1 Subjective: no complaints, up ad lib, voiding, tolerating PO and + flatus  Objective: Blood pressure (!) 127/53, pulse 98, temperature 98.7 F (37.1 C), temperature source Oral, resp. rate 18, height 5\' 5"  (1.651 m), weight 163 lb (73.9 kg), last menstrual period 09/14/2016, SpO2 99 %, unknown if currently breastfeeding.  Physical Exam:  General: alert, cooperative and no distress Lochia: appropriate Uterine Fundus: firm Incision: healing well DVT Evaluation: No evidence of DVT seen on physical exam.   Recent Labs  07/12/17 0050 07/13/17 0543  HGB 10.9* 10.1*  HCT 32.4* 29.0*    Assessment/Plan: Plan for discharge tomorrow   LOS: 1 day   Senita Corredor II,Vanette Noguchi E 07/13/2017, 8:21 AM
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
## 2017-07-13 NOTE — Anesthesia Postprocedure Evaluation (Signed)
Anesthesia Post Note  Patient: Sandford Crazelexis Wish  Procedure(s) Performed: AN AD HOC LABOR EPIDURAL     Patient location during evaluation: Mother Baby Anesthesia Type: Epidural Level of consciousness: awake and alert and oriented Pain management: satisfactory to patient Vital Signs Assessment: post-procedure vital signs reviewed and stable Respiratory status: spontaneous breathing and nonlabored ventilation Cardiovascular status: stable Postop Assessment: no headache, no backache, no signs of nausea or vomiting, adequate PO intake and patient able to bend at knees (patient up walking) Anesthetic complications: no    Last Vitals:  Vitals:   07/12/17 2210 07/13/17 0210  BP: (!) 124/58 (!) 127/53  Pulse: 79 98  Resp: 16 18  Temp: 37.1 C   SpO2:      Last Pain:  Vitals:   07/13/17 0552  TempSrc:   PainSc: 3    Pain Goal:                 Madison HickmanGREGORY,Jama Krichbaum

## 2017-07-13 NOTE — Addendum Note (Signed)
Addendum  created 07/13/17 [id number] by Shanon PayorGregory, Ashanti Littles M, CRNA   Charge Capture section accepted, Sign clinical note

## 2017-07-14 NOTE — Discharge Summary (Signed)
Obstetric Discharge Summary Reason for Admission: induction of labor Prenatal Procedures: none Intrapartum Procedures: spontaneous vaginal delivery Postpartum Procedures: none Complications-Operative and Postpartum: none Hemoglobin  Date Value Ref Range Status  07/13/2017 10.1 (L) 12.0 - 15.0 g/dL Final  21/30/865707/21/2016 84.610.8 (L) 11.1 - 15.9 g/dL Final   HCT  Date Value Ref Range Status  07/13/2017 29.0 (L) 36.0 - 46.0 % Final   Hematocrit  Date Value Ref Range Status  04/03/2015 33.3 (L) 34.0 - 46.6 % Final    Physical Exam:  General: alert, cooperative and appears stated age 31Lochia: appropriate Uterine Fundus: firm Incision: healing well, no significant drainage, no dehiscence DVT Evaluation: No evidence of DVT seen on physical exam.  Discharge Diagnoses: Term Pregnancy-delivered  Discharge Information: Date: 07/14/2017 Activity: pelvic rest Diet: routine Medications: None Condition: improved Instructions: refer to practice specific booklet Discharge to: home   Newborn Data: Live born female  Birth Weight: 6 lb 14.4 oz (3130 g) APGAR: 8, 9  Newborn Delivery   Birth date/time:  07/12/2017 18:36:00 Delivery type:  Vaginal, Spontaneous Delivery      Home with mother.  Zofia Peckinpaugh L 07/14/2017, 8:03 AM

## 2017-07-14 NOTE — Addendum Note (Signed)
Addendum  created 07/14/17 1101 by Bethena Midgetddono, Samaya Boardley, MD   Anesthesia Staff edited

## 2017-07-14 NOTE — Addendum Note (Signed)
Addendum  created 07/14/17 0933 by Bethena Midgetddono, Kashaun Bebo, MD   Anesthesia Event deleted, Anesthesia Event edited, Anesthesia Staff edited

## 2017-08-19 ENCOUNTER — Other Ambulatory Visit: Payer: Self-pay | Admitting: Family Medicine

## 2017-08-19 ENCOUNTER — Encounter: Payer: Self-pay | Admitting: Gastroenterology

## 2017-08-19 ENCOUNTER — Other Ambulatory Visit
Admission: RE | Admit: 2017-08-19 | Discharge: 2017-08-19 | Disposition: A | Discharge status: Home / Self Care | Payer: BLUE CROSS/BLUE SHIELD | Source: Ambulatory Visit | Attending: Gastroenterology | Admitting: Gastroenterology

## 2017-08-19 ENCOUNTER — Ambulatory Visit (INDEPENDENT_AMBULATORY_CARE_PROVIDER_SITE_OTHER): Payer: BLUE CROSS/BLUE SHIELD | Admitting: Gastroenterology

## 2017-08-19 ENCOUNTER — Encounter (INDEPENDENT_AMBULATORY_CARE_PROVIDER_SITE_OTHER): Payer: Self-pay

## 2017-08-19 VITALS — BP 120/75 | HR 77 | Temp 98.1°F | Wt 144.0 lb

## 2017-08-19 DIAGNOSIS — R1013 Epigastric pain: Secondary | ICD-10-CM

## 2017-08-19 DIAGNOSIS — R197 Diarrhea, unspecified: Secondary | ICD-10-CM | POA: Insufficient documentation

## 2017-08-19 DIAGNOSIS — R109 Unspecified abdominal pain: Secondary | ICD-10-CM | POA: Diagnosis not present

## 2017-08-19 DIAGNOSIS — E010 Iodine-deficiency related diffuse (endemic) goiter: Secondary | ICD-10-CM

## 2017-08-19 LAB — COMPREHENSIVE METABOLIC PANEL
ALT: 24 U/L (ref 14–54)
AST: 20 U/L (ref 15–41)
Albumin: 4.1 g/dL (ref 3.5–5.0)
Alkaline Phosphatase: 100 U/L (ref 38–126)
Anion gap: 8 (ref 5–15)
BUN: 19 mg/dL (ref 6–20)
CHLORIDE: 104 mmol/L (ref 101–111)
CO2: 25 mmol/L (ref 22–32)
Calcium: 9 mg/dL (ref 8.9–10.3)
Creatinine, Ser: 0.67 mg/dL (ref 0.44–1.00)
Glucose, Bld: 97 mg/dL (ref 65–99)
POTASSIUM: 4.2 mmol/L (ref 3.5–5.1)
SODIUM: 137 mmol/L (ref 135–145)
Total Bilirubin: 0.3 mg/dL (ref 0.3–1.2)
Total Protein: 7.9 g/dL (ref 6.5–8.1)

## 2017-08-19 LAB — CBC WITH DIFFERENTIAL/PLATELET
BASOS ABS: 0 10*3/uL (ref 0–0.1)
Basophils Relative: 1 %
EOS ABS: 0.1 10*3/uL (ref 0–0.7)
EOS PCT: 2 %
HCT: 37.5 % (ref 35.0–47.0)
Hemoglobin: 12.5 g/dL (ref 12.0–16.0)
LYMPHS PCT: 39 %
Lymphs Abs: 2.1 10*3/uL (ref 1.0–3.6)
MCH: 29.5 pg (ref 26.0–34.0)
MCHC: 33.2 g/dL (ref 32.0–36.0)
MCV: 89 fL (ref 80.0–100.0)
MONO ABS: 0.3 10*3/uL (ref 0.2–0.9)
Monocytes Relative: 6 %
Neutro Abs: 2.8 10*3/uL (ref 1.4–6.5)
Neutrophils Relative %: 52 %
PLATELETS: 297 10*3/uL (ref 150–440)
RBC: 4.22 MIL/uL (ref 3.80–5.20)
RDW: 13.6 % (ref 11.5–14.5)
WBC: 5.4 10*3/uL (ref 3.6–11.0)

## 2017-08-19 LAB — SEDIMENTATION RATE: SED RATE: 18 mm/h (ref 0–20)

## 2017-08-19 MED ORDER — POLYETHYLENE GLYCOL 3350 17 GM/SCOOP PO POWD: 1.0000 | Freq: Every day | ORAL | 3 refills | Status: DC

## 2017-08-19 NOTE — Progress Notes (Signed)
Patient is currently breastfeeding. 
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
## 2017-08-24 ENCOUNTER — Encounter: Payer: Self-pay | Admitting: Gastroenterology

## 2017-08-25 ENCOUNTER — Ambulatory Visit
Admission: RE | Admit: 2017-08-25 | Discharge: 2017-08-25 | Disposition: A | Discharge status: Home / Self Care | Payer: BLUE CROSS/BLUE SHIELD | Source: Ambulatory Visit | Attending: Family Medicine | Admitting: Family Medicine

## 2017-08-25 ENCOUNTER — Other Ambulatory Visit
Admission: RE | Admit: 2017-08-25 | Discharge: 2017-08-25 | Disposition: A | Discharge status: Home / Self Care | Payer: Managed Care, Other (non HMO) | Source: Ambulatory Visit | Attending: Gastroenterology | Admitting: Gastroenterology

## 2017-08-25 DIAGNOSIS — R197 Diarrhea, unspecified: Secondary | ICD-10-CM | POA: Insufficient documentation

## 2017-08-25 DIAGNOSIS — R1013 Epigastric pain: Secondary | ICD-10-CM | POA: Diagnosis present

## 2017-08-25 DIAGNOSIS — E041 Nontoxic single thyroid nodule: Secondary | ICD-10-CM | POA: Insufficient documentation

## 2017-08-25 DIAGNOSIS — E010 Iodine-deficiency related diffuse (endemic) goiter: Secondary | ICD-10-CM | POA: Diagnosis present

## 2017-08-25 LAB — C DIFFICILE QUICK SCREEN W PCR REFLEX
C DIFFICLE (CDIFF) ANTIGEN: NEGATIVE
C Diff interpretation: NOT DETECTED
C Diff toxin: NEGATIVE

## 2017-08-26 ENCOUNTER — Ambulatory Visit: Payer: BLUE CROSS/BLUE SHIELD | Admitting: Gastroenterology

## 2017-08-26 LAB — GASTROINTESTINAL PANEL BY PCR, STOOL (REPLACES STOOL CULTURE)
ASTROVIRUS: NOT DETECTED
Adenovirus F40/41: NOT DETECTED
CYCLOSPORA CAYETANENSIS: NOT DETECTED
Campylobacter species: NOT DETECTED
Cryptosporidium: NOT DETECTED
ENTAMOEBA HISTOLYTICA: NOT DETECTED
ENTEROTOXIGENIC E COLI (ETEC): NOT DETECTED
Enteroaggregative E coli (EAEC): NOT DETECTED
Enteropathogenic E coli (EPEC): DETECTED — AB
Giardia lamblia: NOT DETECTED
Norovirus GI/GII: NOT DETECTED
Plesimonas shigelloides: NOT DETECTED
Rotavirus A: NOT DETECTED
SAPOVIRUS (I, II, IV, AND V): NOT DETECTED
SHIGA LIKE TOXIN PRODUCING E COLI (STEC): NOT DETECTED
Salmonella species: NOT DETECTED
Shigella/Enteroinvasive E coli (EIEC): NOT DETECTED
VIBRIO CHOLERAE: NOT DETECTED
VIBRIO SPECIES: NOT DETECTED
Yersinia enterocolitica: NOT DETECTED

## 2017-08-28 LAB — CALPROTECTIN, FECAL: CALPROTECTIN, FECAL: 39 ug/g (ref 0–120)

## 2017-08-31 ENCOUNTER — Other Ambulatory Visit (HOSPITAL_COMMUNITY): Payer: Self-pay | Admitting: Obstetrics & Gynecology

## 2017-08-31 DIAGNOSIS — E041 Nontoxic single thyroid nodule: Secondary | ICD-10-CM

## 2017-11-07 ENCOUNTER — Ambulatory Visit: Payer: Managed Care, Other (non HMO) | Admitting: Internal Medicine

## 2017-12-16 ENCOUNTER — Ambulatory Visit: Payer: Managed Care, Other (non HMO) | Admitting: Internal Medicine

## 2017-12-27 ENCOUNTER — Encounter: Payer: Self-pay | Admitting: Family Medicine

## 2017-12-27 ENCOUNTER — Ambulatory Visit: Payer: BLUE CROSS/BLUE SHIELD | Admitting: Family Medicine

## 2017-12-27 VITALS — BP 116/70 | HR 88 | Temp 98.5°F | Ht 64.5 in | Wt 132.6 lb

## 2017-12-27 DIAGNOSIS — Z7689 Persons encountering health services in other specified circumstances: Secondary | ICD-10-CM | POA: Diagnosis not present

## 2017-12-27 DIAGNOSIS — E049 Nontoxic goiter, unspecified: Secondary | ICD-10-CM | POA: Diagnosis not present

## 2017-12-27 DIAGNOSIS — Z8669 Personal history of other diseases of the nervous system and sense organs: Secondary | ICD-10-CM

## 2017-12-27 NOTE — Patient Instructions (Signed)
Goiter A goiter is an enlarged thyroid gland. The thyroid gland is located in the lower front of the neck. The gland produces hormones that regulate mood, body temperature, pulse rate, and digestion. Most goiters are painless and are not a cause for serious concern. Goiters and conditions that cause goiters can be treated, if necessary. What are the causes? Causes of this condition include:  Diseases that attack healthy cells in your body (autoimmune diseases) and affect your thyroid function, such as: ? Graves disease. This causes too much thyroid hormone to be produced and it makes your thyroid overly active (hyperthyroidism). ? Hashimoto disease. This type of inflammation of the thyroid (thyroiditis) causes too little thyroid hormone to be produced and it makes your thyroid not active enough (hypothyroidism).  Other conditions that cause thyroiditis.  Nodular goiter. This means that there are one or more small growths on your thyroid. These can create too much thyroid hormone.  Pregnancy.  Thyroid cancer. This is rare.  Certain medicines.  Radiation exposure.  Iodine deficiency.  In some cases, the cause may not be known (idiopathic). What increases the risk? This condition is more likely to develop in:  People who have a family history of goiter.  Women.  People who do not get enough iodine in their diet.  People who are older than 84.  People who smoke tobacco.  What are the signs or symptoms? Common symptoms of this condition include:  Swelling in the lower part of the neck. This swelling can range from a very small bump to a large lump.  A tight feeling in the throat.  A hoarse voice.  Other symptoms include:  Coughing.  Wheezing.  Difficulty swallowing.  Difficulty breathing.  Bulging neck veins.  Dizziness.  In some cases, there are no symptoms and thyroid hormone levels may be normal. When a goiter is the result of hyperthyroidism, symptoms may  also include:  Nervousness or restlessness.  Inability to tolerate heat.  Unexplained weight loss.  Diarrhea.  Change in the texture of hair or skin.  Changes in heart beat, such as skipped beats, extra beats, or a rapid heart rate.  Loss of menstruation.  Shaky hands.  Increased appetite.  Sleep problems.  When a goiter is the result of hypothyroidism, symptoms may also include:  Feeling like you have no energy (lethargy).  Inability to tolerate cold.  Weight gain that is not explained by a change in diet or exercise habits.  Dry skin.  Coarse hair.  Menstrual irregularity.  Constipation.  Sadness or depression.  How is this diagnosed? This condition may be diagnosed with a medical history and physical exam. You may also have other tests, including:  Blood tests to check thyroid function.  Imaging tests, such as: ? Ultrasonography. ? CT scan. ? MRI. ? Thyroid scan. You will be given a safe radioactive injection, then images will be taken of your thyroid.  Tissue sample (biopsy) of the goiter or any nodules. This checks to see if the goiter or nodules are cancerous.  How is this treated? Treatment for this condition depends on the cause. Treatment may include:  Medicines to control your thyroid.  Anti-inflammatory or steroid medicines, if inflammation is the cause.  Iodine supplements or changes in diet, if the goiter is caused by iodine deficiency.  Radiation therapy.  Surgery to remove your thyroid.  In some cases, no treatment is necessary, and your health care provider will monitor your condition at regular checkups. Follow these instructions at  home:  Follow recommendations from your health care provider for any changes to your diet.  Take over-the-counter and prescription medicines only as told by your health care provider.  Do not use any tobacco products, including cigarettes, chewing tobacco, or e-cigarettes. If you need help quitting,  ask your health care provider.  Keep all follow-up appointments as told by your health care provider. This is important. Contact a health care provider if:  Your symptoms do not get better with treatment. Get help right away if:  You develop sudden, unexplained confusion or other mental changes.  You have nausea, vomiting, or diarrhea.  You develop a fever.  Your skin or the whites of your eyes appear yellow (jaundice).  You develop chest pain.  You have trouble breathing or swallowing.  You suddenly become very weak.  You experience extreme restlessness. This information is not intended to replace advice given to you by your health care provider. Make sure you discuss any questions you have with your health care provider. Document Released: 02/17/2010 Document Revised: 03/19/2016 Document Reviewed: 08/26/2014 Elsevier Interactive Patient Education  2018 [employer]  Migraine Headache A migraine headache is an intense, throbbing pain on one side or both sides of the head. Migraines may also cause other symptoms, such as nausea, vomiting, and sensitivity to light and noise. What are the causes? Doing or taking certain things may also trigger migraines, such as:  Alcohol.  Smoking.  Medicines, such as: ? Medicine used to treat chest pain (nitroglycerine). ? Birth control pills. ? Estrogen pills. ? Certain blood pressure medicines.  Aged cheeses, chocolate, or caffeine.  Foods or drinks that contain nitrates, glutamate, aspartame, or tyramine.  Physical activity.  Other things that may trigger a migraine include:  Menstruation.  Pregnancy.  Hunger.  Stress, lack of sleep, too much sleep, or fatigue.  Weather changes.  What increases the risk? The following factors may make you more likely to experience migraine headaches:  Age. Risk increases with age.  Family history of migraine headaches.  Being Caucasian.  Depression and  anxiety.  Obesity.  Being a woman.  Having a hole in the heart (patent foramen ovale) or other heart problems.  What are the signs or symptoms? The main symptom of this condition is pulsating or throbbing pain. Pain may:  Happen in any area of the head, such as on one side or both sides.  Interfere with daily activities.  Get worse with physical activity.  Get worse with exposure to bright lights or loud noises.  Other symptoms may include:  Nausea.  Vomiting.  Dizziness.  General sensitivity to bright lights, loud noises, or smells.  Before you get a migraine, you may get warning signs that a migraine is developing (aura). An aura may include:  Seeing flashing lights or having blind spots.  Seeing bright spots, halos, or zigzag lines.  Having tunnel vision or blurred vision.  Having numbness or a tingling feeling.  Having trouble talking.  Having muscle weakness.  How is this diagnosed? A migraine headache can be diagnosed based on:  Your symptoms.  A physical exam.  Tests, such as CT scan or MRI of the head. These imaging tests can help rule out other causes of headaches.  Taking fluid from the spine (lumbar puncture) and analyzing it (cerebrospinal fluid analysis, or CSF analysis).  How is this treated? A migraine headache is usually treated with medicines that:  Relieve pain.  Relieve nausea.  Prevent migraines from coming back.  Treatment  may also include:  Acupuncture.  Lifestyle changes like avoiding foods that trigger migraines.  Follow these instructions at home: Medicines  Take over-the-counter and prescription medicines only as told by your health care provider.  Do not drive or use heavy machinery while taking prescription pain medicine.  To prevent or treat constipation while you are taking prescription pain medicine, your health care provider may recommend that you: ? Drink enough fluid to keep your urine clear or pale  yellow. ? Take over-the-counter or prescription medicines. ? Eat foods that are high in fiber, such as fresh fruits and vegetables, whole grains, and beans. ? Limit foods that are high in fat and processed sugars, such as fried and sweet foods. Lifestyle  Avoid alcohol use.  Do not use any products that contain nicotine or tobacco, such as cigarettes and e-cigarettes. If you need help quitting, ask your health care provider.  Get at least 8 hours of sleep every night.  Limit your stress. General instructions   Keep a journal to find out what may trigger your migraine headaches. For example, write down: ? What you eat and drink. ? How much sleep you get. ? Any change to your diet or medicines.  If you have a migraine: ? Avoid things that make your symptoms worse, such as bright lights. ? It may help to lie down in a dark, quiet room. ? Do not drive or use heavy machinery. ? Ask your health care provider what activities are safe for you while you are experiencing symptoms.  Keep all follow-up visits as told by your health care provider. This is important. Contact a health care provider if:  You develop symptoms that are different or more severe than your usual migraine symptoms. Get help right away if:  Your migraine becomes severe.  You have a fever.  You have a stiff neck.  You have vision loss.  Your muscles feel weak or like you cannot control them.  You start to lose your balance often.  You develop trouble walking.  You faint. This information is not intended to replace advice given to you by your health care provider. Make sure you discuss any questions you have with your health care provider. Document Released: 08/30/2005 Document Revised: 03/19/2016 Document Reviewed: 02/16/2016 Elsevier Interactive Patient Education  2017 [employer].
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
## 2017-12-28 LAB — TSH: TSH: 0.6 u[IU]/mL (ref 0.35–4.50)

## 2017-12-28 LAB — T4, FREE: Free T4: 0.9 ng/dL (ref 0.60–1.60)

## 2018-01-09 ENCOUNTER — Encounter (HOSPITAL_COMMUNITY): Payer: Self-pay

## 2018-01-09 ENCOUNTER — Ambulatory Visit (HOSPITAL_COMMUNITY): Payer: Managed Care, Other (non HMO)

## 2018-05-04 ENCOUNTER — Encounter: Payer: Self-pay | Admitting: Family Medicine

## 2018-05-04 ENCOUNTER — Ambulatory Visit (INDEPENDENT_AMBULATORY_CARE_PROVIDER_SITE_OTHER): Payer: BLUE CROSS/BLUE SHIELD | Admitting: Family Medicine

## 2018-05-04 VITALS — BP 96/82 | HR 102 | Temp 98.5°F | Wt 123.0 lb

## 2018-05-04 DIAGNOSIS — G629 Polyneuropathy, unspecified: Secondary | ICD-10-CM

## 2018-05-04 DIAGNOSIS — R42 Dizziness and giddiness: Secondary | ICD-10-CM | POA: Diagnosis not present
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
## 2018-05-04 NOTE — Patient Instructions (Signed)
Neuropathic Pain Neuropathic pain is pain caused by damage to the nerves that are responsible for certain sensations in your body (sensory nerves). The pain can be caused by damage to:  The sensory nerves that send signals to your spinal cord and brain (peripheral nervous system).  The sensory nerves in your brain or spinal cord (central nervous system).  Neuropathic pain can make you more sensitive to pain. What would be a minor sensation for most people may feel very painful if you have neuropathic pain. This is usually a long-term condition that can be difficult to treat. The type of pain can differ from person to person. It may start suddenly (acute), or it may develop slowly and last for a long time (chronic). Neuropathic pain may come and go as damaged nerves heal or may stay at the same level for years. It often causes emotional distress, loss of sleep, and a lower quality of life. What are the causes? The most common cause of damage to a sensory nerve is diabetes. Many other diseases and conditions can also cause neuropathic pain. Causes of neuropathic pain can be classified as:  Toxic. Many drugs and chemicals can cause toxic damage. The most common cause of toxic neuropathic pain is damage from drug treatment for cancer (chemotherapy).  Metabolic. This type of pain can happen when a disease causes imbalances that damage nerves. Diabetes is the most common of these diseases. Vitamin B deficiency caused by long-term alcohol abuse is another common cause.  Traumatic. Any injury that cuts, crushes, or stretches a nerve can cause damage and pain. A common example is feeling pain after losing an arm or leg (phantom limb pain).  Compression-related. If a sensory nerve gets trapped or compressed for a long period of time, the blood supply to the nerve can be cut off.  Vascular. Many blood vessel diseases can cause neuropathic pain by decreasing blood supply and oxygen to nerves.  Autoimmune.  This type of pain results from diseases in which the body's defense system mistakenly attacks sensory nerves. Examples of autoimmune diseases that can cause neuropathic pain include lupus and multiple sclerosis.  Infectious. Many types of viral infections can damage sensory nerves and cause pain. Shingles infection is a common cause of this type of pain.  Inherited. Neuropathic pain can be a symptom of many diseases that are passed down through families (genetic).  What are the signs or symptoms? The main symptom is pain. Neuropathic pain is often described as:  Burning.  Shock-like.  Stinging.  Hot or cold.  Itching.  How is this diagnosed? No single test can diagnose neuropathic pain. Your health care provider will do a physical exam and ask you about your pain. You may use a pain scale to describe how bad your pain is. You may also have tests to see if you have a high sensitivity to pain and to help find the cause and location of any sensory nerve damage. These tests may include:  Imaging studies, such as: ? X-rays. ? CT scan. ? MRI.  Nerve conduction studies to test how well nerve signals travel through your sensory nerves (electrodiagnostic testing).  Stimulating your sensory nerves through electrodes on your skin and measuring the response in your spinal cord and brain (somatosensory evoked potentials).  How is this treated? Treatment for neuropathic pain may change over time. You may need to try different treatment options or a combination of treatments. Some options include:  Over-the-counter pain relievers.  Prescription medicines. Some medicines   used to treat other conditions may also help neuropathic pain. These include medicines to: ? Control seizures (anticonvulsants). ? Relieve depression (antidepressants).  Prescription-strength pain relievers (narcotics). These are usually used when other pain relievers do not help.  Transcutaneous nerve stimulation (TENS).  This uses electrical currents to block painful nerve signals. The treatment is painless.  Topical and local anesthetics. These are medicines that numb the nerves. They can be injected as a nerve block or applied to the skin.  Alternative treatments, such as: ? Acupuncture. ? Meditation. ? Massage. ? Physical therapy. ? Pain management programs. ? Counseling.  Follow these instructions at home:  Learn as much as you can about your condition.  Take medicines only as directed by your health care provider.  Work closely with all your health care providers to find what works best for you.  Have a good support system at home.  Consider joining a chronic pain support group. Contact a health care provider if:  Your pain treatments are not helping.  You are having side effects from your medicines.  You are struggling with fatigue, mood changes, depression, or anxiety. This information is not intended to replace advice given to you by your health care provider. Make sure you discuss any questions you have with your health care provider. Document Released: 05/27/2004 Document Revised: 03/19/2016 Document Reviewed: 02/07/2014 Elsevier Interactive Patient Education  2018 [employer] Inc[employer]  

## 2018-05-05 LAB — CBC WITH DIFFERENTIAL/PLATELET
BASOS: 2 %
Basophils Absolute: 0.1 10*3/uL (ref 0.0–0.2)
EOS (ABSOLUTE): 0.1 10*3/uL (ref 0.0–0.4)
EOS: 1 %
HEMATOCRIT: 36.3 % (ref 34.0–46.6)
Hemoglobin: 11.8 g/dL (ref 11.1–15.9)
Immature Grans (Abs): 0 10*3/uL (ref 0.0–0.1)
Immature Granulocytes: 0 %
LYMPHS ABS: 2.1 10*3/uL (ref 0.7–3.1)
Lymphs: 43 %
MCH: 28.9 pg (ref 26.6–33.0)
MCHC: 32.5 g/dL (ref 31.5–35.7)
MCV: 89 fL (ref 79–97)
MONOS ABS: 0.7 10*3/uL (ref 0.1–0.9)
Monocytes: 15 %
NEUTROS PCT: 39 %
Neutrophils Absolute: 1.9 10*3/uL (ref 1.4–7.0)
PLATELETS: 298 10*3/uL (ref 150–450)
RBC: 4.08 x10E6/uL (ref 3.77–5.28)
RDW: 13.3 % (ref 12.3–15.4)
WBC: 4.9 10*3/uL (ref 3.4–10.8)

## 2018-05-05 LAB — VITAMIN B12: VITAMIN B 12: 1091 pg/mL (ref 232–1245)

## 2018-05-05 LAB — BASIC METABOLIC PANEL
BUN / CREAT RATIO: 11 (ref 9–23)
BUN: 8 mg/dL (ref 6–20)
CO2: 22 mmol/L (ref 20–29)
CREATININE: 0.72 mg/dL (ref 0.57–1.00)
Calcium: 9.3 mg/dL (ref 8.7–10.2)
Chloride: 102 mmol/L (ref 96–106)
GFR calc Af Amer: 137 mL/min/{1.73_m2} (ref >59)
GFR calc non Af Amer: 118 mL/min/{1.73_m2} (ref >59)
Glucose: 91 mg/dL (ref 65–99)
POTASSIUM: 4.4 mmol/L (ref 3.5–5.2)
SODIUM: 138 mmol/L (ref 134–144)

## 2018-05-05 LAB — TSH: TSH: 0.797 u[IU]/mL (ref 0.450–4.500)

## 2018-05-05 LAB — HEMOGLOBIN A1C
Est. average glucose Bld gHb Est-mCnc: 111 mg/dL
Hgb A1c MFr Bld: 5.5 % (ref 4.8–5.6)

## 2018-05-05 LAB — T4, FREE: Free T4: 1.41 ng/dL (ref 0.82–1.77)

## 2018-05-05 LAB — FOLATE: Folate: 11.4 ng/mL (ref >3.0)

## 2018-05-10 ENCOUNTER — Encounter: Payer: Self-pay | Admitting: Family Medicine

## 2018-08-17 ENCOUNTER — Other Ambulatory Visit (INDEPENDENT_AMBULATORY_CARE_PROVIDER_SITE_OTHER): Payer: Self-pay | Admitting: Family Medicine

## 2018-08-17 ENCOUNTER — Encounter (INDEPENDENT_AMBULATORY_CARE_PROVIDER_SITE_OTHER): Payer: Self-pay | Admitting: Family Medicine

## 2018-08-17 ENCOUNTER — Ambulatory Visit (INDEPENDENT_AMBULATORY_CARE_PROVIDER_SITE_OTHER): Payer: BLUE CROSS/BLUE SHIELD | Admitting: Family Medicine

## 2018-08-17 DIAGNOSIS — Z8669 Personal history of other diseases of the nervous system and sense organs: Secondary | ICD-10-CM | POA: Diagnosis not present

## 2018-08-17 DIAGNOSIS — M2550 Pain in unspecified joint: Secondary | ICD-10-CM

## 2018-08-17 DIAGNOSIS — Z8269 Family history of other diseases of the musculoskeletal system and connective tissue: Secondary | ICD-10-CM | POA: Diagnosis not present

## 2018-08-17 DIAGNOSIS — L608 Other nail disorders: Secondary | ICD-10-CM

## 2018-08-17 DIAGNOSIS — G8929 Other chronic pain: Secondary | ICD-10-CM
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
## 2018-08-19 LAB — COMPREHENSIVE METABOLIC PANEL
A/G RATIO: 1.6 (ref 1.2–2.2)
ALBUMIN: 4.4 g/dL (ref 3.5–5.5)
ALK PHOS: 88 IU/L (ref 39–117)
ALT: 21 IU/L (ref 0–32)
AST: 10 IU/L (ref 0–40)
BILIRUBIN TOTAL: 0.7 mg/dL (ref 0.0–1.2)
BUN/Creatinine Ratio: 23 (ref 9–23)
BUN: 17 mg/dL (ref 6–20)
CALCIUM: 9.4 mg/dL (ref 8.7–10.2)
CHLORIDE: 102 mmol/L (ref 96–106)
CO2: 23 mmol/L (ref 20–29)
Creatinine, Ser: 0.73 mg/dL (ref 0.57–1.00)
GFR, EST AFRICAN AMERICAN: 134 mL/min/{1.73_m2} (ref >59)
GFR, EST NON AFRICAN AMERICAN: 116 mL/min/{1.73_m2} (ref >59)
Globulin, Total: 2.7 g/dL (ref 1.5–4.5)
Glucose: 106 mg/dL — ABNORMAL HIGH (ref 65–99)
POTASSIUM: 4.2 mmol/L (ref 3.5–5.2)
Sodium: 139 mmol/L (ref 134–144)
Total Protein: 7.1 g/dL (ref 6.0–8.5)

## 2018-08-19 LAB — SEDIMENTATION RATE: Sed Rate: 14 mm/hr (ref 0–32)

## 2018-08-19 LAB — ZINC: Zinc: 76 ug/dL (ref 56–134)

## 2018-08-19 LAB — CBC WITH DIFFERENTIAL/PLATELET
Basophils Absolute: 0 10*3/uL (ref 0.0–0.2)
Basos: 1 %
EOS (ABSOLUTE): 0.1 10*3/uL (ref 0.0–0.4)
Eos: 2 %
Hematocrit: 37.2 % (ref 34.0–46.6)
Hemoglobin: 12.1 g/dL (ref 11.1–15.9)
IMMATURE GRANS (ABS): 0 10*3/uL (ref 0.0–0.1)
IMMATURE GRANULOCYTES: 0 %
Lymphocytes Absolute: 2.1 10*3/uL (ref 0.7–3.1)
Lymphs: 54 %
MCH: 28.9 pg (ref 26.6–33.0)
MCHC: 32.5 g/dL (ref 31.5–35.7)
MCV: 89 fL (ref 79–97)
Monocytes Absolute: 0.2 10*3/uL (ref 0.1–0.9)
Monocytes: 6 %
NEUTROS PCT: 37 %
Neutrophils Absolute: 1.4 10*3/uL (ref 1.4–7.0)
PLATELETS: 362 10*3/uL (ref 150–450)
RBC: 4.19 x10E6/uL (ref 3.77–5.28)
RDW: 12.7 % (ref 12.3–15.4)
WBC: 3.9 10*3/uL (ref 3.4–10.8)

## 2018-08-19 LAB — THYROID PANEL WITH TSH
FREE THYROXINE INDEX: 2 (ref 1.2–4.9)
T3 UPTAKE RATIO: 28 % (ref 24–39)
T4, Total: 7.2 ug/dL (ref 4.5–12.0)
TSH: 0.33 u[IU]/mL — ABNORMAL LOW (ref 0.450–4.500)

## 2018-08-19 LAB — CYCLIC CITRUL PEPTIDE ANTIBODY, IGG/IGA: CYCLIC CITRULLIN PEPTIDE AB: 10 U (ref 0–19)

## 2018-08-19 LAB — RHEUMATOID FACTOR: Rhuematoid fact SerPl-aCnc: <10 IU/mL (ref 0.0–13.9)

## 2018-08-19 LAB — VITAMIN D 25 HYDROXY (VIT D DEFICIENCY, FRACTURES): VIT D 25 HYDROXY: 8.5 ng/mL — AB (ref 30.0–100.0)

## 2018-08-19 LAB — ANA: ANA: NEGATIVE

## 2018-08-19 LAB — THYROID PEROXIDASE ANTIBODY: Thyroperoxidase Ab SerPl-aCnc: 21 IU/mL (ref 0–34)

## 2018-08-19 LAB — COPPER, SERUM: COPPER: 76 ug/dL (ref 72–166)

## 2018-08-24 ENCOUNTER — Telehealth (INDEPENDENT_AMBULATORY_CARE_PROVIDER_SITE_OTHER): Payer: Self-pay | Admitting: Family Medicine

## 2018-08-24 DIAGNOSIS — E559 Vitamin D deficiency, unspecified: Secondary | ICD-10-CM

## 2018-08-24 DIAGNOSIS — E079 Disorder of thyroid, unspecified: Secondary | ICD-10-CM

## 2018-08-24 NOTE — Telephone Encounter (Signed)
Labs are notable for the following:  Thyroid TSH is slightly low at 0.33.  This could indicate hyperthyroidism.  The other thyroid labs look good.  I recommend rechecking this in 2 to 3 months sure it is back in normal range.  Vitamin D is severely low at 8.5.  We want this to be 50-80.  This could explain chronic pain.  I recommend taking vitamin D-3, 5000 IU tablets, 2 of them daily for a total of 10,000 IU daily.  We will do this for 3 months and then recheck the levels.  Once in target range, we will drop down to a lower maintenance dosage, most likely 5000 IU daily.  Other labs look good.

## 2018-08-24 NOTE — Telephone Encounter (Signed)
Left message on patient's mobile voice mail to call back. 

## 2018-08-28 NOTE — Telephone Encounter (Signed)
I spoke with the patient.  She did see the message from Dr. Prince RomeHilts and will start taking vitamin D as instructed.  She will call us back at the 2-3 months mark for us to provide her with a new bloodwork order to recheck levels (she prefers to have this checked at [facility]).

## 2018-09-01 ENCOUNTER — Inpatient Hospital Stay (HOSPITAL_BASED_OUTPATIENT_CLINIC_OR_DEPARTMENT_OTHER)
Admission: EM | Admit: 2018-09-01 | Discharge: 2018-09-05 | DRG: 092 | Disposition: A | Discharge status: Home / Self Care | Payer: BLUE CROSS/BLUE SHIELD | Attending: Family Medicine | Admitting: Family Medicine

## 2018-09-01 ENCOUNTER — Emergency Department (HOSPITAL_BASED_OUTPATIENT_CLINIC_OR_DEPARTMENT_OTHER): Payer: BLUE CROSS/BLUE SHIELD

## 2018-09-01 ENCOUNTER — Encounter (HOSPITAL_BASED_OUTPATIENT_CLINIC_OR_DEPARTMENT_OTHER): Payer: Self-pay | Admitting: *Deleted

## 2018-09-01 ENCOUNTER — Other Ambulatory Visit: Payer: Self-pay

## 2018-09-01 DIAGNOSIS — R200 Anesthesia of skin: Secondary | ICD-10-CM | POA: Diagnosis not present

## 2018-09-01 DIAGNOSIS — I959 Hypotension, unspecified: Secondary | ICD-10-CM | POA: Diagnosis present

## 2018-09-01 DIAGNOSIS — R944 Abnormal results of kidney function studies: Secondary | ICD-10-CM | POA: Diagnosis not present

## 2018-09-01 DIAGNOSIS — M79609 Pain in unspecified limb: Secondary | ICD-10-CM | POA: Diagnosis present

## 2018-09-01 DIAGNOSIS — D72819 Decreased white blood cell count, unspecified: Secondary | ICD-10-CM | POA: Diagnosis not present

## 2018-09-01 DIAGNOSIS — R42 Dizziness and giddiness: Secondary | ICD-10-CM

## 2018-09-01 DIAGNOSIS — M436 Torticollis: Secondary | ICD-10-CM | POA: Diagnosis not present

## 2018-09-01 DIAGNOSIS — G8929 Other chronic pain: Secondary | ICD-10-CM | POA: Diagnosis present

## 2018-09-01 DIAGNOSIS — R51 Headache: Secondary | ICD-10-CM | POA: Diagnosis not present

## 2018-09-01 DIAGNOSIS — E871 Hypo-osmolality and hyponatremia: Secondary | ICD-10-CM | POA: Diagnosis present

## 2018-09-01 DIAGNOSIS — Z880 Allergy status to penicillin: Secondary | ICD-10-CM

## 2018-09-01 DIAGNOSIS — Z62810 Personal history of physical and sexual abuse in childhood: Secondary | ICD-10-CM | POA: Diagnosis not present

## 2018-09-01 DIAGNOSIS — R269 Unspecified abnormalities of gait and mobility: Secondary | ICD-10-CM

## 2018-09-01 DIAGNOSIS — R202 Paresthesia of skin: Principal | ICD-10-CM | POA: Diagnosis present

## 2018-09-01 DIAGNOSIS — E559 Vitamin D deficiency, unspecified: Secondary | ICD-10-CM | POA: Diagnosis not present

## 2018-09-01 HISTORY — DX: Dyspnea, unspecified: R06.00

## 2018-09-01 LAB — CBC WITH DIFFERENTIAL/PLATELET
Abs Immature Granulocytes: 0.01 10*3/uL (ref 0.00–0.07)
Basophils Absolute: 0 10*3/uL (ref 0.0–0.1)
Basophils Relative: 1 %
Eosinophils Absolute: 0.1 10*3/uL (ref 0.0–0.5)
Eosinophils Relative: 3 %
HCT: 35.7 % — ABNORMAL LOW (ref 36.0–46.0)
Hemoglobin: 11.3 g/dL — ABNORMAL LOW (ref 12.0–15.0)
Immature Granulocytes: 0 %
Lymphocytes Relative: 42 %
Lymphs Abs: 1.3 10*3/uL (ref 0.7–4.0)
MCH: 28.6 pg (ref 26.0–34.0)
MCHC: 31.7 g/dL (ref 30.0–36.0)
MCV: 90.4 fL (ref 80.0–100.0)
Monocytes Absolute: 0.3 10*3/uL (ref 0.1–1.0)
Monocytes Relative: 11 %
Neutro Abs: 1.4 10*3/uL — ABNORMAL LOW (ref 1.7–7.7)
Neutrophils Relative %: 43 %
Platelets: 325 10*3/uL (ref 150–400)
RBC: 3.95 MIL/uL (ref 3.87–5.11)
RDW: 13.7 % (ref 11.5–15.5)
WBC: 3.2 10*3/uL — ABNORMAL LOW (ref 4.0–10.5)
nRBC: 0 % (ref 0.0–0.2)

## 2018-09-01 LAB — URINALYSIS, MICROSCOPIC (REFLEX): RBC / HPF: NONE SEEN RBC/hpf (ref 0–5)

## 2018-09-01 LAB — COMPREHENSIVE METABOLIC PANEL
ALT: 21 U/L (ref 0–44)
AST: 15 U/L (ref 15–41)
Albumin: 4.2 g/dL (ref 3.5–5.0)
Alkaline Phosphatase: 72 U/L (ref 38–126)
Anion gap: 5 (ref 5–15)
BUN: 21 mg/dL — ABNORMAL HIGH (ref 6–20)
CO2: 22 mmol/L (ref 22–32)
Calcium: 8.8 mg/dL — ABNORMAL LOW (ref 8.9–10.3)
Chloride: 106 mmol/L (ref 98–111)
Creatinine, Ser: 0.64 mg/dL (ref 0.44–1.00)
GFR calc Af Amer: >60 mL/min (ref >60)
GFR calc non Af Amer: >60 mL/min (ref >60)
Glucose, Bld: 93 mg/dL (ref 70–99)
Potassium: 3.8 mmol/L (ref 3.5–5.1)
Sodium: 133 mmol/L — ABNORMAL LOW (ref 135–145)
Total Bilirubin: 1 mg/dL (ref 0.3–1.2)
Total Protein: 7.5 g/dL (ref 6.5–8.1)

## 2018-09-01 LAB — CBG MONITORING, ED: Glucose-Capillary: 73 mg/dL (ref 70–99)

## 2018-09-01 LAB — PREGNANCY, URINE: Preg Test, Ur: NEGATIVE

## 2018-09-01 LAB — URINALYSIS, ROUTINE W REFLEX MICROSCOPIC
Bilirubin Urine: NEGATIVE
Glucose, UA: NEGATIVE mg/dL
Ketones, ur: NEGATIVE mg/dL
Leukocytes, UA: NEGATIVE
Nitrite: NEGATIVE
Protein, ur: NEGATIVE mg/dL
Specific Gravity, Urine: 1.025 (ref 1.005–1.030)
pH: 5.5 (ref 5.0–8.0)

## 2018-09-01 MED ORDER — SODIUM CHLORIDE 0.9% FLUSH
3.0000 mL | Freq: Two times a day (BID) | INTRAVENOUS | Status: DC
  Administered 2018-09-02 – 2018-09-05 (×7): 3 mL via INTRAVENOUS

## 2018-09-01 MED ORDER — SODIUM CHLORIDE 0.9 % IV SOLN
INTRAVENOUS | Status: AC
  Administered 2018-09-02: via INTRAVENOUS

## 2018-09-01 MED ORDER — ONDANSETRON HCL 4 MG PO TABS: 4.0000 mg | ORAL_TABLET | Freq: Four times a day (QID) | ORAL | Status: DC | PRN

## 2018-09-01 MED ORDER — SENNOSIDES-DOCUSATE SODIUM 8.6-50 MG PO TABS: 1.0000 | ORAL_TABLET | Freq: Every evening | ORAL | Status: DC | PRN

## 2018-09-01 MED ORDER — ACETAMINOPHEN 650 MG RE SUPP: 650.0000 mg | Freq: Four times a day (QID) | RECTAL | Status: DC | PRN

## 2018-09-01 MED ORDER — ONDANSETRON HCL 4 MG/2ML IJ SOLN: 4.0000 mg | Freq: Four times a day (QID) | INTRAMUSCULAR | Status: DC | PRN

## 2018-09-01 MED ORDER — ACETAMINOPHEN 325 MG PO TABS
650.0000 mg | ORAL_TABLET | Freq: Four times a day (QID) | ORAL | Status: DC | PRN
  Administered 2018-09-02 – 2018-09-03 (×2): 650 mg via ORAL
  Filled 2018-09-01 (×2): qty 2

## 2018-09-01 MED ORDER — SODIUM CHLORIDE 0.9 % IV BOLUS
1000.0000 mL | Freq: Once | INTRAVENOUS | Status: AC
  Administered 2018-09-01: 1000 mL via INTRAVENOUS

## 2018-09-01 NOTE — ED Notes (Signed)
Receiving RN in a patient room, will call back for report when available.
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
## 2018-09-01 NOTE — ED Notes (Signed)
Pt being assessed by teleneuro

## 2018-09-01 NOTE — Consult Note (Addendum)
[facility] Tele[facility]urology Consult Services  Impression:  R/O AIDP.  However, DTR's being present make this less likely. Possible acute neuropathy with at least Vit D deficiency which is known.  Recommendations:  --check head ct without contrast. --If head ct negative and normal coag profile, then Check LP with csf labs:  glucose, protein, cell counts with diff, gram stain, culture, MS Panel (including oligoclonal bands, IgG Index, Myelin Basic Protein), hsv 1 and 2 pcr, angiotensin converting enzyme --on serum check serum IgA level, serum protein electrophoresis, check b12 level, hga1c. --If serum IgA level is normal and if there is elevated protein in csf without pleocytosis, consider IVIG (0.4g/kg x 5 days).  If csf is negative, then proceed with mri brain, c/spine with and without gad, and potentially nerve conduction studies if available (including F waves). --check fvc/nif bid --vit d  50k units per week --admit to neuro unit with telemetry --see labs below over last month.  TSH and PTH pending. --inpatient neurology consult  Case discussed with ER physician. ---------------------------------------------------------------------  CC:  low back pain, bilateral feet tingling, numbness in hands, progressive ataxia, difficulty walking  History of Present Illness:  22 year old right-handed African-American female with a past medical history of an enlarged thyroid, vitamin D deficiency detected earlier this month as well as a history of cholecystectomy and appendectomy presents with 2 to 3 weeks of numbness and tingling in her hands and feet as well as low back pain during that time. Over the last two days, she complained of dizziness and vertigo. She tells me that she failed two days ago as well. She denies any change in our of our bladder. And there is no sensory level. She tells me that her legs are getting weaker.  Diagnostic Testing:   Today:  normal renal function, sodium 133,  ca 8.8, tsh and pth pending 08/17/18:  neg ana, esr 14, vit d 8.5, tsh 0.33 (normal T4 05/04/18), cu 76, zinc 76, thyroperoxidase antibody ab nl   05/04/18:  normal b12 level  Vital Signs:   af with normal bp, tachycardia initially  Exam:  Mental Status:  Awake, alert, oriented  Naming: Intact Repetition: Intact   Speech: fluent  Cranial Nerves:  Pupils: Equal round and reactive to light Extraocular movements: Intact in all cardinal gaze Ptosis: Absent Visual fields: Intact to finger counting Facial sensation: Intact to pin and light touch Facial movements: Intact and symmetric    Motor Exam:  No drift, but clearly more difficult for this patient to keep her legs off the bed bilaterally.   Tremor/Abnormal Movements:  Resting tremor: Absent Intention tremor: Absent Postural tremor: Absent  Sensory Exam:   symmetrically intact to sharp sensation.     Coordination:   Finger to nose: Intact Heel to shin: Intact  Per ER physician who I personally spoke to, she has normal deep tendon reflexes throughout  Medical Decision Making:  - Extensive number of diagnosis or management options are considered above.   - Extensive amount of complex data reviewed.   - High risk of complication and/or morbidity or mortality are associated with differential diagnostic considerations above.  - There may be uncertain outcome and increased probability of prolonged functional impairment or high probability of severe prolonged functional impairment associated with some of these differential diagnosis.   Medical Data Reviewed:  1.Data reviewed include clinical labs, radiology,  Medical Tests;   2.Tests results discussed w/performing or interpreting physician;   3.Obtaining/reviewing old medical records;  4.Obtaining case history from  another source;  5.Independent review of image, tracing or specimen.    Patient was informed the Neurology Consult would happen via telehealth (remote video) and  consented to receiving care in this manner.

## 2018-09-01 NOTE — ED Notes (Signed)
CBG measured due to pt stating last time she had these s/s.Nicole Evans.She states her blood sugar was low.
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
## 2018-09-01 NOTE — ED Notes (Signed)
Pt ambulatory to [facility]- c/o dizziness and numbness to right leg.
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
## 2018-09-01 NOTE — ED Triage Notes (Signed)
Pt reports feeling dizzy x Monday[facility] of 61, gave her an orange and sent home. Pt cont with feeling dizzy and low back pain. fsbs at triage 83.

## 2018-09-01 NOTE — ED Notes (Signed)
ED Provider at bedside. 

## 2018-09-01 NOTE — ED Notes (Signed)
Consent signed for LP, pt and pt mother decided against procedure. EDP at bedside discussing plan of care with patient.

## 2018-09-02 ENCOUNTER — Observation Stay (HOSPITAL_COMMUNITY): Payer: BLUE CROSS/BLUE SHIELD

## 2018-09-02 DIAGNOSIS — R202 Paresthesia of skin: Secondary | ICD-10-CM | POA: Diagnosis not present

## 2018-09-02 DIAGNOSIS — R200 Anesthesia of skin: Secondary | ICD-10-CM | POA: Diagnosis not present

## 2018-09-02 DIAGNOSIS — D72819 Decreased white blood cell count, unspecified: Secondary | ICD-10-CM | POA: Diagnosis not present

## 2018-09-02 DIAGNOSIS — E559 Vitamin D deficiency, unspecified: Secondary | ICD-10-CM | POA: Diagnosis not present

## 2018-09-02 DIAGNOSIS — E871 Hypo-osmolality and hyponatremia: Secondary | ICD-10-CM | POA: Diagnosis not present

## 2018-09-02 LAB — BASIC METABOLIC PANEL
ANION GAP: 7 (ref 5–15)
BUN: 12 mg/dL (ref 6–20)
CALCIUM: 8.5 mg/dL — AB (ref 8.9–10.3)
CO2: 24 mmol/L (ref 22–32)
Chloride: 107 mmol/L (ref 98–111)
Creatinine, Ser: 0.8 mg/dL (ref 0.44–1.00)
GFR calc Af Amer: >60 mL/min (ref >60)
GFR calc non Af Amer: >60 mL/min (ref >60)
Glucose, Bld: 126 mg/dL — ABNORMAL HIGH (ref 70–99)
Potassium: 3.2 mmol/L — ABNORMAL LOW (ref 3.5–5.1)
Sodium: 138 mmol/L (ref 135–145)

## 2018-09-02 LAB — CBC WITH DIFFERENTIAL/PLATELET
Abs Immature Granulocytes: 0.01 10*3/uL (ref 0.00–0.07)
Basophils Absolute: 0 10*3/uL (ref 0.0–0.1)
Basophils Relative: 1 %
Eosinophils Absolute: 0.1 10*3/uL (ref 0.0–0.5)
Eosinophils Relative: 3 %
HCT: 33.1 % — ABNORMAL LOW (ref 36.0–46.0)
Hemoglobin: 10.8 g/dL — ABNORMAL LOW (ref 12.0–15.0)
Immature Granulocytes: 0 %
Lymphocytes Relative: 49 %
Lymphs Abs: 2.3 10*3/uL (ref 0.7–4.0)
MCH: 29.2 pg (ref 26.0–34.0)
MCHC: 32.6 g/dL (ref 30.0–36.0)
MCV: 89.5 fL (ref 80.0–100.0)
Monocytes Absolute: 0.5 10*3/uL (ref 0.1–1.0)
Monocytes Relative: 10 %
NEUTROS PCT: 37 %
Neutro Abs: 1.8 10*3/uL (ref 1.7–7.7)
Platelets: 320 10*3/uL (ref 150–400)
RBC: 3.7 MIL/uL — ABNORMAL LOW (ref 3.87–5.11)
RDW: 13.5 % (ref 11.5–15.5)
WBC: 4.8 10*3/uL (ref 4.0–10.5)
nRBC: 0 % (ref 0.0–0.2)

## 2018-09-02 LAB — IGA: IgA: 180 mg/dL (ref 87–352)

## 2018-09-02 MED ORDER — POTASSIUM CHLORIDE CRYS ER 20 MEQ PO TBCR
40.0000 meq | EXTENDED_RELEASE_TABLET | ORAL | Status: AC
  Administered 2018-09-02 (×2): 40 meq via ORAL
  Filled 2018-09-02 (×2): qty 2

## 2018-09-02 MED ORDER — GADOBUTROL 1 MMOL/ML IV SOLN
5.0000 mL | Freq: Once | INTRAVENOUS | Status: AC | PRN
  Administered 2018-09-02: 5 mL via INTRAVENOUS

## 2018-09-02 MED ORDER — LORAZEPAM 2 MG/ML IJ SOLN
0.5000 mg | Freq: Once | INTRAMUSCULAR | Status: AC
  Administered 2018-09-02: 0.5 mg via INTRAVENOUS
  Filled 2018-09-02: qty 1

## 2018-09-02 MED ORDER — GADOBUTROL 1 MMOL/ML IV SOLN
4.0000 mL | Freq: Once | INTRAVENOUS | Status: AC | PRN
  Administered 2018-09-02: 4 mL via INTRAVENOUS

## 2018-09-02 NOTE — Progress Notes (Signed)
PROGRESS NOTE                                                                                                                                                                                                             Patient Demographics:    Nicole Evans, is a 22 y.o. female, DOB - 1996-08-30, ZOX:[id number]  Admit date - 09/01/2018   Admitting Physician Briscoe Deutscher, MD  Outpatient Primary MD for the patient is Deeann Saint, MD  LOS - 0   Chief Complaint  Patient presents with  . Dizziness       Brief Narrative    22 y.o. female who denies any significant past medical history, now presenting to the emergency department for evaluation of numbness and tingling in all extremities, as well as generalized weakness and fatigue.  He was seen by neurology service, who recommended admission for further work-up   Subjective:    Nicole Evans today has, No headache, No chest pain, No abdominal pain - No Nausea, ports no changing in the tingling and numbness  Assessment  & Plan :    Principal Problem:   Numbness and tingling of upper and lower extremities of both sides Active Problems:   Leukopenia   Hyponatremia   Vitamin D deficiency  Numbness and tingling of extremities  - Presents with 2 wks of numbness and tingling in all extremities, neurology consult greatly appreciated, MRI brain, C-spine, and lumbar spines were obtained, no acute findings, MRI thoracic spine still pending. -Awaiting further recommendation from neurology  Hyponatremia  - Serum potassium is 133 on admission, appears to be hypovolemic , corrected with IV fluids  Leukopenia  - WBC is 3,200 on admission, previously normal  - No fever or infectious sxs, autoimmune serologies recently negative  - Culture if febrile, repeat CBC in am  -HIV pending  Vit D deficiency  - Recently diagnosed by PCP and started on supplementation      Code Status : Full  Family  Communication  : Parents at bedside  Disposition Plan  : Home when stable   Consults  :  Neurology  Procedures  : none  DVT Prophylaxis  :  SCD  Lab Results  Component Value Date   PLT 320 09/02/2018    Antibiotics  :    Anti-infectives (From admission, onward)  None        Objective:   Vitals:   09/01/18 2008 09/01/18 2131 09/02/18 0529 09/02/18 1335  BP: 115/66 116/67 (!) 115/59 (!) 109/58  Pulse: 79 70  88  Resp: 15   (!) 22  Temp:  99 F (37.2 C) 98.3 F (36.8 C) 98.7 F (37.1 C)  TempSrc:  Oral Oral   SpO2: 100% 98% 100% 100%  Weight:      Height:        Wt Readings from Last 3 Encounters:  09/01/18 56.2 kg  05/04/18 55.8 kg  12/27/17 60.1 kg     Intake/Output Summary (Last 24 hours) at 09/02/2018 1636 Last data filed at 09/02/2018 1300 Gross per 24 hour  Intake 703.6 ml  Output 350 ml  Net 353.6 ml     Physical Exam  Awake Alert, Oriented X 3, No new F.N deficits, Normal affect Symmetrical Chest wall movement, Good air movement bilaterally, CTAB RRR,No Gallops,Rubs or new Murmurs, No Parasternal Heave +ve B.Sounds, Abd Soft, No tenderness,  No rebound - guarding or rigidity. No Cyanosis, Clubbing or edema, No new Rash or bruise      Data Review:    CBC Recent Labs  Lab 09/01/18 1141 09/02/18 0405  WBC 3.2* 4.8  HGB 11.3* 10.8*  HCT 35.7* 33.1*  PLT 325 320  MCV 90.4 89.5  MCH 28.6 29.2  MCHC 31.7 32.6  RDW 13.7 13.5  LYMPHSABS 1.3 2.3  MONOABS 0.3 0.5  EOSABS 0.1 0.1  BASOSABS 0.0 0.0    Chemistries  Recent Labs  Lab 09/01/18 1141 09/02/18 0405  NA 133* 138  K 3.8 3.2*  CL 106 107  CO2 22 24  GLUCOSE 93 126*  BUN 21* 12  CREATININE 0.64 0.80  CALCIUM 8.8* 8.5*  AST 15  --   ALT 21  --   ALKPHOS 72  --   BILITOT 1.0  --    ------------------------------------------------------------------------------------------------------------------ No results for input(s): CHOL, HDL, LDLCALC, TRIG, CHOLHDL, LDLDIRECT in  the last 72 hours.  Lab Results  Component Value Date   HGBA1C 5.5 05/04/2018   ------------------------------------------------------------------------------------------------------------------ No results for input(s): TSH, T4TOTAL, T3FREE, THYROIDAB in the last 72 hours.  Invalid input(s): FREET3 ------------------------------------------------------------------------------------------------------------------ No results for input(s): VITAMINB12, FOLATE, FERRITIN, TIBC, IRON, RETICCTPCT in the last 72 hours.  Coagulation profile No results for input(s): INR, PROTIME in the last 168 hours.  No results for input(s): DDIMER in the last 72 hours.  Cardiac Enzymes No results for input(s): CKMB, TROPONINI, MYOGLOBIN in the last 168 hours.  Invalid input(s): CK ------------------------------------------------------------------------------------------------------------------ No results found for: BNP  Inpatient Medications  Scheduled Meds: . sodium chloride flush  3 mL Intravenous Q12H   Continuous Infusions: PRN Meds:.acetaminophen **OR** acetaminophen, ondansetron **OR** ondansetron (ZOFRAN) IV, senna-docusate  Micro Results No results found for this or any previous visit (from the past 240 hour(s)).  Radiology Reports Dg Lumbar Spine Complete  Result Date: 09/01/2018 CLINICAL DATA:  Low back pain for the past 2 weeks. Bilateral lower extremity radicular symptoms. EXAM: LUMBAR SPINE - COMPLETE 4+ VIEW COMPARISON:  None in PACs FINDINGS: The L1 transverse processes are assumed to be transitional. The lumbar vertebral bodies are preserved in height. The disc space heights are well maintained. There is no spondylolisthesis. The pedicles and transverse processes are intact. The IUD is in stable position. IMPRESSION: There is no acute or significant chronic bony abnormality of the lumbar spine. Electronically Signed   By: David  SwazilandJordan M.D.  On: 09/01/2018 12:18   Ct Head Wo  Contrast  Result Date: 09/01/2018 CLINICAL DATA:  Ataxia headache numbness in the lower extremity EXAM: CT HEAD WITHOUT CONTRAST TECHNIQUE: Contiguous axial images were obtained from the base of the skull through the vertex without intravenous contrast. COMPARISON:  None. FINDINGS: Brain: No evidence of acute infarction, hemorrhage, hydrocephalus, extra-axial collection or mass lesion/mass effect. Vascular: No hyperdense vessel or unexpected calcification. Skull: Normal. Negative for fracture or focal lesion. Sinuses/Orbits: No acute finding. Other: None IMPRESSION: Negative non contrasted CT appearance of the brain Electronically Signed   By: Jasmine PangKim  Fujinaga M.D.   On: 09/01/2018 18:08   Mr Laqueta JeanBrain W YQWo Contrast  Result Date: 09/02/2018 CLINICAL DATA:  Numbness and tingling, paresthesias hands and legs. EXAM: MRI HEAD WITHOUT AND WITH CONTRAST TECHNIQUE: Multiplanar, multiecho pulse sequences of the brain and surrounding structures were obtained without and with intravenous contrast. CONTRAST:  5 mL Gadovist IV COMPARISON:  CT head 09/01/2018 FINDINGS: Brain: No acute infarction, hemorrhage, hydrocephalus, extra-axial collection or mass lesion. Negative for demyelinating disease. Normal enhancement postcontrast administration. Vascular: Normal arterial flow void Skull and upper cervical spine: Negative Sinuses/Orbits: Mild mucosal edema paranasal sinuses.  Normal orbit Other: None IMPRESSION: Normal MRI head with contrast. Electronically Signed   By: Marlan Palauharles  Clark M.D.   On: 09/02/2018 08:24   Mr Cervical Spine W Wo Contrast  Result Date: 09/02/2018 CLINICAL DATA:  Numbness and tingling hands and legs.  Paresthesias. EXAM: MRI CERVICAL SPINE WITHOUT AND WITH CONTRAST TECHNIQUE: Multiplanar and multiecho pulse sequences of the cervical spine, to include the craniocervical junction and cervicothoracic junction, were obtained without and with intravenous contrast. CONTRAST:  5 mL Gadovist IV COMPARISON:   None. FINDINGS: Alignment: Normal Vertebrae: Negative for fracture or mass. Small hemangioma C7 vertebral body Cord: Normal signal and morphology Posterior Fossa, vertebral arteries, paraspinal tissues: Negative Disc levels: Negative for disc protrusion or spinal stenosis. 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 Normal enhancement postcontrast administration. IMPRESSION: Normal appearing cervical spinal cord. Negative for significant degenerative change or neural impingement in the cervical spine. Electronically Signed   By: Marlan Palauharles  Clark M.D.   On: 09/02/2018 07:51   Mr Lumbar Spine W Wo Contrast  Result Date: 09/02/2018 CLINICAL DATA:  Two week history of tingling and numbness hands and legs. Paresthesia. Back pain EXAM: MRI LUMBAR SPINE WITHOUT AND WITH CONTRAST TECHNIQUE: Multiplanar and multiecho pulse sequences of the lumbar spine were obtained without and with intravenous contrast. CONTRAST:  5 mL Gadovist IV COMPARISON:  Lumbar radiographs 09/01/2018 FINDINGS: Segmentation:  Normal Alignment:  Normal Vertebrae:  Normal bone marrow.  Negative for fracture or mass. Conus medullaris and cauda equina: Conus extends to the L2 level. Conus and cauda equina appear normal. Paraspinal and other soft tissues: Negative for paraspinous mass or fluid collection. Large amount of stool in the rectum. Disc levels: Normal disc spaces.  Negative for disc protrusion or stenosis. IMPRESSION: Normal MRI lumbar spine with contrast Large amount of stool in the rectum. Electronically Signed   By: Marlan Palauharles  Clark M.D.   On: 09/02/2018 08:27     Huey Bienenstockawood Caedence Snowden M.D on 09/02/2018 at 4:36 PM  Between 7am to 7pm - Pager - [phone]  After 7pm go to [email or web address] - password [facility]  [facility] -  Office  [phone]

## 2018-09-02 NOTE — Progress Notes (Addendum)
Neurology Progress Note   S:// Seen and examined. Completed MRI of the whole neuraxis without any significant abnormality to explain symptoms.   O:// Current vital signs: BP (!) 109/58 (BP Location: Left Arm)   Pulse 88   Temp 98.7 F (37.1 C)   Resp (!) 22   Ht 5\' 5"  (1.651 m)   Wt 56.2 kg   LMP 07/26/2018   SpO2 100%   BMI 20.63 kg/m  Vital signs in last 24 hours: Temp:  [98.3 F (36.8 C)-99 F (37.2 C)] 98.7 F (37.1 C) (12/21 1335) Pulse Rate:  [70-88] 88 (12/21 1335) Resp:  [15-22] 22 (12/21 1335) BP: (109-116)/(58-67) 109/58 (12/21 1335) SpO2:  [98 %-100 %] 100 % (12/21 1335) Essentially unchanged neurological exam from yesterday Neurological exam Patient awake alert oriented x3. Her speech is clear. Naming intact Comprehension intact Fluency intact Repetition intact Cranials: Pupils equal round reactive light, extraocular movements intact, visual fields are full, facial sensation intact, face is symmetric, tongue is midline, palate elevates symmetrically. Motor exam: 5/5 in both upper extremities with a fine tremor in the right upper extremity when outstretched.  Normal tone in the upper extremities.  Moderately increased tone in both lower extremities.  She is able to go antigravity and hold legs without drift but is unable to flex her knees or flex at the hip without significant discomfort. Sensory exam: Intact light touch all over without extinction. To pinprick, there is some decreased sensation in the right leg up to knee. To vibration, some decrease on right leg till knee. Also decreased vibration on collar bone on right. Very unusual pattern. Also temperature sensation is intact all over. Coordination: Intact finger-nose-finger. Gait testing deferred at this time Deep tendon reflexes: 2+ at the biceps bilaterally, 2+ at the triceps bilaterally, 2+ brachioradialis bilaterally, 2+ to 3+ bilateral knees, 2+ ankle jerks bilaterally, toes mute bilaterally.  No  Hoffman's.  No clonus.  Medications  Current Facility-Administered Medications:  .  acetaminophen (TYLENOL) tablet 650 mg, 650 mg, Oral, Q6H PRN, 650 mg at 09/02/18 1652 **OR** acetaminophen (TYLENOL) suppository 650 mg, 650 mg, Rectal, Q6H PRN, Opyd, Timothy S, MD .  ondansetron (ZOFRAN) tablet 4 mg, 4 mg, Oral, Q6H PRN **OR** ondansetron (ZOFRAN) injection 4 mg, 4 mg, Intravenous, Q6H PRN, Opyd, Timothy S, MD .  senna-docusate (Senokot-S) tablet 1 tablet, 1 tablet, Oral, QHS PRN, Opyd, Timothy S, MD .  sodium chloride flush (NS) 0.9 % injection 3 mL, 3 mL, Intravenous, Q12H, Opyd, Lavone Neriimothy S, MD, 3 mL at 09/02/18 1043 Labs CBC    Component Value Date/Time   WBC 4.8 09/02/2018 0405   RBC 3.70 (L) 09/02/2018 0405   HGB 10.8 (L) 09/02/2018 0405   HGB 12.1 08/17/2018 1359   HCT 33.1 (L) 09/02/2018 0405   HCT 37.2 08/17/2018 1359   PLT 320 09/02/2018 0405   PLT 362 08/17/2018 1359   MCV 89.5 09/02/2018 0405   MCV 89 08/17/2018 1359   MCH 29.2 09/02/2018 0405   MCHC 32.6 09/02/2018 0405   RDW 13.5 09/02/2018 0405   RDW 12.7 08/17/2018 1359   LYMPHSABS 2.3 09/02/2018 0405   LYMPHSABS 2.1 08/17/2018 1359   MONOABS 0.5 09/02/2018 0405   EOSABS 0.1 09/02/2018 0405   EOSABS 0.1 08/17/2018 1359   BASOSABS 0.0 09/02/2018 0405   BASOSABS 0.0 08/17/2018 1359    CMP     Component Value Date/Time   NA 138 09/02/2018 0405   NA 139 08/17/2018 1359   K 3.2 (L)  09/02/2018 0405   CL 107 09/02/2018 0405   CO2 24 09/02/2018 0405   GLUCOSE 126 (H) 09/02/2018 0405   BUN 12 09/02/2018 0405   BUN 17 08/17/2018 1359   CREATININE 0.80 09/02/2018 0405   CALCIUM 8.5 (L) 09/02/2018 0405   PROT 7.5 09/01/2018 1141   PROT 7.1 08/17/2018 1359   ALBUMIN 4.2 09/01/2018 1141   ALBUMIN 4.4 08/17/2018 1359   AST 15 09/01/2018 1141   ALT 21 09/01/2018 1141   ALKPHOS 72 09/01/2018 1141   BILITOT 1.0 09/01/2018 1141   BILITOT 0.7 08/17/2018 1359   GFRNONAA >60 09/02/2018 0405   GFRAA >60 09/02/2018  0405   Imaging I have reviewed images in epic and the results pertinent to this consultation are: MRI examination of the brain-WNL MRI C-, T-, L-spine with no abnormalities or enhancement to suggest TM or root enhancement to suggest GBS.  Assessment: 22/F with no significant PMH with 2 weeks worth of difficulty walking and tingling and numbness in fingers and predominantly in lower extremities. Exam shows increased tone in the LE b/l, preserved reflexes with mute toes, and a inconsistent sensory exam. In the absence of structural explanation for symptoms (MRI neuraxis normal), next step would be to proceed and do a spinal tap. She and her mother have refused bedside LP and request it under fluoro guidance. I will request IR guided LP for the AM. Could be atypical presentation of AIDP, as reflexes are preserved.  Impression: Progressive gait difficulty and lower extremity stiffness tingling and numbness Upper extremity subjective paresthesias No evidence of TM or MS on imaging Atypical GBS?  Recommendations: No further imaging for now. Already has had ANA and RF tested - normal. LP under fluoro in the AM. Pt and family refused consent for bedside procedure. Ordered CSF studies to include: Glucose, protein, O-bands, IgG index, MBP, cell count and gram stain. Will follow.  -- Milon DikesAshish Greogry Goodwyn, MD [facility] Pager: [phone] If 7pm to 7am, please call on call as listed on AMION.

## 2018-09-03 DIAGNOSIS — D72819 Decreased white blood cell count, unspecified: Secondary | ICD-10-CM | POA: Diagnosis not present

## 2018-09-03 DIAGNOSIS — E559 Vitamin D deficiency, unspecified: Secondary | ICD-10-CM | POA: Diagnosis not present

## 2018-09-03 DIAGNOSIS — R202 Paresthesia of skin: Secondary | ICD-10-CM | POA: Diagnosis not present

## 2018-09-03 DIAGNOSIS — R200 Anesthesia of skin: Secondary | ICD-10-CM | POA: Diagnosis not present

## 2018-09-03 DIAGNOSIS — E871 Hypo-osmolality and hyponatremia: Secondary | ICD-10-CM | POA: Diagnosis not present

## 2018-09-03 LAB — CBC
HCT: 36.3 % (ref 36.0–46.0)
Hemoglobin: 11.4 g/dL — ABNORMAL LOW (ref 12.0–15.0)
MCH: 28 pg (ref 26.0–34.0)
MCHC: 31.4 g/dL (ref 30.0–36.0)
MCV: 89.2 fL (ref 80.0–100.0)
PLATELETS: 311 10*3/uL (ref 150–400)
RBC: 4.07 MIL/uL (ref 3.87–5.11)
RDW: 13.5 % (ref 11.5–15.5)
WBC: 5.2 10*3/uL (ref 4.0–10.5)
nRBC: 0 % (ref 0.0–0.2)

## 2018-09-03 LAB — BASIC METABOLIC PANEL
Anion gap: 8 (ref 5–15)
BUN: 13 mg/dL (ref 6–20)
CO2: 21 mmol/L — ABNORMAL LOW (ref 22–32)
Calcium: 9.1 mg/dL (ref 8.9–10.3)
Chloride: 109 mmol/L (ref 98–111)
Creatinine, Ser: 0.83 mg/dL (ref 0.44–1.00)
GFR calc Af Amer: >60 mL/min (ref >60)
GFR calc non Af Amer: >60 mL/min (ref >60)
Glucose, Bld: 95 mg/dL (ref 70–99)
POTASSIUM: 4 mmol/L (ref 3.5–5.1)
Sodium: 138 mmol/L (ref 135–145)

## 2018-09-03 LAB — RAPID URINE DRUG SCREEN, HOSP PERFORMED
Amphetamines: NOT DETECTED
Barbiturates: NOT DETECTED
Benzodiazepines: NOT DETECTED
COCAINE: NOT DETECTED
Opiates: NOT DETECTED
Tetrahydrocannabinol: NOT DETECTED

## 2018-09-03 LAB — C-REACTIVE PROTEIN: CRP: <0.8 mg/dL (ref <1.0)

## 2018-09-03 LAB — PTH, INTACT AND CALCIUM
Calcium, Total (PTH): 9.4 mg/dL (ref 8.7–10.2)
PTH: 30 pg/mL (ref 15–65)

## 2018-09-03 LAB — VITAMIN B12: Vitamin B-12: 476 pg/mL (ref 180–914)

## 2018-09-03 LAB — TSH: TSH: 0.483 u[IU]/mL (ref 0.350–4.500)

## 2018-09-03 LAB — SEDIMENTATION RATE: Sed Rate: 5 mm/hr (ref 0–22)

## 2018-09-03 MED ORDER — HYDROCODONE-ACETAMINOPHEN 5-325 MG PO TABS
1.0000 | ORAL_TABLET | Freq: Four times a day (QID) | ORAL | Status: DC | PRN
  Administered 2018-09-03 – 2018-09-05 (×5): 1 via ORAL
  Filled 2018-09-03 (×5): qty 1

## 2018-09-03 NOTE — Progress Notes (Signed)
Reason for consult: Progressive  leg weakness  Subjective: No change from yesterday, Does not feel any weaker. Continues to have paraesthesias over hands and legs.    ROS: negative except above  Examination  Vital signs in last 24 hours: Temp:  [98 F (36.7 C)-98.5 F (36.9 C)] 98.5 F (36.9 C) (12/22 1532) Pulse Rate:  [78-82] 79 (12/22 1532) Resp:  [16-17] 16 (12/22 1532) BP: (100-107)/(54-70) 107/60 (12/22 1532) SpO2:  [100 %] 100 % (12/22 1532)  General: lying in bed CVS: pulse-normal rate and rhythm RS: breathing comfortably Extremities: normal   Neuro: MS: Alert, oriented, follows commands CN: pupils equal and reactive,  EOMI, face symmetric, tongue midline, normal sensation over face, Motor: 5/5 strength in bilateral upper extremities, 4/5 strength at hip flexors,  Tone: mildly increased tone in both legs, appears to decrease on distractive manouvres Reflexes: 2+ bilaterally over patella, biceps, plantars: flexor Coordination: normal Gait: not tested  Basic Metabolic Panel: Recent Labs  Lab 09/01/18 1141 09/02/18 0405 09/03/18 0340  NA 133* 138 138  K 3.8 3.2* 4.0  CL 106 107 109  CO2 22 24 21*  GLUCOSE 93 126* 95  BUN 21* 12 13  CREATININE 0.64 0.80 0.83  CALCIUM 8.8*  9.4 8.5* 9.1    CBC: Recent Labs  Lab 09/01/18 1141 09/02/18 0405 09/03/18 0340  WBC 3.2* 4.8 5.2  NEUTROABS 1.4* 1.8  --   HGB 11.3* 10.8* 11.4*  HCT 35.7* 33.1* 36.3  MCV 90.4 89.5 89.2  PLT 325 320 311     Coagulation Studies: No results for input(s): LABPROT, INR in the last 72 hours.  Imaging Reviewed:     ASSESSMENT AND PLAN  22 year old female with past medical history of migraine, chronic pain over her whole body, thyromegaly and hyperthyroidism presents with 2 weeks of progressive weakness and numbness over hands and feet.    Peripheral neuropathy labs so far negative, TSH repeated today is normal Reviewed MRI brian, MRI C, T and L spine showed no pathology  to explain patients symptoms.   Impression:   Progressive Bilateral leg weakness of unclear etiology  B/L Hand and leg paresthesias H/o of chronic pain  Recommendations LP under fluoroscopy  PT/OT evaluation Will start trial of gabapentin if symptoms improve    Christphor Groft [facility] Pager Number [phone] For questions after 7pm please refer to AMION to reach the Neurologist on call

## 2018-09-03 NOTE — Progress Notes (Signed)
PROGRESS NOTE                                                                                                                                                                                                             Patient Demographics:    Nicole Evans, is a 22 y.o. female, DOB - Jan 30, 1996, ZOX:[id number]  Admit date - 09/01/2018   Admitting Physician Briscoe Deutscher, MD  Outpatient Primary MD for the patient is Deeann Saint, MD  LOS - 0   Chief Complaint  Patient presents with  . Dizziness       Brief Narrative    22 y.o. female who denies any significant past medical history, now presenting to the emergency department for evaluation of numbness and tingling in all extremities, as well as generalized weakness and fatigue.  He was seen by neurology service, who recommended admission for further work-up   Subjective:    Nicole Evans was seen and examined today.  Reported no progression of her disease.  Still having numbness tingling her hands and feet below the knee area.  Is any headaches visual changes asymmetric weaknesses.  Has been having any double vision.   Assessment  & Plan :    Principal Problem:   Numbness and tingling of upper and lower extremities of both sides Active Problems:   Leukopenia   Hyponatremia   Vitamin D deficiency  Numbness and tingling of extremities  -Still complaining of bilateral hand and feet numbness, lower extremity weakness is below the knee with tingling and subjective paresthesia. - Presents with 2 wks of numbness and tingling in all extremities,  -neurology consult greatly appreciated, MRI brain, C-spine, and lumbar spines were obtained, no acute findings, MRI thoracic spine is completed within normal limits -No further imaging was commended, -ANA and RF tested - normal. -Neurology has recommended LP under fluoroscopy in a.m., CSF studies to be followed  Hyponatremia  -Stable - Serum  potassium is 133 on admission, appears to be hypovolemic , corrected with IV fluids  Leukopenia  - WBC is 3,200 on admission, previously normal  - No fever or infectious sxs, autoimmune serologies recently negative  - Culture if febrile, repeat CBC >>> WBC 5.2, normal -HIV pending  Vit D deficiency  - Recently diagnosed by PCP and started on supplementation  Code Status : Full  Family Communication  : Parents at bedside  Disposition Plan  : Home when stable   Consults  :  Neurology  Procedures  : none  DVT Prophylaxis  :  SCD  Lab Results  Component Value Date   PLT 311 09/03/2018    Antibiotics  :    Anti-infectives (From admission, onward)   None        Objective:   Vitals:   09/02/18 0529 09/02/18 1335 09/02/18 2154 09/03/18 0423  BP: (!) 115/59 (!) 109/58 104/70 (!) 100/54  Pulse:  88 78 82  Resp:  (!) 22 16 17   Temp: 98.3 F (36.8 C) 98.7 F (37.1 C) 98 F (36.7 C) 98.1 F (36.7 C)  TempSrc: Oral  Oral Oral  SpO2: 100% 100% 100% 100%  Weight:      Height:        Wt Readings from Last 3 Encounters:  09/01/18 56.2 kg  05/04/18 55.8 kg  12/27/17 60.1 kg     Intake/Output Summary (Last 24 hours) at 09/03/2018 1228 Last data filed at 09/03/2018 0930 Gross per 24 hour  Intake 360 ml  Output -  Net 360 ml     Physical Exam  Awake Alert, Oriented X 3, No new F.N deficits, Normal affect Symmetrical Chest wall movement, Good air movement bilaterally, CTAB RRR,No Gallops,Rubs or new Murmurs, No Parasternal Heave +ve B.Sounds, Abd Soft, No tenderness,  No rebound - guarding or rigidity. No Cyanosis, Clubbing or edema, No new Rash or bruise      Data Review:    CBC Recent Labs  Lab 09/01/18 1141 09/02/18 0405 09/03/18 0340  WBC 3.2* 4.8 5.2  HGB 11.3* 10.8* 11.4*  HCT 35.7* 33.1* 36.3  PLT 325 320 311  MCV 90.4 89.5 89.2  MCH 28.6 29.2 28.0  MCHC 31.7 32.6 31.4  RDW 13.7 13.5 13.5  LYMPHSABS 1.3 2.3  --   MONOABS 0.3 0.5   --   EOSABS 0.1 0.1  --   BASOSABS 0.0 0.0  --     Chemistries  Recent Labs  Lab 09/01/18 1141 09/02/18 0405 09/03/18 0340  NA 133* 138 138  K 3.8 3.2* 4.0  CL 106 107 109  CO2 22 24 21*  GLUCOSE 93 126* 95  BUN 21* 12 13  CREATININE 0.64 0.80 0.83  CALCIUM 8.8*  9.4 8.5* 9.1  AST 15  --   --   ALT 21  --   --   ALKPHOS 72  --   --   BILITOT 1.0  --   --    ------------------------------------------------------------------------------------------------------------------ No results for input(s): CHOL, HDL, LDLCALC, TRIG, CHOLHDL, LDLDIRECT in the last 72 hours.  Lab Results  Component Value Date   HGBA1C 5.5 05/04/2018   ------------------------------------------------------------------------------------------------------------------ Recent Labs    09/03/18 1133  TSH 0.483   ------------------------------------------------------------------------------------------------------------------- No results found for: BNP  Inpatient Medications  Scheduled Meds: . sodium chloride flush  3 mL Intravenous Q12H   Continuous Infusions: PRN Meds:.acetaminophen **OR** acetaminophen, ondansetron **OR** ondansetron (ZOFRAN) IV, senna-docusate  Micro Results No results found for this or any previous visit (from the past 240 hour(s)).  Radiology Reports Dg Lumbar Spine Complete  Result Date: 09/01/2018 CLINICAL DATA:  Low back pain for the past 2 weeks. Bilateral lower extremity radicular symptoms. EXAM: LUMBAR SPINE - COMPLETE 4+ VIEW COMPARISON:  None in PACs FINDINGS: The L1 transverse processes are assumed to be transitional. The lumbar vertebral bodies are preserved in  height. The disc space heights are well maintained. There is no spondylolisthesis. The pedicles and transverse processes are intact. The IUD is in stable position. IMPRESSION: There is no acute or significant chronic bony abnormality of the lumbar spine. Electronically Signed   By: David  SwazilandJordan M.D.   On:  09/01/2018 12:18   Ct Head Wo Contrast  Result Date: 09/01/2018 CLINICAL DATA:  Ataxia headache numbness in the lower extremity EXAM: CT HEAD WITHOUT CONTRAST TECHNIQUE: Contiguous axial images were obtained from the base of the skull through the vertex without intravenous contrast. COMPARISON:  None. FINDINGS: Brain: No evidence of acute infarction, hemorrhage, hydrocephalus, extra-axial collection or mass lesion/mass effect. Vascular: No hyperdense vessel or unexpected calcification. Skull: Normal. Negative for fracture or focal lesion. Sinuses/Orbits: No acute finding. Other: None IMPRESSION: Negative non contrasted CT appearance of the brain Electronically Signed   By: Jasmine PangKim  Fujinaga M.D.   On: 09/01/2018 18:08   Mr Laqueta JeanBrain W ZOWo Contrast  Result Date: 09/02/2018 CLINICAL DATA:  Numbness and tingling, paresthesias hands and legs. EXAM: MRI HEAD WITHOUT AND WITH CONTRAST TECHNIQUE: Multiplanar, multiecho pulse sequences of the brain and surrounding structures were obtained without and with intravenous contrast. CONTRAST:  5 mL Gadovist IV COMPARISON:  CT head 09/01/2018 FINDINGS: Brain: No acute infarction, hemorrhage, hydrocephalus, extra-axial collection or mass lesion. Negative for demyelinating disease. Normal enhancement postcontrast administration. Vascular: Normal arterial flow void Skull and upper cervical spine: Negative Sinuses/Orbits: Mild mucosal edema paranasal sinuses.  Normal orbit Other: None IMPRESSION: Normal MRI head with contrast. Electronically Signed   By: Marlan Palauharles  Clark M.D.   On: 09/02/2018 08:24   Mr Cervical Spine W Wo Contrast  Result Date: 09/02/2018 CLINICAL DATA:  Numbness and tingling hands and legs.  Paresthesias. EXAM: MRI CERVICAL SPINE WITHOUT AND WITH CONTRAST TECHNIQUE: Multiplanar and multiecho pulse sequences of the cervical spine, to include the craniocervical junction and cervicothoracic junction, were obtained without and with intravenous contrast. CONTRAST:  5  mL Gadovist IV COMPARISON:  None. FINDINGS: Alignment: Normal Vertebrae: Negative for fracture or mass. Small hemangioma C7 vertebral body Cord: Normal signal and morphology Posterior Fossa, vertebral arteries, paraspinal tissues: Negative Disc levels: Negative for disc protrusion or spinal stenosis. 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 Normal enhancement postcontrast administration. IMPRESSION: Normal appearing cervical spinal cord. Negative for significant degenerative change or neural impingement in the cervical spine. Electronically Signed   By: Marlan Palauharles  Clark M.D.   On: 09/02/2018 07:51   Mr Thoracic Spine W Wo Contrast  Result Date: 09/02/2018 CLINICAL DATA:  Acute/progressive myelopathy. Numbness and tingling of the hands and legs. EXAM: MRI THORACIC WITHOUT AND WITH CONTRAST TECHNIQUE: Multiplanar and multiecho pulse sequences of the thoracic spine were obtained without and with intravenous contrast. CONTRAST:  4 cc Gadavist COMPARISON:  Cervical and lumbar examinations earlier today. FINDINGS: MRI THORACIC SPINE FINDINGS Alignment:  Normal Vertebrae: Normal Cord:  Normal.  No focal cord lesion.  No abnormal enhancement. Paraspinal and other soft tissues: Normal Disc levels: All disc levels appear normal. No degeneration, bulge or herniation. No facet arthropathy. No canal or foraminal stenosis. IMPRESSION: Normal thoracic examination. No abnormality seen to explain the presenting symptoms. Electronically Signed   By: Paulina FusiMark  Shogry M.D.   On: 09/02/2018 18:59   Mr Lumbar Spine W Wo Contrast  Result Date: 09/02/2018 CLINICAL DATA:  Two week history of tingling and numbness hands and legs. Paresthesia. Back pain EXAM:  MRI LUMBAR SPINE WITHOUT AND WITH CONTRAST TECHNIQUE: Multiplanar and multiecho pulse sequences of the lumbar spine were obtained without and with intravenous contrast. CONTRAST:  5 mL Gadovist IV  COMPARISON:  Lumbar radiographs 09/01/2018 FINDINGS: Segmentation:  Normal Alignment:  Normal Vertebrae:  Normal bone marrow.  Negative for fracture or mass. Conus medullaris and cauda equina: Conus extends to the L2 level. Conus and cauda equina appear normal. Paraspinal and other soft tissues: Negative for paraspinous mass or fluid collection. Large amount of stool in the rectum. Disc levels: Normal disc spaces.  Negative for disc protrusion or stenosis. IMPRESSION: Normal MRI lumbar spine with contrast Large amount of stool in the rectum. Electronically Signed   By: Marlan Palauharles  Clark M.D.   On: 09/02/2018 08:27     Kendell BaneSeyed A Anjulie Dipierro M.D on 09/03/2018 at 12:28 PM  Between 7am to 7pm - Pager - [phone]  After 7pm go to [email or web address] - password [email or web address]  [facility] -  Office  [phone]

## 2018-09-03 NOTE — Progress Notes (Signed)
Patient scheduled for Lumbar Puncture. Radiology informed had to be pushed till 12/23. Patient does not need to be NPO just hold anticoagulants. Orders for CSF fluid labs placed but not acknowledge due to schedule push.

## 2018-09-04 DIAGNOSIS — Z880 Allergy status to penicillin: Secondary | ICD-10-CM | POA: Diagnosis not present

## 2018-09-04 DIAGNOSIS — R200 Anesthesia of skin: Secondary | ICD-10-CM | POA: Diagnosis not present

## 2018-09-04 DIAGNOSIS — D72819 Decreased white blood cell count, unspecified: Secondary | ICD-10-CM | POA: Diagnosis present

## 2018-09-04 DIAGNOSIS — I959 Hypotension, unspecified: Secondary | ICD-10-CM | POA: Diagnosis present

## 2018-09-04 DIAGNOSIS — R42 Dizziness and giddiness: Secondary | ICD-10-CM | POA: Diagnosis present

## 2018-09-04 DIAGNOSIS — E559 Vitamin D deficiency, unspecified: Secondary | ICD-10-CM | POA: Diagnosis present

## 2018-09-04 DIAGNOSIS — E871 Hypo-osmolality and hyponatremia: Secondary | ICD-10-CM | POA: Diagnosis present

## 2018-09-04 DIAGNOSIS — M436 Torticollis: Secondary | ICD-10-CM | POA: Diagnosis not present

## 2018-09-04 DIAGNOSIS — G8929 Other chronic pain: Secondary | ICD-10-CM | POA: Diagnosis present

## 2018-09-04 DIAGNOSIS — R202 Paresthesia of skin: Secondary | ICD-10-CM

## 2018-09-04 DIAGNOSIS — M79609 Pain in unspecified limb: Secondary | ICD-10-CM | POA: Diagnosis present

## 2018-09-04 DIAGNOSIS — Z62810 Personal history of physical and sexual abuse in childhood: Secondary | ICD-10-CM | POA: Diagnosis present

## 2018-09-04 DIAGNOSIS — R944 Abnormal results of kidney function studies: Secondary | ICD-10-CM | POA: Diagnosis present

## 2018-09-04 DIAGNOSIS — R51 Headache: Secondary | ICD-10-CM | POA: Diagnosis not present

## 2018-09-04 LAB — HIV ANTIBODY (ROUTINE TESTING W REFLEX): HIV Screen 4th Generation wRfx: NONREACTIVE

## 2018-09-04 NOTE — Progress Notes (Signed)
NEUROLOGY PROGRESS NOTE  Subjective: Continues to have hand and leg paresthesias.  Exam: Vitals:   09/03/18 2203 09/04/18 0506  BP: (!) 105/58 (!) 103/54  Pulse: 78 80  Resp: 16 16  Temp: 98.3 F (36.8 C) 98 F (36.7 C)  SpO2: 100% 100%    Physical Exam   HEENT-  Normocephalic, no lesions, without obvious abnormality.  Normal external eye and conjunctiva.   Musculoskeletal-no joint tenderness, deformity or swelling Skin-warm and dry, no hyperpigmentation, vitiligo, or suspicious lesions    Neuro:  Mental Status: Alert, oriented, thought content appropriate.  Speech fluent without evidence of aphasia.  Able to follow 3 step commands without difficulty. Cranial Nerves: II:  Visual fields grossly normal,  III,IV, VI: ptosis not present, extra-ocular motions intact bilaterally pupils equal, round, reactive to light and accommodation V,VII: smile symmetric, facial light touch sensation normal bilaterally VIII: hearing normal bilaterally IX,X: uvula rises midline XI: bilateral shoulder shrug XII: midline tongue extension Motor: Right : Upper extremity   5/5    Left:     Upper extremity   5/5  Lower extremity   5/5     Lower extremity   5/5 We weakness in bilateral lower extremities when distracted Sensory: Pinprick and light touch intact throughout, bilaterally Deep Tendon Reflexes: 3+ and symmetric throughout Plantars: Right: downgoing   Left: downgoing Cerebellar: normal finger-to-nose,  and normal heel-to-shin test     Medications:  Scheduled: . sodium chloride flush  3 mL Intravenous Q12H   Continuous:  ZOX:[id number]:acetaminophen **OR** acetaminophen, HYDROcodone-acetaminophen, ondansetron **OR** ondansetron (ZOFRAN) IV, senna-docusate  Pertinent Labs/Diagnostics: MRI of brain, cervical spine, thoracic spine, lumbar spine all within normal limits  Mr Thoracic Spine W Wo Contrast  Result Date: 09/02/2018 CLINICAL DATA:  Acute/progressive myelopathy. Numbness and  tingling of the hands and legs. EXAM: MRI THORACIC WITHOUT AND WITH CONTRAST TECHNIQUE: Multiplanar and multiecho pulse sequences of the thoracic spine were obtained without and with intravenous contrast. CONTRAST:  4 cc Gadavist COMPARISON:  Cervical and lumbar examinations earlier today. FINDINGS: MRI THORACIC SPINE FINDINGS Alignment:  Normal Vertebrae: Normal Cord:  Normal.  No focal cord lesion.  No abnormal enhancement. Paraspinal and other soft tissues: Normal Disc levels: All disc levels appear normal. No degeneration, bulge or herniation. No facet arthropathy. No canal or foraminal stenosis. IMPRESSION: Normal thoracic examination. No abnormality seen to explain the presenting symptoms. Electronically Signed   By: Paulina FusiMark  Shogry M.D.   On: 09/02/2018 18:59     Felicie MornDavid  PA-C [facility] [phone]   Assessment: As noted prior 22 year old female with past medical history of migraine, chronic pain over her whole body, thyromegaly and hyperthyroidism presenting with 2 weeks of progressive weakness numbness of her hands and feet.  Thus far imaging and labs have not shown any significant abnormalities.    Impression:  Progressive bilateral leg weakness of unclear etiology Bilateral hand and leg paresthesias History of chronic pain.  Recommendations: -LP to be done under fluoroscopy today.-We will continue to follow LP.      09/04/2018, 10:16 AM

## 2018-09-04 NOTE — Progress Notes (Signed)
PROGRESS NOTE                                                                                                                                                                                                             Patient Demographics:    Nicole Evans, is a 22 y.o. female, DOB - 08/01/1996, GMW:[id number]  Admit date - 09/01/2018   Admitting Physician Briscoe Deutscherimothy S Opyd, MD  Outpatient Primary MD for the patient is Deeann SaintBanks, Shannon R, MD  LOS - 0   Chief Complaint  Patient presents with  . Dizziness       Brief Narrative    22 y.o. female who denies any significant past medical history, now presenting to the emergency department for evaluation of numbness and tingling in all extremities, as well as generalized weakness and fatigue.  He was seen by neurology service, who recommended admission for further work-up   Subjective:    Nicole Crazelexis Scholler was seen and examined this morning, still complaining of a progressive bilateral lower leg weakness, bilateral hand and leg paresthesia. Today evening also had a headache, some neck stiffness per patient and nursing staff which has improved.  She denies of any headaches visual changes or asymmetric weaknesses this morning. Incising that she is weak and numb below her knees now. Her hands paresthesia remains the same, strength is intact.  Denies of having any fever or chills. No other issues overnight.     Assessment  & Plan :    Principal Problem:   Numbness and tingling of upper and lower extremities of both sides Active Problems:   Leukopenia   Hyponatremia   Vitamin D deficiency  Bilateral lower extremity and hand paresthesia/lower extremity weakness -Reporting progressive bilateral lower leg weakness, numbness in hands and legs. - lower extremity weakness is below the knee with tingling and subjective paresthesia. - Presents with 2 wks of numbness and tingling in all extremities,    -neurology consult greatly appreciated,  -MRI brain, C-spine, and lumbar spines were obtained, no acute findings,  -MRI thoracic spine is completed within normal limits -No further imaging was commended, -ANA and RF tested - normal. -Neurology has recommended LP under fluoroscopy, CSF studies to be followed  -Anticipating LP under fluoroscopy today  Headaches with a brief neck stiffness  -Per patient and nursing staff yesterday evening transient -Improved -  Moderate to PRN analgesics  hyponatremia  -Stable - Serum potassium is 133 on admission, appears to be hypovolemic , corrected with IV fluids  Leukopenia  - WBC is 3,200 on admission, previously normal  - No fever or infectious sxs, autoimmune serologies recently negative  - Culture if febrile, repeat CBC >>> WBC 5.2, normal -HIV pending  Vit D deficiency  - Recently diagnosed by PCP and started on supplementation      Code Status : Full  Family Communication  : Parents at bedside  Disposition Plan: He is under observation status, planning for LP, further neurological evaluation today.  Home when stable   Consults  :  Neurology  Procedures  : none  DVT Prophylaxis  :  SCD  Lab Results  Component Value Date   PLT 311 09/03/2018    Antibiotics  :    Anti-infectives (From admission, onward)   None        Objective:   Vitals:   09/03/18 0423 09/03/18 1532 09/03/18 2203 09/04/18 0506  BP: (!) 100/54 107/60 (!) 105/58 (!) 103/54  Pulse: 82 79 78 80  Resp: 17 16 16 16   Temp: 98.1 F (36.7 C) 98.5 F (36.9 C) 98.3 F (36.8 C) 98 F (36.7 C)  TempSrc: Oral Oral Oral Oral  SpO2: 100% 100% 100% 100%  Weight:      Height:        Wt Readings from Last 3 Encounters:  09/01/18 56.2 kg  05/04/18 55.8 kg  12/27/17 60.1 kg     Intake/Output Summary (Last 24 hours) at 09/04/2018 1137 Last data filed at 09/03/2018 2230 Gross per 24 hour  Intake 3 ml  Output -  Net 3 ml    BP (!) 103/54 (BP  Location: Right Arm)   Pulse 80   Temp 98 F (36.7 C) (Oral)   Resp 16   Ht 5\' 5"  (1.651 m)   Wt 56.2 kg   LMP 07/26/2018   SpO2 100%   BMI 20.63 kg/m    Physical Exam  Constitution:  Alert, cooperative, no distress,  Psychiatric: Normal and stable mood and affect, cognition intact,   HEENT: Normocephalic, PERRL, otherwise with in Normal limits  Chest:Chest symmetric Cardio vascular:  S1/S2, RRR, No murmure, No Rubs or Gallops  pulmonary: Clear to auscultation bilaterally, respirations unlabored, negative wheezes / crackles Abdomen: Soft, non-tender, non-distended, bowel sounds,no masses, no organomegaly Muscular skeletal: Limited exam - in bed, able to move all 4 extremities, could not determine full strength of lower extremities. Active bilateral paresthesia, able to locate to touch, able to tell between hot and cold. Neuro: CNII-XII intact. , normal motor and sensation, reflexes intact objective hand and lower extremity paresthesia, weakness lower extremities below the knee Extremities: No pitting edema lower extremities, +2 pulses  Skin: Dry, warm to touch, negative for any Rashes, No open wounds Wounds: per nursing documentation       Data Review:    CBC Recent Labs  Lab 09/01/18 1141 09/02/18 0405 09/03/18 0340  WBC 3.2* 4.8 5.2  HGB 11.3* 10.8* 11.4*  HCT 35.7* 33.1* 36.3  PLT 325 320 311  MCV 90.4 89.5 89.2  MCH 28.6 29.2 28.0  MCHC 31.7 32.6 31.4  RDW 13.7 13.5 13.5  LYMPHSABS 1.3 2.3  --   MONOABS 0.3 0.5  --   EOSABS 0.1 0.1  --   BASOSABS 0.0 0.0  --     Chemistries  Recent Labs  Lab 09/01/18 1141 09/02/18 0405 09/03/18  0340  NA 133* 138 138  K 3.8 3.2* 4.0  CL 106 107 109  CO2 22 24 21*  GLUCOSE 93 126* 95  BUN 21* 12 13  CREATININE 0.64 0.80 0.83  CALCIUM 8.8*  9.4 8.5* 9.1  AST 15  --   --   ALT 21  --   --   ALKPHOS 72  --   --   BILITOT 1.0  --   --     ------------------------------------------------------------------------------------------------------------------ No results for input(s): CHOL, HDL, LDLCALC, TRIG, CHOLHDL, LDLDIRECT in the last 72 hours.  Lab Results  Component Value Date   HGBA1C 5.5 05/04/2018   ------------------------------------------------------------------------------------------------------------------ Recent Labs    09/03/18 1133  TSH 0.483   ------------------------------------------------------------------------------------------------------------------- No results found for: BNP  Inpatient Medications  Scheduled Meds: . sodium chloride flush  3 mL Intravenous Q12H   Continuous Infusions: PRN Meds:.acetaminophen **OR** acetaminophen, HYDROcodone-acetaminophen, ondansetron **OR** ondansetron (ZOFRAN) IV, senna-docusate  Micro Results No results found for this or any previous visit (from the past 240 hour(s)).  Radiology Reports Dg Lumbar Spine Complete  Result Date: 09/01/2018 CLINICAL DATA:  Low back pain for the past 2 weeks. Bilateral lower extremity radicular symptoms. EXAM: LUMBAR SPINE - COMPLETE 4+ VIEW COMPARISON:  None in PACs FINDINGS: The L1 transverse processes are assumed to be transitional. The lumbar vertebral bodies are preserved in height. The disc space heights are well maintained. There is no spondylolisthesis. The pedicles and transverse processes are intact. The IUD is in stable position. IMPRESSION: There is no acute or significant chronic bony abnormality of the lumbar spine. Electronically Signed   By: David  [place] M.D.   On: 09/01/2018 12:18   Ct Head Wo Contrast  Result Date: 09/01/2018 CLINICAL DATA:  Ataxia headache numbness in the lower extremity EXAM: CT HEAD WITHOUT CONTRAST TECHNIQUE: Contiguous axial images were obtained from the base of the skull through the vertex without intravenous contrast. COMPARISON:  None. FINDINGS: Brain: No evidence of acute infarction,  hemorrhage, hydrocephalus, extra-axial collection or mass lesion/mass effect. Vascular: No hyperdense vessel or unexpected calcification. Skull: Normal. Negative for fracture or focal lesion. Sinuses/Orbits: No acute finding. Other: None IMPRESSION: Negative non contrasted CT appearance of the brain Electronically Signed   By: Jasmine Pang M.D.   On: 09/01/2018 18:08   Mr Laqueta Jean MV Contrast  Result Date: 09/02/2018 CLINICAL DATA:  Numbness and tingling, paresthesias hands and legs. EXAM: MRI HEAD WITHOUT AND WITH CONTRAST TECHNIQUE: Multiplanar, multiecho pulse sequences of the brain and surrounding structures were obtained without and with intravenous contrast. CONTRAST:  5 mL Gadovist IV COMPARISON:  CT head 09/01/2018 FINDINGS: Brain: No acute infarction, hemorrhage, hydrocephalus, extra-axial collection or mass lesion. Negative for demyelinating disease. Normal enhancement postcontrast administration. Vascular: Normal arterial flow void Skull and upper cervical spine: Negative Sinuses/Orbits: Mild mucosal edema paranasal sinuses.  Normal orbit Other: None IMPRESSION: Normal MRI head with contrast. Electronically Signed   By: Marlan Palau M.D.   On: 09/02/2018 08:24   Mr Cervical Spine W Wo Contrast  Result Date: 09/02/2018 CLINICAL DATA:  Numbness and tingling hands and legs.  Paresthesias. EXAM: MRI CERVICAL SPINE WITHOUT AND WITH CONTRAST TECHNIQUE: Multiplanar and multiecho pulse sequences of the cervical spine, to include the craniocervical junction and cervicothoracic junction, were obtained without and with intravenous contrast. CONTRAST:  5 mL Gadovist IV COMPARISON:  None. FINDINGS: Alignment: Normal Vertebrae: Negative for fracture or mass. Small hemangioma C7 vertebral body Cord: Normal signal and morphology Posterior Fossa, vertebral arteries,  paraspinal tissues: Negative Disc levels: Negative for disc protrusion or spinal stenosis. 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 Normal enhancement postcontrast administration. IMPRESSION: Normal appearing cervical spinal cord. Negative for significant degenerative change or neural impingement in the cervical spine. Electronically Signed   By: Marlan Palau M.D.   On: 09/02/2018 07:51   Mr Thoracic Spine W Wo Contrast  Result Date: 09/02/2018 CLINICAL DATA:  Acute/progressive myelopathy. Numbness and tingling of the hands and legs. EXAM: MRI THORACIC WITHOUT AND WITH CONTRAST TECHNIQUE: Multiplanar and multiecho pulse sequences of the thoracic spine were obtained without and with intravenous contrast. CONTRAST:  4 cc Gadavist COMPARISON:  Cervical and lumbar examinations earlier today. FINDINGS: MRI THORACIC SPINE FINDINGS Alignment:  Normal Vertebrae: Normal Cord:  Normal.  No focal cord lesion.  No abnormal enhancement. Paraspinal and other soft tissues: Normal Disc levels: All disc levels appear normal. No degeneration, bulge or herniation. No facet arthropathy. No canal or foraminal stenosis. IMPRESSION: Normal thoracic examination. No abnormality seen to explain the presenting symptoms. Electronically Signed   By: Paulina Fusi M.D.   On: 09/02/2018 18:59   Mr Lumbar Spine W Wo Contrast  Result Date: 09/02/2018 CLINICAL DATA:  Two week history of tingling and numbness hands and legs. Paresthesia. Back pain EXAM: MRI LUMBAR SPINE WITHOUT AND WITH CONTRAST TECHNIQUE: Multiplanar and multiecho pulse sequences of the lumbar spine were obtained without and with intravenous contrast. CONTRAST:  5 mL Gadovist IV COMPARISON:  Lumbar radiographs 09/01/2018 FINDINGS: Segmentation:  Normal Alignment:  Normal Vertebrae:  Normal bone marrow.  Negative for fracture or mass. Conus medullaris and cauda equina: Conus extends to the L2 level. Conus and cauda equina appear normal. Paraspinal and other soft tissues: Negative for paraspinous mass or fluid collection. Large amount of  stool in the rectum. Disc levels: Normal disc spaces.  Negative for disc protrusion or stenosis. IMPRESSION: Normal MRI lumbar spine with contrast Large amount of stool in the rectum. Electronically Signed   By: Marlan Palau M.D.   On: 09/02/2018 08:27     Kendell Bane M.D on 09/04/2018 at 11:37 AM  Between 7am to 7pm - Pager - [phone]  After 7pm go to [email or web address] - password [facility]  [facility] -  Office  [phone]

## 2018-09-04 NOTE — Progress Notes (Signed)
Pt requesting information about when she would have the lumbar puncture procedure. Writer paged Neurology PA Felicie Mornavid Smith for more information. Waiting to hear back about plan of care.

## 2018-09-04 NOTE — Progress Notes (Signed)
Attempted to assess pt, but pt was taking shower with assistance from boyfriend. Will try again later.

## 2018-09-04 NOTE — Evaluation (Signed)
Physical Therapy Evaluation Patient Details Name: Nicole Evans Nicole Winstanley MRN: [id number] DOB: 01/18/1996 Today's Date: 09/04/2018   History of Present Illness  22 y.o. female admitted on 09/01/18 for UE/LE numbness and weakness.  Currently MRI and other workup normal.  Pt due to have a LP 09/04/18.  Pt with significant PMH of anemia, and migraine.  Clinical Impression  Pt with 4 extremity weakness and spotty numbness pattern (R forearm, left elbow, right lateral thigh, L inner calf).  Her tested bed level strength is weaker than her functional strength walking.  She is very slow and reports stiffness, numbness, and pain.  She has two small children at home (they are currently with her parents in [place] right now) and reports her boyfriend will provide physical assist at discharge.  I would recommend OP PT follow up if weakness does not improve significantly before discharge.   PT to follow acutely for deficits listed below.      Follow Up Recommendations Outpatient PT    Equipment Recommendations  None recommended by PT    Recommendations for Other Services   NA     Precautions / Restrictions   NA     Mobility  Bed Mobility Overal bed mobility: Modified Independent             General bed mobility comments: slow, but without help  Transfers Overall transfer level: Needs assistance Equipment used: 1 person hand held assist Transfers: Sit to/from Stand Sit to Stand: Min assist         General transfer comment: Min hand held assist to transition to stand, slow transitions.  Ambulation/Gait Ambulation/Gait assistance: Min assist Gait Distance (Feet): 100 Feet Assistive device: 1 person hand held assist Gait Pattern/deviations: Step-through pattern Gait velocity: decreased Gait velocity interpretation: <1.8 ft/sec, indicate of risk for recurrent falls General Gait Details: Min hand held assist,  Pt moving slowly, but without signs of asymmetry in LE strength, functionally  stronger than her presented MMT.  Hand held assist lessened as we progressed further.  No reports of lightheadedness or dizziness during gait.        Balance Overall balance assessment: Needs assistance Sitting-balance support: Feet supported;No upper extremity supported Sitting balance-Leahy Scale: Good     Standing balance support: Single extremity supported Standing balance-Leahy Scale: Poor Standing balance comment: needs external assist in standing.                              Pertinent Vitals/Pain Pain Assessment: Faces Faces Pain Scale: Hurts even more Pain Location: neck, LEs after gait Pain Descriptors / Indicators: Aching;Burning Pain Intervention(s): Limited activity within patient's tolerance;Monitored during session;Repositioned    Home Living Family/patient expects to be discharged to:: Private residence Living Arrangements: Children(1 y.o. and 3 y.o.) Available Help at Discharge: Other (Comment)(boyfriend) Type of Home: House Home Access: Level entry     Home Layout: Two level;Able to live on main level with bedroom/bathroom Home Equipment: None      Prior Function Level of Independence: Independent         Comments: works at [employer], stands all day     Hand Dominance   Dominant Hand: Right    Extremity/Trunk Assessment   Upper Extremity Assessment Upper Extremity Assessment: Generalized weakness    Lower Extremity Assessment Lower Extremity Assessment: Generalized weakness(3-/5 bil LEs, slow movements, spotty pattern of numbness)    Cervical / Trunk Assessment Cervical / Trunk Assessment: Other exceptions Cervical /  Trunk Exceptions: reports neck pain and numbness.   Communication   Communication: No difficulties  Cognition Arousal/Alertness: Awake/alert Behavior During Therapy: WFL for tasks assessed/performed Overall Cognitive Status: Within Functional Limits for tasks assessed                                         General Comments General comments (skin integrity, edema, etc.): Pt reports increased LE pain (not numbness) after gait.        Assessment/Plan    PT Assessment Patient needs continued PT services  PT Problem List Decreased strength;Decreased activity tolerance;Decreased balance;Decreased mobility;Decreased cognition;Decreased safety awareness;Decreased knowledge of use of DME;Decreased knowledge of precautions;Pain;Impaired sensation       PT Treatment Interventions DME instruction;Gait training;Stair training;Functional mobility training;Therapeutic activities;Therapeutic exercise;Balance training;Patient/family education;Neuromuscular re-education    PT Goals (Current goals can be found in the Care Plan section)  Acute Rehab PT Goals Patient Stated Goal: to get stronger and go home, decrease pain, figure out what is wrong with her. PT Goal Formulation: With patient Time For Goal Achievement: 09/18/18 Potential to Achieve Goals: Good    Frequency Min 3X/week           AM-PAC PT "6 Clicks" Mobility  Outcome Measure Help needed turning from your back to your side while in a flat bed without using bedrails?: None Help needed moving from lying on your back to sitting on the side of a flat bed without using bedrails?: None Help needed moving to and from a bed to a chair (including a wheelchair)?: A Little Help needed standing up from a chair using your arms (e.g., wheelchair or bedside chair)?: A Little Help needed to walk in hospital room?: A Little Help needed climbing 3-5 steps with a railing? : A Little 6 Click Score: 20    End of Session Equipment Utilized During Treatment: Gait belt Activity Tolerance: Patient limited by pain Patient left: in bed;with call bell/phone within reach;with bed alarm set   PT Visit Diagnosis: Muscle weakness (generalized) (M62.81);Pain Pain - Right/Left: (bilateral ) Pain - part of body: (bil legs)    Time: [id number] PT  Time Calculation (min) (ACUTE ONLY): 22 min   Charges:           Lurena Joinerebecca B. Landin Tallon, PT, DPT  Acute Rehabilitation #[phone]) [phone] pager #[phone] office   PT Evaluation $PT Eval Moderate Complexity: 1 Mod         09/04/2018, 2:02 PM

## 2018-09-05 ENCOUNTER — Inpatient Hospital Stay (HOSPITAL_COMMUNITY): Payer: BLUE CROSS/BLUE SHIELD

## 2018-09-05 LAB — CSF CELL COUNT WITH DIFFERENTIAL
RBC Count, CSF: 0 /mm3
Tube #: 3
WBC, CSF: 1 /mm3 (ref 0–5)

## 2018-09-05 LAB — PROTEIN AND GLUCOSE, CSF
Glucose, CSF: 54 mg/dL (ref 40–70)
Total  Protein, CSF: 13 mg/dL — ABNORMAL LOW (ref 15–45)

## 2018-09-05 MED ORDER — SODIUM CHLORIDE 0.9 % IV BOLUS
500.0000 mL | Freq: Once | INTRAVENOUS | Status: AC
  Administered 2018-09-05: 500 mL via INTRAVENOUS

## 2018-09-05 MED ORDER — ONDANSETRON HCL 4 MG PO TABS: 4.0000 mg | ORAL_TABLET | Freq: Four times a day (QID) | ORAL | 0 refills | Status: DC | PRN

## 2018-09-05 MED ORDER — LIDOCAINE HCL (PF) 1 % IJ SOLN
5.0000 mL | Freq: Once | INTRAMUSCULAR | Status: AC
  Administered 2018-09-05: 3 mL via INTRADERMAL
  Filled 2018-09-05: qty 5

## 2018-09-05 NOTE — Progress Notes (Signed)
Physical Therapy Treatment Patient Details Name: Nicole Evans MRN: [id number] DOB: 09/01/1996 Today's Date: 09/05/2018    History of Present Illness 22 y.o. female admitted on 09/01/18 for UE/LE numbness and weakness.  Currently MRI and other workup normal.  Pt due to have a LP 09/04/18.  Pt with significant PMH of anemia, and migraine.    PT Comments    Patient seen for mobility progression. Patient performing bed mobility at Mod I with increased time. Light Min A for transfers and gait with 1 HHA with cueing for safety and sequencing. Patient without LOB or overt instability with mobility. [facility] with gait progression. Controlled descent with stand to sit transfer. PT to continue to follow.     Follow Up Recommendations  Outpatient PT     Equipment Recommendations  None recommended by PT    Recommendations for Other Services       Precautions / Restrictions Precautions Precautions: Fall Restrictions Weight Bearing Restrictions: No    Mobility  Bed Mobility Overal bed mobility: Modified Independent             General bed mobility comments: slow, but without help  Transfers Overall transfer level: Needs assistance Equipment used: 1 person hand held assist Transfers: Sit to/from Stand Sit to Stand: Min assist;Min guard         General transfer comment: light Min A with cueing to push up from bed - very slow to transition  Ambulation/Gait Ambulation/Gait assistance: Min assist;Min guard Gait Distance (Feet): 150 Feet Assistive device: 1 person hand held assist Gait Pattern/deviations: Step-through pattern;Decreased stride length;Narrow base of support Gait velocity: decreased   General Gait Details: 1 HHA for stability - patient reporting pain at L ankle limiting - otherwise no apparent strength deficits; pressure through HHA lessened with gait progression   Stairs             Wheelchair Mobility     Modified Rankin (Stroke Patients Only)       Balance Overall balance assessment: Needs assistance Sitting-balance support: Feet supported;No upper extremity supported Sitting balance-Leahy Scale: Good     Standing balance support: Single extremity supported Standing balance-Leahy Scale: Poor Standing balance comment: needs external assist in standing.                             Cognition Arousal/Alertness: Awake/alert Behavior During Therapy: WFL for tasks assessed/performed Overall Cognitive Status: Within Functional Limits for tasks assessed                                        Exercises      General Comments        Pertinent Vitals/Pain Pain Assessment: Faces Faces Pain Scale: Hurts little more Pain Location: L ankle Pain Descriptors / Indicators: Aching;Discomfort Pain Intervention(s): Limited activity within patient's tolerance;Monitored during session;Premedicated before session    Home Living                      Prior Function            PT Goals (current goals can now be found in the care plan section) Acute Rehab PT Goals Patient Stated Goal: to get stronger and go home, decrease pain, figure out what is wrong with her. PT Goal Formulation: With patient Time For  Goal Achievement: 09/18/18 Potential to Achieve Goals: Good Progress towards PT goals: Progressing toward goals    Frequency    Min 3X/week      PT Plan Current plan remains appropriate    Co-evaluation              AM-PAC PT "6 Clicks" Mobility   Outcome Measure  Help needed turning from your back to your side while in a flat bed without using bedrails?: None Help needed moving from lying on your back to sitting on the side of a flat bed without using bedrails?: None Help needed moving to and from a bed to a chair (including a wheelchair)?: A Little Help needed standing up from a chair using your arms (e.g., wheelchair or bedside  chair)?: A Little Help needed to walk in hospital room?: A Little Help needed climbing 3-5 steps with a railing? : A Little 6 Click Score: 20    End of Session Equipment Utilized During Treatment: Gait belt Activity Tolerance: Patient tolerated treatment well Patient left: in bed;with call bell/phone within reach Nurse Communication: Mobility status PT Visit Diagnosis: Muscle weakness (generalized) (M62.81);Pain Pain - Right/Left: (bilateral ) Pain - part of body: (bil legs)     Time: [id number] PT Time Calculation (min) (ACUTE ONLY): 13 min  Charges:  $Gait Training: 8-22 mins                     Kipp LaurenceStephanie R Aaron, PT, DPT Supplemental Physical Therapist 09/05/18 9:54 AM Pager: [phone] Office: [phone]

## 2018-09-05 NOTE — Discharge Summary (Signed)
Physician Discharge Summary [facility]    Patient: Nicole Evans                   Admit date: 09/01/2018   DOB: 09-17-1995             Discharge date:09/05/2018/4:45 PM GUR:[id number]                           PCP: [name], MD  Recommendations for Outpatient Follow-up:   . Follow up: These follow-up with your primary care doctor and neurologist in 1 to 2 weeks  Discharge Condition: Stable   Code Status:   Code Status: Full Code  Diet recommendation: Regular healthy diet   Discharge Diagnoses:    Principal Problem:   Numbness and tingling of upper and lower extremities of both sides Active Problems:   Leukopenia   Hyponatremia   Vitamin D deficiency   Paresthesia and pain of extremity   History of Present Illness/ Hospital Course Kathleen Argue Summary:    Nicole Evans is a22 y.o.femalewho denies any significant past medical history, now presenting to the emergency department for evaluation of numbness and tingling in all extremities, as well as generalized weakness and fatigue.  He was seen by neurology service, who recommended admission for further work-up.  Bilateral lower extremity and hand paresthesia/lower extremity weakness -Reporting progressive bilateral lower leg weakness, numbness in hands and legs. -Although her symptoms does not follow any particular pathology or disease -She continues to complain of pain and weakness especially in her lower extremities, reporting that the paresthesia in lower extremities comes and goes but her hand are improving, only with some numbness and weakness  -Presents with 2 wks of numbness and tingling in all extremities,  -Neurology >>>was  following very closely, did not have any diagnoses, possible somatization -MRI brain, C-spine, and lumbar spines were obtained, no acute findings,  -MRI thoracic spine is completed within normal limits -No further imaging was commended, -ANA and RF/ ESR/ CRP  tested - were all normal. -Neurology has recommended LP under fluoroscopy, completed on 09/05/2018, CSF studies to be followed  -Anticipating LP under fluoroscopy today Neurology called, LP with fluoroscopy scopic guidance was conducted, clear CSF fluid was noted clear analysis, anticipating no signs of infection or pathology recommended for patient to be discharged, home.  Headaches with a brief neck stiffness  -Resolved -Per patient and nursing staff yesterday evening transient -Moderate to PRN analgesics  Episode of hypotension overnight -Likely exacerbated by pain medication -Responded to fluid resuscitation -Asymptomatic as far as hypotension this morning. -Blood pressure stabilized  hyponatremia -Stable -Serum potassium is 133 on admission, appears to be hypovolemic, corrected with IV fluids  Leukopenia -WBC is 3,200 on admission, previously normal -No fever or infectious sxs, autoimmune serologies recently negative -Culture if febrile, repeat CBC >>> WBC 5.2, normal -HIV negative   Vit D deficiency -Recently diagnosed by PCP and started on supplementation    Code Status : Full Family Communication: Parents mom was updated  Disposition Plan: Status post normal lumbar puncture evaluation.  Cleared by neurologist to be discharged   consults  :  Neurology  Procedures  : none   Discharge Instructions:   Discharge Instructions    Activity as tolerated - No restrictions   Complete by:  As directed    Ambulatory referral to Physical Therapy   Complete by:  As directed    Diet - low sodium heart  healthy   Complete by:  As directed    Discharge instructions   Complete by:  As directed    Clear to be discharged home, anticipating symptoms to resolve the next few days, work-up was completed in detail, we do not anticipate any further pathology to results after this work-up.  CSF has been clean.  No signs of infection, or abnormal  pathology.  We recommend you follow-up with PCP and neurologist as an outpatient in next 2 to 3 weeks.   Increase activity slowly   Complete by:  As directed        Medication List    TAKE these medications   cholecalciferol 25 MCG (1000 UT) tablet Commonly known as:  VITAMIN D3 Take 1,000 Units by mouth daily.   ondansetron 4 MG tablet Commonly known as:  ZOFRAN Take 1 tablet (4 mg total) by mouth every 6 (six) hours as needed for nausea.   prenatal multivitamin Tabs tablet Take 1 tablet by mouth at bedtime.       Allergies  Allergen Reactions  . Penicillins Anaphylaxis, Rash and Other (See Comments)    Has patient had a PCN reaction causing immediate rash, facial/tongue/throat swelling, SOB or lightheadedness with hypotension: Yes Has patient had a PCN reaction causing severe rash involving mucus membranes or skin necrosis: No Has patient had a PCN reaction that required hospitalization No Has patient had a PCN reaction occurring within the last 10 years: No If all of the above answers are "NO", then may proceed with Cephalosporin use.     Procedures /Studies:   Dg Lumbar Spine Complete  Result Date: 09/01/2018 CLINICAL DATA:  Low back pain for the past 2 weeks. Bilateral lower extremity radicular symptoms. EXAM: LUMBAR SPINE - COMPLETE 4+ VIEW COMPARISON:  None in PACs FINDINGS: The L1 transverse processes are assumed to be transitional. The lumbar vertebral bodies are preserved in height. The disc space heights are well maintained. There is no spondylolisthesis. The pedicles and transverse processes are intact. The IUD is in stable position. IMPRESSION: There is no acute or significant chronic bony abnormality of the lumbar spine. Electronically Signed   By: David  Martinique M.D.   On: 09/01/2018 12:18   Ct Head Wo Contrast  Result Date: 09/01/2018 CLINICAL DATA:  Ataxia headache numbness in the lower extremity EXAM: CT HEAD WITHOUT CONTRAST TECHNIQUE: Contiguous axial  images were obtained from the base of the skull through the vertex without intravenous contrast. COMPARISON:  None. FINDINGS: Brain: No evidence of acute infarction, hemorrhage, hydrocephalus, extra-axial collection or mass lesion/mass effect. Vascular: No hyperdense vessel or unexpected calcification. Skull: Normal. Negative for fracture or focal lesion. Sinuses/Orbits: No acute finding. Other: None IMPRESSION: Negative non contrasted CT appearance of the brain Electronically Signed   By: Donavan Foil M.D.   On: 09/01/2018 18:08   Mr Jeri Cos NU Contrast  Result Date: 09/02/2018 CLINICAL DATA:  Numbness and tingling, paresthesias hands and legs. EXAM: MRI HEAD WITHOUT AND WITH CONTRAST TECHNIQUE: Multiplanar, multiecho pulse sequences of the brain and surrounding structures were obtained without and with intravenous contrast. CONTRAST:  5 mL Gadovist IV COMPARISON:  CT head 09/01/2018 FINDINGS: Brain: No acute infarction, hemorrhage, hydrocephalus, extra-axial collection or mass lesion. Negative for demyelinating disease. Normal enhancement postcontrast administration. Vascular: Normal arterial flow void Skull and upper cervical spine: Negative Sinuses/Orbits: Mild mucosal edema paranasal sinuses.  Normal orbit Other: None IMPRESSION: Normal MRI head with contrast. Electronically Signed   By: Jorja Loa.D.  On: 09/02/2018 08:24   Mr Cervical Spine W Wo Contrast  Result Date: 09/02/2018 CLINICAL DATA:  Numbness and tingling hands and legs.  Paresthesias. EXAM: MRI CERVICAL SPINE WITHOUT AND WITH CONTRAST TECHNIQUE: Multiplanar and multiecho pulse sequences of the cervical spine, to include the craniocervical junction and cervicothoracic junction, were obtained without and with intravenous contrast. CONTRAST:  5 mL Gadovist IV COMPARISON:  None. FINDINGS: Alignment: Normal Vertebrae: Negative for fracture or mass. Small hemangioma C7 vertebral body Cord: Normal signal and morphology Posterior Fossa,  vertebral arteries, paraspinal tissues: Negative Disc levels: Negative for disc protrusion or spinal stenosis. 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 Normal enhancement postcontrast administration. IMPRESSION: Normal appearing cervical spinal cord. Negative for significant degenerative change or neural impingement in the cervical spine. Electronically Signed   By: Franchot Gallo M.D.   On: 09/02/2018 07:51   Mr Thoracic Spine W Wo Contrast  Result Date: 09/02/2018 CLINICAL DATA:  Acute/progressive myelopathy. Numbness and tingling of the hands and legs. EXAM: MRI THORACIC WITHOUT AND WITH CONTRAST TECHNIQUE: Multiplanar and multiecho pulse sequences of the thoracic spine were obtained without and with intravenous contrast. CONTRAST:  4 cc Gadavist COMPARISON:  Cervical and lumbar examinations earlier today. FINDINGS: MRI THORACIC SPINE FINDINGS Alignment:  Normal Vertebrae: Normal Cord:  Normal.  No focal cord lesion.  No abnormal enhancement. Paraspinal and other soft tissues: Normal Disc levels: All disc levels appear normal. No degeneration, bulge or herniation. No facet arthropathy. No canal or foraminal stenosis. IMPRESSION: Normal thoracic examination. No abnormality seen to explain the presenting symptoms. Electronically Signed   By: Nelson Chimes M.D.   On: 09/02/2018 18:59   Mr Lumbar Spine W Wo Contrast  Result Date: 09/02/2018 CLINICAL DATA:  Two week history of tingling and numbness hands and legs. Paresthesia. Back pain EXAM: MRI LUMBAR SPINE WITHOUT AND WITH CONTRAST TECHNIQUE: Multiplanar and multiecho pulse sequences of the lumbar spine were obtained without and with intravenous contrast. CONTRAST:  5 mL Gadovist IV COMPARISON:  Lumbar radiographs 09/01/2018 FINDINGS: Segmentation:  Normal Alignment:  Normal Vertebrae:  Normal bone marrow.  Negative for fracture or mass. Conus medullaris and cauda equina: Conus  extends to the L2 level. Conus and cauda equina appear normal. Paraspinal and other soft tissues: Negative for paraspinous mass or fluid collection. Large amount of stool in the rectum. Disc levels: Normal disc spaces.  Negative for disc protrusion or stenosis. IMPRESSION: Normal MRI lumbar spine with contrast Large amount of stool in the rectum. Electronically Signed   By: Franchot Gallo M.D.   On: 09/02/2018 08:27   Dg Fluoro Guide Lumbar Puncture  Result Date: 09/05/2018 CLINICAL DATA:  22 year old female with unexplained progressive myelopathic symptoms. Patient declined bedside lumbar puncture, fluoroscopic guided lumbar puncture requested. EXAM: DIAGNOSTIC LUMBAR PUNCTURE UNDER FLUOROSCOPIC GUIDANCE FLUOROSCOPY TIME:  Fluoroscopy Time:  0 minutes 6 seconds Radiation Exposure Index (if provided by the fluoroscopic device): Number of Acquired Spot Images: 0 PROCEDURE: Informed consent was obtained from the patient prior to the procedure, including potential complications of headache, allergy, and pain. A "time-out" was performed. With the patient prone, the lower back was prepped with Betadine. 1% Lidocaine was used for local anesthesia. Lumbar puncture was performed at the L2-L3 level using right sub laminar a 3.5 in x 20 gauge needle with return of clear, colorless CSF. Elevated opening pressure (obtained prone) of 31 cm water. 17.5 ml of CSF were obtained  for laboratory studies. The patient tolerated the procedure well and there were no apparent complications. Appropriate post procedural orders were placed on the chart. The patient was returned to the inpatient floor in stable condition for continued treatment. IMPRESSION: 1. Fluoroscopic guided lumbar puncture at L2-L3. 2.  Elevated opening pressure (obtained prone) of 31 cm water. 3. 17.5 mL of CSF were obtained for laboratory studies. Electronically Signed   By: Genevie Ann M.D.   On: 09/05/2018 14:05     Subjective:   Patient was seen and examined  09/05/2018, 4:45 PM Patient stable today. No acute distress.  No issues overnight Stable for discharge.  Discharge Exam:    Vitals:   09/04/18 1329 09/04/18 2112 09/05/18 0549 09/05/18 1102  BP: (!) 106/58 (!) 105/59 (!) 94/51 109/67  Pulse: 78 72 76   Resp: 18     Temp: 98.6 F (37 C) 98.6 F (37 C) 98 F (36.7 C)   TempSrc:  Oral Oral   SpO2: 100% 100% 100%   Weight:      Height:        General: Pt lying comfortably in bed & appears in no obvious distress. Cardiovascular: S1 & S2 heard, RRR, S1/S2 +. No murmurs, rubs, gallops or clicks. No JVD or pedal edema. Respiratory: Clear to auscultation without wheezing, rhonchi or crackles. No increased work of breathing. Abdominal:  Non-distended, non-tender & soft. No organomegaly or masses appreciated. Normal bowel sounds heard. CNS: Alert and oriented. No focal deficits. Extremities: no edema, no cyanosis    The results of significant diagnostics from this hospitalization (including imaging, microbiology, ancillary and laboratory) are listed below for reference.      Microbiology:   Recent Results (from the past 240 hour(s))  CSF culture with Stat gram stain     Status: None (Preliminary result)   Collection Time: 09/05/18  2:25 PM  Result Value Ref Range Status   Specimen Description CSF  Final   Special Requests NONE  Final   Gram Stain   Final    CYTOSPIN SMEAR NO WBC SEEN NO ORGANISMS SEEN Performed at [facility], [address] [place], [place] [address]    Culture PENDING  Incomplete   Report Status PENDING  Incomplete     Labs:   CBC: Recent Labs  Lab 09/01/18 1141 09/02/18 0405 09/03/18 0340  WBC 3.2* 4.8 5.2  NEUTROABS 1.4* 1.8  --   HGB 11.3* 10.8* 11.4*  HCT 35.7* 33.1* 36.3  MCV 90.4 89.5 89.2  PLT 325 320 025   Basic Metabolic Panel: Recent Labs  Lab 09/01/18 1141 09/02/18 0405 09/03/18 0340  NA 133* 138 138  K 3.8 3.2* 4.0  CL 106 107 109  CO2 22 24 21*  GLUCOSE 93 126*  95  BUN 21* 12 13  CREATININE 0.64 0.80 0.83  CALCIUM 8.8*  9.4 8.5* 9.1   Liver Function Tests: Recent Labs  Lab 09/01/18 1141  AST 15  ALT 21  ALKPHOS 72  BILITOT 1.0  PROT 7.5  ALBUMIN 4.2   BNP (last 3 results) No results for input(s): BNP in the last 8760 hours. Cardiac Enzymes: No results for input(s): CKTOTAL, CKMB, CKMBINDEX, TROPONINI in the last 168 hours. CBG: Recent Labs  Lab 09/01/18 1022  GLUCAP 73   Hgb A1c No results for input(s): HGBA1C in the last 72 hours. Lipid Profile No results for input(s): CHOL, HDL, LDLCALC, TRIG, CHOLHDL, LDLDIRECT in the last 72 hours. Thyroid function studies Recent  Labs    09/03/18 1133  TSH 0.483   Anemia work up Recent Labs    09/03/18 1133  VITAMINB12 476   Urinalysis    Component Value Date/Time   COLORURINE YELLOW 09/01/2018 [place] 09/01/2018 1315   APPEARANCEUR Clear 04/03/2015 1621   LABSPEC 1.025 09/01/2018 1315   PHURINE 5.5 09/01/2018 1315   GLUCOSEU NEGATIVE 09/01/2018 1315   HGBUR TRACE (A) 09/01/2018 [place] 09/01/2018 1315   BILIRUBINUR Negative 04/03/2015 1621   KETONESUR NEGATIVE 09/01/2018 [place] 09/01/2018 1315   NITRITE NEGATIVE 09/01/2018 1315   LEUKOCYTESUR NEGATIVE 09/01/2018 1315   LEUKOCYTESUR Negative 04/03/2015 1621    Time coordinating discharge: Over 30 minutes  SIGNED: Deatra James, MD, FACP, FHM. [facility],  Pager [phone]  If 7PM-7AM, please contact night-coverage [email or web address] 09/05/2018, 4:45 PM   September 05, 2018   Patient: Nicole Evans  Date of Birth: 12/29/1995  Date of Visit: 09/01/2018    To Whom it May Concern:  Cataleyah Colborn was at [facility] from 09/01/2018, to 09/05/2018. Recommended to normal activity of daily living in next few days. She may return to work on On September 13, 2018 if remains asymptomatic, regains strength. Patient may need  further evaluation and clearance by primary care doctor and neurologist if remains to be symptomatic.  If you have any questions or concerns, please don't hesitate to call.  Sincerely,  Deatra James, MD (Electronically signed,) [facility]

## 2018-09-05 NOTE — Progress Notes (Addendum)
Neurology Progress Note   S:// Improvement in stiffness in legs. Reports pain all over, more in legs  O:// Current vital signs: BP (!) 94/51 (BP Location: Left Arm)   Pulse 76   Temp 98 F (36.7 C) (Oral)   Resp 18   Ht 5\' 5"  (1.651 m)   Wt 56.2 kg   LMP 07/26/2018   SpO2 100%   BMI 20.63 kg/m  Vital signs in last 24 hours: Temp:  [98 F (36.7 C)-98.6 F (37 C)] 98 F (36.7 C) (12/24 0549) Pulse Rate:  [72-78] 76 (12/24 0549) Resp:  [18] 18 (12/23 1329) BP: (94-106)/(51-59) 94/51 (12/24 0549) SpO2:  [100 %] 100 % (12/24 0549) Gen WD WN NAD HEENT: NCA AT MMM CVS: S1S2+. RRR Resp[:CTABL ABD: ND NT BS+ EXT: warm well perfused, intact pulses NEUROLOGICAL AAOx3 Speech fluent.  No dysarthria.  No aphasia. Cranial nerves: Pupils equal round reactive light, extraocular muscles intact with intact visual fields, face symmetric, facial sensation intact, hearing normal, uvula midline, shoulder shrug intact, tongue midline Motor exam: 5/5 in all 4 extremities with mildly increased tone in lower extremities which is much improved from a prior exam 2 days ago. Also, effort dependent weakness which is overcome by distraction Sensory exam: Intact Deep tendon reflexes brisk all over symmetric with mute plantars Normal finger-nose-finger   Medications  Current Facility-Administered Medications:  .  acetaminophen (TYLENOL) tablet 650 mg, 650 mg, Oral, Q6H PRN, 650 mg at 09/03/18 1340 **OR** acetaminophen (TYLENOL) suppository 650 mg, 650 mg, Rectal, Q6H PRN, Opyd, Lavone Neriimothy S, MD .  HYDROcodone-acetaminophen (NORCO/VICODIN) 5-325 MG per tablet 1 tablet, 1 tablet, Oral, Q6H PRN, Kendell BaneShahmehdi, Seyed A, MD, 1 tablet at 09/04/18 2113 .  ondansetron (ZOFRAN) tablet 4 mg, 4 mg, Oral, Q6H PRN **OR** ondansetron (ZOFRAN) injection 4 mg, 4 mg, Intravenous, Q6H PRN, Opyd, Lavone Neriimothy S, MD .  senna-docusate (Senokot-S) tablet 1 tablet, 1 tablet, Oral, QHS PRN, Opyd, Timothy S, MD .  sodium chloride 0.9  % bolus 500 mL, 500 mL, Intravenous, Once, Shahmehdi, Seyed A, MD .  sodium chloride flush (NS) 0.9 % injection 3 mL, 3 mL, Intravenous, Q12H, Opyd, Lavone Neriimothy S, MD, 3 mL at 09/04/18 2103 Labs CBC    Component Value Date/Time   WBC 5.2 09/03/2018 0340   RBC 4.07 09/03/2018 0340   HGB 11.4 (L) 09/03/2018 0340   HGB 12.1 08/17/2018 1359   HCT 36.3 09/03/2018 0340   HCT 37.2 08/17/2018 1359   PLT 311 09/03/2018 0340   PLT 362 08/17/2018 1359   MCV 89.2 09/03/2018 0340   MCV 89 08/17/2018 1359   MCH 28.0 09/03/2018 0340   MCHC 31.4 09/03/2018 0340   RDW 13.5 09/03/2018 0340   RDW 12.7 08/17/2018 1359   LYMPHSABS 2.3 09/02/2018 0405   LYMPHSABS 2.1 08/17/2018 1359   MONOABS 0.5 09/02/2018 0405   EOSABS 0.1 09/02/2018 0405   EOSABS 0.1 08/17/2018 1359   BASOSABS 0.0 09/02/2018 0405   BASOSABS 0.0 08/17/2018 1359    CMP     Component Value Date/Time   NA 138 09/03/2018 0340   NA 139 08/17/2018 1359   K 4.0 09/03/2018 0340   CL 109 09/03/2018 0340   CO2 21 (L) 09/03/2018 0340   GLUCOSE 95 09/03/2018 0340   BUN 13 09/03/2018 0340   BUN 17 08/17/2018 1359   CREATININE 0.83 09/03/2018 0340   CALCIUM 9.1 09/03/2018 0340   CALCIUM 9.4 09/01/2018 1141   PROT 7.5 09/01/2018 1141   PROT 7.1  08/17/2018 1359   ALBUMIN 4.2 09/01/2018 1141   ALBUMIN 4.4 08/17/2018 1359   AST 15 09/01/2018 1141   ALT 21 09/01/2018 1141   ALKPHOS 72 09/01/2018 1141   BILITOT 1.0 09/01/2018 1141   BILITOT 0.7 08/17/2018 1359   GFRNONAA >60 09/03/2018 0340   GFRAA >60 09/03/2018 0340  No acute events but continues to have some right arm jerking.Imaging reviewed MRI brain, C-spine, T-spine and L-spine unremarkable for any acute process.   Assessment:  22 year old past medical history of migraine, chronic pain, thyromegaly and hypothyroidism presenting 2 weeks hyperreflexive weakness numbness of hands and feet.  Thus far labs and imaging have all been unremarkable Spinal tap is pending.  Patient  refused bedside spinal tap under fluoroscopy a request has been made. In the absence of any elevated protein on CSF studies, I do not see any further inpatient neurological work-up. Differentials at this time would include some sort of conversion disorder versus neuropathy of unknown etiology for which a EMG nerve conduction study would be useful as an outpatient  Recommendations: LP under fluoroscopy guidance Can start a trial of gabapentin 100 3 times daily to see if there is any improvement in the paresthesias Will follow after  -- Milon DikesAshish Kyran Whittier, MD [facility] Pager: [phone] If 7pm to 7am, please call on call as listed on AMION.   Addendum Preliminary results from the spinal tap showed protein of 13 (normal 15-45) and normal glucose.  1 cell.  No albumino cytologic dissociation. Given these findings, I do not think that the patient has any evidence for AIDP. Slightly inconsistent exam, normal CSF, normal brain, C-spine T-spine and L-spine imaging all being unremarkable-all now point towards a more psychosomatic cause than an actual organic cause. No further neurological recommendations at this time. Patient should follow-up with outpatient neurology for a possible EMG nerve conduction study if her symptoms persist.  Some of the CSF test such as myelin basic protein and oligoclonal bands will be available in the next few days.  They will be followed up with outpatient neurology. I will communicate my plan with the primary hospitalist. Please call neurology with questions  -- Milon DikesAshish Naydene Kamrowski, MD [facility] Pager: [phone] If 7pm to 7am, please call on call as listed on AMION.

## 2018-09-05 NOTE — Progress Notes (Signed)
  September 05, 2018   Patient: Nicole Evans  Date of Birth: 02/17/1996  Date of Visit: 09/01/2018    To Whom it May Concern:  [name] [facility] from 09/[facility]/2018, to 09/05/2018. Recommended to return to  normal activity of daily living in next few days. She may return to work on September 13, 2018. If remains asymptomatic, regains strength. Patient may need further evaluation and clearance by primary care doctor and neurologist if remains to be symptomatic.  If you have any questions or concerns, please don't hesitate to call.  Sincerely,  Kendell BaneSeyed A Bradyn Soward, MD (Electronically signed,) [facility]

## 2018-09-05 NOTE — Discharge Instructions (Signed)
Dizziness Dizziness is a common problem. It is a feeling of unsteadiness or light-headedness. You may feel like you are about to faint. Dizziness can lead to injury if you stumble or fall. Anyone can become dizzy, but dizziness is more common in older adults. This condition can be caused by a number of things, including medicines, dehydration, or illness. Follow these instructions at home: Eating and drinking  Drink enough fluid to keep your urine clear or pale yellow. This helps to keep you from becoming dehydrated. Try to drink more clear fluids, such as water.  Do not drink alcohol.  Limit your caffeine intake if told to do so by your health care provider. Check ingredients and nutrition facts to see if a food or beverage contains caffeine.  Limit your salt (sodium) intake if told to do so by your health care provider. Check ingredients and nutrition facts to see if a food or beverage contains sodium. Activity  Avoid making quick movements. ? Rise slowly from chairs and steady yourself until you feel okay. ? In the morning, first sit up on the side of the bed. When you feel okay, stand slowly while you hold onto something until you know that your balance is fine.  If you need to stand in one place for a long time, move your legs often. Tighten and relax the muscles in your legs while you are standing.  Do not drive or use heavy machinery if you feel dizzy.  Avoid bending down if you feel dizzy. Place items in your home so that they are easy for you to reach without leaning over. Lifestyle  Do not use any products that contain nicotine or tobacco, such as cigarettes and e-cigarettes. If you need help quitting, ask your health care provider.  Try to reduce your stress level by using methods such as yoga or meditation. Talk with your health care provider if you need help to manage your stress. General instructions  Watch your dizziness for any changes.  Take over-the-counter and  prescription medicines only as told by your health care provider. Talk with your health care provider if you think that your dizziness is caused by a medicine that you are taking.  Tell a friend or a family member that you are feeling dizzy. If he or she notices any changes in your behavior, have this person call your health care provider.  Keep all follow-up visits as told by your health care provider. This is important. Contact a health care provider if:  Your dizziness does not go away.  Your dizziness or light-headedness gets worse.  You feel nauseous.  You have reduced hearing.  You have new symptoms.  You are unsteady on your feet or you feel like the room is spinning. Get help right away if:  You vomit or have diarrhea and are unable to eat or drink anything.  You have problems talking, walking, swallowing, or using your arms, hands, or legs.  You feel generally weak.  You are not thinking clearly or you have trouble forming sentences. It may take a friend or family member to notice this.  You have chest pain, abdominal pain, shortness of breath, or sweating.  Your vision changes.  You have any bleeding.  You have a severe headache.  You have neck pain or a stiff neck.  You have a fever. These symptoms may represent a serious problem that is an emergency. Do not wait to see if the symptoms will go away. Get medical help   right away. Call your local emergency services (911 in the [place] Do not drive yourself to the hospital. Summary  Dizziness is a feeling of unsteadiness or light-headedness. This condition can be caused by a number of things, including medicines, dehydration, or illness.  Anyone can become dizzy, but dizziness is more common in older adults.  Drink enough fluid to keep your urine clear or pale yellow. Do not drink alcohol.  Avoid making quick movements if you feel dizzy. Monitor your dizziness for any changes. This information is not intended to  replace advice given to you by your health care provider. Make sure you discuss any questions you have with your health care provider. Document Released: 02/23/2001 Document Revised: 10/02/2016 Document Reviewed: 10/02/2016 Elsevier Interactive Patient Education  2019 [employer]vier [employer]  

## 2018-09-05 NOTE — Progress Notes (Signed)
Pt BP 95/41. Pt denies dizziness or feeling faint. MD paged. Will treat according to MD orders and continue to monitor.

## 2018-09-05 NOTE — Care Management Note (Addendum)
Case Management Note  Patient Details  Name: Nicole Evans MRN: [id number] Date of Birth: 08/15/1996  Subjective/Objective: Admitted with UE/LE numbness and weakness. Hx of anemia, and migraine. Resides with boyfriend, Ladene ArtistDerrick. PTA independent with DL's , no DME usage  Shearon StallsDerrick Nelson ([place])     [phone]      PCP: Abbe AmsterdamShannon Banks  Action/Plan: Per PT's recommendations: Outpatient PT. Pt agreeable to outpatient PT. NCM made referral and noted on AVS..  Pt states has transportation to home once d/c.  Expected Discharge Date:                  Expected Discharge Plan:  Home/Self Care  In-House Referral:  NA  Discharge planning Services  CM Consult  Post Acute Care Choice:    Choice offered to:  Patient  DME Arranged:    DME Agency:     HH Arranged:  NA HH Agency:  NA  Status of Service:  Completed, signed off  If discussed at Long Length of Stay Meetings, dates discussed:    Additional Comments:  Epifanio LeschesCole, Flavio Lindroth Hudson, RN 09/05/2018, 10:58 AM

## 2018-09-05 NOTE — Progress Notes (Signed)
PROGRESS NOTE                                                                                                                                                                                                             Patient Demographics:    Nicole Evans, is a 22 y.o. female, DOB - 06-May-1996, HAL:[id number]  Admit date - 09/01/2018   Admitting Physician [name], MD  Outpatient Primary MD for the patient is [name], MD  LOS - 1   Chief Complaint  Patient presents with  . Dizziness       Brief Narrative    22 y.o. female who denies any significant past medical history, now presenting to the emergency department for evaluation of numbness and tingling in all extremities, as well as generalized weakness and fatigue.  He was seen by neurology service, who recommended admission for further work-up   Subjective:    Nicole Evans was seen and examined this morning. Due to instruction as LP needs to be done under fluoroscopy could not be completed yesterday, pending for today. She is reporting that paresthesia comes and goes especially in her lower extremity, weakness persists but has not progressed above her knees only on one episode on the right. Pain is cramping in nature in the hands and feet. She was found mildly hypotensive respond to fluid resuscitation  She was not complaining of any headaches visual changes or acute progressive asymmetric or symmetric weaknesses this morning. Denied of having any persistent progression of paresthesia.    Assessment  & Plan :    Principal Problem:   Numbness and tingling of upper and lower extremities of both sides Active Problems:   Leukopenia   Hyponatremia   Vitamin D deficiency   Paresthesia and pain of extremity  Bilateral lower extremity and hand paresthesia/lower extremity weakness -Reporting progressive bilateral lower leg weakness, numbness in hands and legs. -Although her  symptoms does not follow any particular pathology or disease -She continues to complain of pain and weakness especially in her lower extremities, reporting that the paresthesia in lower extremities comes and goes but her hand are improving, only with some numbness and weakness  - Presents with 2 wks of numbness and tingling in all extremities,  -Neurology >>> following very closely, appreciate their input -MRI brain, C-spine, and lumbar spines  were obtained, no acute findings,  -MRI thoracic spine is completed within normal limits -No further imaging was commended, -ANA and RF/ ESR/ CRP tested - were all normal. -Neurology has recommended LP under fluoroscopy, CSF studies to be followed  -Anticipating LP under fluoroscopy today  -Neurology recommending trial of gabapentin   Headaches with a brief neck stiffness  -Per patient and nursing staff yesterday evening transient -Improved -Moderate to PRN analgesics  Episode of hypotension overnight -Likely exacerbated by pain medication -Responded to fluid resuscitation -Asymptomatic as far as hypotension this morning.   hyponatremia  -Stable - Serum potassium is 133 on admission, appears to be hypovolemic , corrected with IV fluids  Leukopenia  - WBC is 3,200 on admission, previously normal  - No fever or infectious sxs, autoimmune serologies recently negative  - Culture if febrile, repeat CBC >>> WBC 5.2, normal -HIV pending  Vit D deficiency  - Recently diagnosed by PCP and started on supplementation      Code Status : Full  Family Communication  : Parents at bedside  Disposition Plan: Inpatient now, planning for LP, further neurological evaluation today.   Home when stable   Consults  :  Neurology  Procedures  : none  DVT Prophylaxis  :  SCD  Lab Results  Component Value Date   PLT 311 09/03/2018    Antibiotics  :    Anti-infectives (From admission, onward)   None        Objective:   Vitals:    09/04/18 1329 09/04/18 2112 09/05/18 0549 09/05/18 1102  BP: (!) 106/58 (!) 105/59 (!) 94/51 109/67  Pulse: 78 72 76   Resp: 18     Temp: 98.6 F (37 C) 98.6 F (37 C) 98 F (36.7 C)   TempSrc:  Oral Oral   SpO2: 100% 100% 100%   Weight:      Height:        Wt Readings from Last 3 Encounters:  09/01/18 56.2 kg  05/04/18 55.8 kg  12/27/17 60.1 kg     Intake/Output Summary (Last 24 hours) at 09/05/2018 1239 Last data filed at 09/05/2018 1000 Gross per 24 hour  Intake 143.83 ml  Output 1 ml  Net 142.83 ml    BP 109/67 (BP Location: Left Arm)   Pulse 76   Temp 98 F (36.7 C) (Oral)   Resp 18   Ht '5\' 5"'  (1.651 m)   Wt 56.2 kg   LMP 07/26/2018   SpO2 100%   BMI 20.63 kg/m    Physical Exam  Constitution:  Alert, cooperative, no distress,  Psychiatric: Normal and stable mood and affect, cognition intact,   HEENT: Normocephalic, PERRL, otherwise with in Normal limits  Chest:Chest symmetric Cardio vascular:  S1/S2, RRR, No murmure, No Rubs or Gallops  pulmonary: Clear to auscultation bilaterally, respirations unlabored, negative wheezes / crackles Abdomen: Soft, non-tender, non-distended, bowel sounds,no masses, no organomegaly Muscular skeletal: Limited exam - in bed, able to move all 4 extremities, could not determine full strength of lower extremities. Active bilateral paresthesia, able to locate to touch, able to tell between hot and cold. Neuro: CNII-XII intact. , normal motor and sensation, reflexes intact objective hand and lower extremity paresthesia, weakness lower extremities below the knee Extremities: No pitting edema lower extremities, +2 pulses  Skin: Dry, warm to touch, negative for any Rashes, No open wounds Wounds: per nursing documentation       Data Review:    CBC Recent Labs  Lab 09/01/18  1141 09/02/18 0405 09/03/18 0340  WBC 3.2* 4.8 5.2  HGB 11.3* 10.8* 11.4*  HCT 35.7* 33.1* 36.3  PLT 325 320 311  MCV 90.4 89.5 89.2  MCH 28.6 29.2  28.0  MCHC 31.7 32.6 31.4  RDW 13.7 13.5 13.5  LYMPHSABS 1.3 2.3  --   MONOABS 0.3 0.5  --   EOSABS 0.1 0.1  --   BASOSABS 0.0 0.0  --     Chemistries  Recent Labs  Lab 09/01/18 1141 09/02/18 0405 09/03/18 0340  NA 133* 138 138  K 3.8 3.2* 4.0  CL 106 107 109  CO2 22 24 21*  GLUCOSE 93 126* 95  BUN 21* 12 13  CREATININE 0.64 0.80 0.83  CALCIUM 8.8*  9.4 8.5* 9.1  AST 15  --   --   ALT 21  --   --   ALKPHOS 72  --   --   BILITOT 1.0  --   --    ------------------------------------------------------------------------------------------------------------------ No results for input(s): CHOL, HDL, LDLCALC, TRIG, CHOLHDL, LDLDIRECT in the last 72 hours.  Lab Results  Component Value Date   HGBA1C 5.5 05/04/2018   ------------------------------------------------------------------------------------------------------------------ Recent Labs    09/03/18 1133  TSH 0.483   ------------------------------------------------------------------------------------------------------------------- No results found for: BNP  Inpatient Medications  Scheduled Meds: . lidocaine (PF)  5 mL Intradermal Once  . sodium chloride flush  3 mL Intravenous Q12H   Continuous Infusions: PRN Meds:.acetaminophen **OR** acetaminophen, HYDROcodone-acetaminophen, ondansetron **OR** ondansetron (ZOFRAN) IV, senna-docusate  Micro Results No results found for this or any previous visit (from the past 240 hour(s)).  Radiology Reports Dg Lumbar Spine Complete  Result Date: 09/01/2018 CLINICAL DATA:  Low back pain for the past 2 weeks. Bilateral lower extremity radicular symptoms. EXAM: LUMBAR SPINE - COMPLETE 4+ VIEW COMPARISON:  None in PACs FINDINGS: The L1 transverse processes are assumed to be transitional. The lumbar vertebral bodies are preserved in height. The disc space heights are well maintained. There is no spondylolisthesis. The pedicles and transverse processes are intact. The IUD is in  stable position. IMPRESSION: There is no acute or significant chronic bony abnormality of the lumbar spine. Electronically Signed   By: David  Martinique M.D.   On: 09/01/2018 12:18   Ct Head Wo Contrast  Result Date: 09/01/2018 CLINICAL DATA:  Ataxia headache numbness in the lower extremity EXAM: CT HEAD WITHOUT CONTRAST TECHNIQUE: Contiguous axial images were obtained from the base of the skull through the vertex without intravenous contrast. COMPARISON:  None. FINDINGS: Brain: No evidence of acute infarction, hemorrhage, hydrocephalus, extra-axial collection or mass lesion/mass effect. Vascular: No hyperdense vessel or unexpected calcification. Skull: Normal. Negative for fracture or focal lesion. Sinuses/Orbits: No acute finding. Other: None IMPRESSION: Negative non contrasted CT appearance of the brain Electronically Signed   By: Donavan Foil M.D.   On: 09/01/2018 18:08   Mr Jeri Cos GE Contrast  Result Date: 09/02/2018 CLINICAL DATA:  Numbness and tingling, paresthesias hands and legs. EXAM: MRI HEAD WITHOUT AND WITH CONTRAST TECHNIQUE: Multiplanar, multiecho pulse sequences of the brain and surrounding structures were obtained without and with intravenous contrast. CONTRAST:  5 mL Gadovist IV COMPARISON:  CT head 09/01/2018 FINDINGS: Brain: No acute infarction, hemorrhage, hydrocephalus, extra-axial collection or mass lesion. Negative for demyelinating disease. Normal enhancement postcontrast administration. Vascular: Normal arterial flow void Skull and upper cervical spine: Negative Sinuses/Orbits: Mild mucosal edema paranasal sinuses.  Normal orbit Other: None IMPRESSION: Normal MRI head with contrast. Electronically Signed   By: Juanda Crumble  Carlis Abbott M.D.   On: 09/02/2018 08:24   Mr Cervical Spine W Wo Contrast  Result Date: 09/02/2018 CLINICAL DATA:  Numbness and tingling hands and legs.  Paresthesias. EXAM: MRI CERVICAL SPINE WITHOUT AND WITH CONTRAST TECHNIQUE: Multiplanar and multiecho pulse  sequences of the cervical spine, to include the craniocervical junction and cervicothoracic junction, were obtained without and with intravenous contrast. CONTRAST:  5 mL Gadovist IV COMPARISON:  None. FINDINGS: Alignment: Normal Vertebrae: Negative for fracture or mass. Small hemangioma C7 vertebral body Cord: Normal signal and morphology Posterior Fossa, vertebral arteries, paraspinal tissues: Negative Disc levels: Negative for disc protrusion or spinal stenosis. 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 Normal enhancement postcontrast administration. IMPRESSION: Normal appearing cervical spinal cord. Negative for significant degenerative change or neural impingement in the cervical spine. Electronically Signed   By: Franchot Gallo M.D.   On: 09/02/2018 07:51   Mr Thoracic Spine W Wo Contrast  Result Date: 09/02/2018 CLINICAL DATA:  Acute/progressive myelopathy. Numbness and tingling of the hands and legs. EXAM: MRI THORACIC WITHOUT AND WITH CONTRAST TECHNIQUE: Multiplanar and multiecho pulse sequences of the thoracic spine were obtained without and with intravenous contrast. CONTRAST:  4 cc Gadavist COMPARISON:  Cervical and lumbar examinations earlier today. FINDINGS: MRI THORACIC SPINE FINDINGS Alignment:  Normal Vertebrae: Normal Cord:  Normal.  No focal cord lesion.  No abnormal enhancement. Paraspinal and other soft tissues: Normal Disc levels: All disc levels appear normal. No degeneration, bulge or herniation. No facet arthropathy. No canal or foraminal stenosis. IMPRESSION: Normal thoracic examination. No abnormality seen to explain the presenting symptoms. Electronically Signed   By: Nelson Chimes M.D.   On: 09/02/2018 18:59   Mr Lumbar Spine W Wo Contrast  Result Date: 09/02/2018 CLINICAL DATA:  Two week history of tingling and numbness hands and legs. Paresthesia. Back pain EXAM: MRI LUMBAR SPINE WITHOUT AND WITH  CONTRAST TECHNIQUE: Multiplanar and multiecho pulse sequences of the lumbar spine were obtained without and with intravenous contrast. CONTRAST:  5 mL Gadovist IV COMPARISON:  Lumbar radiographs 09/01/2018 FINDINGS: Segmentation:  Normal Alignment:  Normal Vertebrae:  Normal bone marrow.  Negative for fracture or mass. Conus medullaris and cauda equina: Conus extends to the L2 level. Conus and cauda equina appear normal. Paraspinal and other soft tissues: Negative for paraspinous mass or fluid collection. Large amount of stool in the rectum. Disc levels: Normal disc spaces.  Negative for disc protrusion or stenosis. IMPRESSION: Normal MRI lumbar spine with contrast Large amount of stool in the rectum. Electronically Signed   By: Franchot Gallo M.D.   On: 09/02/2018 08:27     Deatra James M.D on 09/05/2018 at 12:39 PM  Between 7am to 7pm - Pager - [phone]  After 7pm go to [email or web address] - password [email or web address]  [facility] -  Office  [phone]

## 2018-09-05 NOTE — Procedures (Signed)
Procedure: LP w fluoro guidance. Specimen: clear CSF, to lab Bleeding: minimal. Complications: None immediate. Patient   -Condition: Stable.  -Disposition:  Return to inpt floor.  Full Radiology report to follow under IMAGING 

## 2018-09-07 LAB — IGG CSF INDEX
Albumin CSF-mCnc: 8 mg/dL — ABNORMAL LOW (ref 11–48)
Albumin: 3.9 g/dL (ref 3.5–5.5)
CSF IgG Index: 0.4 (ref 0.0–0.7)
IgG (Immunoglobin G), Serum: 1427 mg/dL (ref 700–1600)
IgG, CSF: 1.3 mg/dL (ref 0.0–8.6)
IgG/Alb Ratio, CSF: 0.16 (ref 0.00–0.25)

## 2018-09-07 LAB — MYELIN BASIC PROTEIN, CSF: Myelin Basic Protein: 0.9 ng/mL (ref 0.0–2.9)

## 2018-09-08 LAB — OLIGOCLONAL BANDS, CSF + SERM

## 2018-09-09 LAB — CSF CULTURE W GRAM STAIN: Culture: NO GROWTH

## 2018-09-21 ENCOUNTER — Inpatient Hospital Stay: Payer: Medicaid Other

## 2018-09-21 ENCOUNTER — Inpatient Hospital Stay
Admission: EM | Admit: 2018-09-21 | Discharge: 2018-09-23 | DRG: 861 | Disposition: A | Discharge status: Home Health | Payer: Medicaid Other | Attending: Internal Medicine | Admitting: Internal Medicine

## 2018-09-21 DIAGNOSIS — R29898 Other symptoms and signs involving the musculoskeletal system: Secondary | ICD-10-CM

## 2018-09-21 DIAGNOSIS — R200 Anesthesia of skin: Secondary | ICD-10-CM | POA: Diagnosis present

## 2018-09-21 DIAGNOSIS — R202 Paresthesia of skin: Secondary | ICD-10-CM

## 2018-09-21 DIAGNOSIS — R000 Tachycardia, unspecified: Secondary | ICD-10-CM | POA: Diagnosis present

## 2018-09-21 DIAGNOSIS — R296 Repeated falls: Secondary | ICD-10-CM | POA: Diagnosis present

## 2018-09-21 DIAGNOSIS — R2681 Unsteadiness on feet: Secondary | ICD-10-CM | POA: Diagnosis present

## 2018-09-21 DIAGNOSIS — R531 Weakness: Principal | ICD-10-CM | POA: Diagnosis present

## 2018-09-21 LAB — CBC AND DIFFERENTIAL
Absolute NRBC: 0 10*3/uL (ref 0.00–0.00)
Basophils Absolute Automated: 0.02 10*3/uL (ref 0.00–0.08)
Basophils Automated: 0.4 %
Eosinophils Absolute Automated: 0.11 10*3/uL (ref 0.00–0.44)
Eosinophils Automated: 2.2 %
Hematocrit: 40 % (ref 34.7–43.7)
Hgb: 13 g/dL (ref 11.4–14.8)
Immature Granulocytes Absolute: 0.01 10*3/uL (ref 0.00–0.07)
Immature Granulocytes: 0.2 %
Lymphocytes Absolute Automated: 2.68 10*3/uL (ref 0.42–3.22)
Lymphocytes Automated: 54.6 %
MCH: 29.4 pg (ref 25.1–33.5)
MCHC: 32.5 g/dL (ref 31.5–35.8)
MCV: 90.5 fL (ref 78.0–96.0)
MPV: 10.2 fL (ref 8.9–12.5)
Monocytes Absolute Automated: 0.38 10*3/uL (ref 0.21–0.85)
Monocytes: 7.7 %
Neutrophils Absolute: 1.71 10*3/uL (ref 1.10–6.33)
Neutrophils: 34.9 %
Nucleated RBC: 0 /100 WBC (ref 0.0–0.0)
Platelets: 339 10*3/uL (ref 142–346)
RBC: 4.42 10*6/uL (ref 3.90–5.10)
RDW: 13 % (ref 11–15)
WBC: 4.91 10*3/uL (ref 3.10–9.50)

## 2018-09-21 LAB — URINALYSIS, REFLEX TO MICROSCOPIC EXAM IF INDICATED
Bilirubin, UA: NEGATIVE
Blood, UA: NEGATIVE
Glucose, UA: NEGATIVE
Ketones UA: NEGATIVE
Leukocyte Esterase, UA: NEGATIVE
Nitrite, UA: NEGATIVE
Protein, UR: NEGATIVE
Specific Gravity UA: 1.024 (ref 1.001–1.035)
Urine pH: 6 (ref 5.0–8.0)
Urobilinogen, UA: NORMAL mg/dL (ref 0.2–2.0)

## 2018-09-21 LAB — COMPREHENSIVE METABOLIC PANEL
ALT: 17 U/L (ref 0–55)
AST (SGOT): 15 U/L (ref 5–34)
Albumin/Globulin Ratio: 1.3 (ref 0.9–2.2)
Albumin: 4.6 g/dL (ref 3.5–5.0)
Alkaline Phosphatase: 95 U/L (ref 37–106)
BUN: 17 mg/dL (ref 7.0–19.0)
Bilirubin, Total: 1.1 mg/dL (ref 0.2–1.2)
CO2: 22 mEq/L (ref 22–29)
Calcium: 9.4 mg/dL (ref 8.5–10.5)
Chloride: 105 mEq/L (ref 100–111)
Creatinine: 0.8 mg/dL (ref 0.6–1.0)
Globulin: 3.6 g/dL (ref 2.0–3.6)
Glucose: 71 mg/dL (ref 70–100)
Potassium: 4 mEq/L (ref 3.5–5.1)
Protein, Total: 8.2 g/dL (ref 6.0–8.3)
Sodium: 136 mEq/L (ref 136–145)

## 2018-09-21 LAB — URINALYSIS REFLEX TO MICROSCOPIC EXAM - REFLEX TO CULTURE
Bilirubin, UA: NEGATIVE
Glucose, UA: NEGATIVE
Ketones UA: NEGATIVE
Leukocyte Esterase, UA: NEGATIVE
Nitrite, UA: NEGATIVE
Protein, UR: NEGATIVE
Specific Gravity UA: 1.027 (ref 1.001–1.035)
Urine pH: 5 (ref 5.0–8.0)
Urobilinogen, UA: NORMAL mg/dL (ref 0.2–2.0)

## 2018-09-21 LAB — GFR: EGFR: >60

## 2018-09-21 LAB — POCT PREGNANCY TEST, URINE HCG: POCT Pregnancy HCG Test, UR: NEGATIVE

## 2018-09-21 LAB — LIPASE: Lipase: 29 U/L (ref 8–78)

## 2018-09-21 MED ORDER — LORAZEPAM 2 MG/ML IJ SOLN
1.00 mg | Freq: Once | INTRAMUSCULAR | Status: AC
Start: 2018-09-21 — End: 2018-09-21
  Administered 2018-09-21: 23:00:00 1 mg via INTRAVENOUS
  Filled 2018-09-21: qty 1

## 2018-09-21 MED ORDER — ACETAMINOPHEN 650 MG RE SUPP
650.00 mg | RECTAL | Status: DC | PRN
Start: 2018-09-21 — End: 2018-09-23

## 2018-09-21 MED ORDER — ENOXAPARIN SODIUM 40 MG/0.4ML SC SOLN
40.00 mg | Freq: Every day | SUBCUTANEOUS | Status: DC
Start: 2018-09-22 — End: 2018-09-23
  Administered 2018-09-22 – 2018-09-23 (×2): 40 mg via SUBCUTANEOUS
  Filled 2018-09-21 (×2): qty 0.4

## 2018-09-21 MED ORDER — GADOBUTROL 1 MMOL/ML IV SOLN
5.60 mL | Freq: Once | INTRAVENOUS | Status: AC | PRN
Start: 2018-09-21 — End: 2018-09-21
  Administered 2018-09-21: 5.6 mmol via INTRAVENOUS

## 2018-09-21 MED ORDER — NALOXONE HCL 0.4 MG/ML IJ SOLN (WRAP)
0.20 mg | INTRAMUSCULAR | Status: DC | PRN
Start: 2018-09-21 — End: 2018-09-23

## 2018-09-21 MED ORDER — ACETAMINOPHEN 325 MG PO TABS
650.0000 mg | ORAL_TABLET | ORAL | Status: DC | PRN
Start: 2018-09-21 — End: 2018-09-23
  Administered 2018-09-22: 650 mg via ORAL
  Filled 2018-09-21: qty 2

## 2018-09-21 NOTE — ED Notes (Signed)
NURSING NOTE FOR THE RECEIVING INPATIENT NURSE     ED [place], [place]   [facility] [id number]   ED CHARGE NURSE [phone]   ADMISSION INFORMATION   Alexis Gentry is a 23 y.o. female admitted with a diagnosis of:    1. Weakness of both lower extremities       Isolation:  None  Place of residence / Living situation:  Home/Family Care   NURSING CARE   Mental Status alert and oriented   ADL ADLs:            Needs assistance with ADLs  Ambulation:  Ambulates with: walker   Pertinent Information  and Safety Concerns Pt has new onset generalized weakness. States ambulates with walker at home. AOx4. Pt will have IV placed before admittance.     VITAL SIGNS     Time of Last Set of Vitals 1900   Temperature 98.74F   BP 119/56   Pulse 94   Respirations 16   Pulse OX 100%ra        IV LINES           LAB RESULTS     Labs Reviewed   URINALYSIS REFLEX TO MICROSCOPIC EXAM - REFLEX TO CULTURE - Abnormal; Notable for the following components:       Result Value    Blood, UA Small (*)     All other components within normal limits    Narrative:     Replace urinary catheter prior to obtaining the urine culture  if it has been in place for greater than or equal to 14  days:->N/A No Foley  Indications for U/A Reflex to Micro - Reflex to  Culture:->Suprapubic Pain/Tenderness or Dysuria   CBC AND DIFFERENTIAL    Narrative:     Replace urinary catheter prior to obtaining the urine culture  if it has been in place for greater than or equal to 14  days:->N/A No Foley  Indications for U/A Reflex to Micro - Reflex to  Culture:->Suprapubic Pain/Tenderness or Dysuria   COMPREHENSIVE METABOLIC PANEL    Narrative:     Replace urinary catheter prior to obtaining the urine culture  if it has been in place for greater than or equal to 14  days:->N/A No Foley  Indications for U/A Reflex to Micro - Reflex to  Culture:->Suprapubic Pain/Tenderness or Dysuria   LIPASE    Narrative:     Replace urinary catheter prior to obtaining the urine culture  if it has  been in place for greater than or equal to 14  days:->N/A No Foley  Indications for U/A Reflex to Micro - Reflex to  Culture:->Suprapubic Pain/Tenderness or Dysuria   GFR    Narrative:     Replace urinary catheter prior to obtaining the urine culture  if it has been in place for greater than or equal to 14  days:->N/A No Foley  Indications for U/A Reflex to Micro - Reflex to  Culture:->Suprapubic Pain/Tenderness or Dysuria   POCT PREGNANCY TEST, URINE HCG        PATIENT BELONGINGS   With patient

## 2018-09-21 NOTE — H&P (Addendum)
ADMISSION HISTORY AND PHYSICAL EXAM    Date Time: 09/21/18 8:20 PM  Patient Name: Alexis Gentry  Attending Physician: Merrilee Seashore, MD  Primary Care Physician: Tana Coast, MD    CC: LE weakness      Assessment:   23 year old female with no significant past medical history presented to the hospital with chief complaint of 3 weeks gradually and slowly worsening lower extremity weakness, numbness and 2 episodes of urine incontinency.    Plan:   #Acute/subacute bilateral lower extremity weakness  Started 3 weeks ago, partial work-ups back in [place] including brain and spine MRI, LP unremarkable.  Gradually worsening symptom, significant ambulatory dysfunction, twice urinary incontinency, frequent falls.rigidity noted on exam.  Positive family history although its vague and unclear what was the exact diagnosis for her mom.  Less likely to be Guillain-Barr given her weakness did not extend to her upper extremity also had a negative LP with normal protein, cannot rule it out completely.  Possibility of idiopathic versus inflammatory transverse myelitis although she does not have flaccid paresis.    -Neurology was consulted from ED, will see patient tomorrow.  -Follow-up spinal cord MRI, EMG  -Appreciate neurology recommendation for further imaging and work-ups  -Follow-up ESR/CRP, vitamin B12, folate, syphilis serology.  -PT and OT    Patient is full code  Fall precautions  Regular diet  DVT P PX: Lovenox  Disposition: (Please see PAF column for Expected D/C Date)   Today's date: 09/21/2018   Admit Date: 09/21/2018  5:35 PM  Service status: Observation  Clinical Milestones: Subacute lower extremity weakness  Anticipated discharge needs: Pending neurology consult and neurology's work-up    History of Presenting Illness:   Alexis Gentry is a 23 y.o. female with no significant past medical history who presented to the hospital due to a fall today while she was using walker for walking.    Patient problem  started on December 18 while she was in [place] at her work and she had a fall with lower extremity weakness, she was admitted to the hospital in [place] from 21st-20 4 December with work-ups including brain and spine MRI, LP was done.  Patient's mother has the report of MRI and LP on her cell phone which are unremarkable.  Patient came here to [place] and stayed with her mom given she was not able to ambulate independently, as per her mother her symptom gradually worsened, patient reports that she had 2 episodes of urine incontinency but no fecal incontinency, patient reports that she did not feel her urine is coming out.  She has been seen by her PCP recommended to see a neurologist as an outpatient and go through physical therapy which did not work, she came today because she had another fall.    Mom reports she had a viral URI around Thanksgiving time but no more recent viral illness recently.    Of note patient's mom reports that she had the same problem at the same age when she was young and she underwent massive work-ups, still following with [facility] clinic, no significant reason was found she has been given several courses of steroid treatment in the past, patient reports that her symptom resolved after her second pregnancy.      Past Medical History:   History reviewed. No pertinent past medical history.    Available old records reviewed, including: Patient documents and chart review    Past Surgical History:     Past Surgical History:  Procedure Laterality Date    APPENDECTOMY      CHOLECYSTECTOMY      WISDOM TOOTH EXTRACTION         Family History:   Positive for her mother having same symptoms with bilateral lower extremity weakness at young age.    Social History:     Social History     Tobacco Use   Smoking Status Never Smoker   Smokeless Tobacco Former Neurosurgeon     Social History     Substance and Sexual Activity   Alcohol Use No     Social History     Substance and Sexual Activity    Drug Use No       Allergies:     Allergies   Allergen Reactions    Penicillins Shortness Of Breath and Rash       Medications:     Home Medications     Med List Status:  In Progress Set By: Pia Mau, RN at 09/21/2018  6:09 PM                levonorgestrel-ethinyl estradiol (AVIANE,ALESSE,LESSINA) 0.1-20 MG-MCG per tablet     Take 1 tablet by mouth daily.     Multiple Vitamins-Minerals (MULTIVITAMIN WITH MINERALS) tablet     Take 1 tablet by mouth daily            Method by which medications were confirmed on admission: Patient and family report, patient has an IUD for preventing pregnancy    Review of Systems:   All other systems were reviewed and are negative except: HPI    Physical Exam:     Patient Vitals for the past 24 hrs:   BP Temp Pulse Resp SpO2 Height Weight   09/21/18 1438 119/56 98.6 F (37 C) 94 20 100 % 1.651 m (5\' 5" ) 56.2 kg (124 lb)   09/21/18 1421   95 16 97 %       Body mass index is 20.63 kg/m.  No intake or output data in the 24 hours ending 09/21/18 2020    General: awake, alert, oriented x 3; no acute distress.  HEENT: perrla, eomi, sclera anicteric  oropharynx clear without lesions, mucous membranes moist  Neck: supple, no lymphadenopathy, no thyromegaly, no JVD, no carotid bruits  Cardiovascular: regular rate and rhythm, no murmurs, rubs or gallops  Lungs: clear to auscultation bilaterally, without wheezing, rhonchi, or rales  Abdomen: soft, non-tender, non-distended; no palpable masses, no hepatosplenomegaly, normoactive bowel sounds, no rebound or guarding  Extremities: no clubbing, cyanosis, or edema  Neuro: cranial nerves grossly intact, strength 5/5 in upper and 3/5 in distal and 4/5 in proximal lower extremities, sensation diminished below the knee, reflexes are exaggerated 2-3+ in lower extremities  Normal reflexes upper extremities.  Skin: no rashes or lesions noted        Labs:     Results     Procedure Component Value Units Date/Time    Comprehensive metabolic  panel [[id number]] Collected:  09/21/18 1558    Specimen:  Blood Updated:  09/21/18 1634     Glucose 71 mg/dL      BUN 54.0 mg/dL      Creatinine 0.8 mg/dL      Sodium 981 mEq/L      Potassium 4.0 mEq/L      Chloride 105 mEq/L      CO2 22 mEq/L      Calcium 9.4 mg/dL      Protein, Total 8.2 g/dL  Albumin 4.6 g/dL      AST (SGOT) 15 U/L      ALT 17 U/L      Alkaline Phosphatase 95 U/L      Bilirubin, Total 1.1 mg/dL      Globulin 3.6 g/dL      Albumin/Globulin Ratio 1.3    Narrative:       Replace urinary catheter prior to obtaining the urine culture  if it has been in place for greater than or equal to 14  days:->N/A No Foley  Indications for U/A Reflex to Micro - Reflex to  Culture:->Suprapubic Pain/Tenderness or Dysuria    Lipase [[id number]] Collected:  09/21/18 1558    Specimen:  Blood Updated:  09/21/18 1634     Lipase 29 U/L     Narrative:       Replace urinary catheter prior to obtaining the urine culture  if it has been in place for greater than or equal to 14  days:->N/A No Foley  Indications for U/A Reflex to Micro - Reflex to  Culture:->Suprapubic Pain/Tenderness or Dysuria    GFR [[id number]] Collected:  09/21/18 1558     Updated:  09/21/18 1634     EGFR >60.0    Narrative:       Replace urinary catheter prior to obtaining the urine culture  if it has been in place for greater than or equal to 14  days:->N/A No Foley  Indications for U/A Reflex to Micro - Reflex to  Culture:->Suprapubic Pain/Tenderness or Dysuria    UA Reflex to Micro - Reflex to Culture [[id number]]  (Abnormal) Collected:  09/21/18 1558     Updated:  09/21/18 1622     Urine Type Urine, Clean Ca     Color, UA Yellow     Clarity, UA Clear     Specific Gravity UA 1.027     Urine pH 5.0     Leukocyte Esterase, UA Negative     Nitrite, UA Negative     Protein, UR Negative     Glucose, UA Negative     Ketones UA Negative     Urobilinogen, UA Normal mg/dL      Bilirubin, UA Negative     Blood, UA Small     RBC, UA 3 - 5 /hpf      WBC, UA 0 - 5  /hpf      Squamous Epithelial Cells, Urine 0 - 5 /hpf     Narrative:       Replace urinary catheter prior to obtaining the urine culture  if it has been in place for greater than or equal to 14  days:->N/A No Foley  Indications for U/A Reflex to Micro - Reflex to  Culture:->Suprapubic Pain/Tenderness or Dysuria    CBC and differential [[id number]] Collected:  09/21/18 1558    Specimen:  Blood Updated:  09/21/18 1619     WBC 4.91 x10 3/uL      Hgb 13.0 g/dL      Hematocrit 69.6 %      Platelets 339 x10 3/uL      RBC 4.42 x10 6/uL      MCV 90.5 fL      MCH 29.4 pg      MCHC 32.5 g/dL      RDW 13 %      MPV 10.2 fL      Neutrophils 34.9 %      Lymphocytes Automated 54.6 %      Monocytes 7.7 %  Eosinophils Automated 2.2 %      Basophils Automated 0.4 %      Immature Granulocyte 0.2 %      Nucleated RBC 0.0 /100 WBC      Neutrophils Absolute 1.71 x10 3/uL      Abs Lymph Automated 2.68 x10 3/uL      Abs Mono Automated 0.38 x10 3/uL      Abs Eos Automated 0.11 x10 3/uL      Absolute Baso Automated 0.02 x10 3/uL      Absolute Immature Granulocyte 0.01 x10 3/uL      Absolute NRBC 0.00 x10 3/uL     Narrative:       Replace urinary catheter prior to obtaining the urine culture  if it has been in place for greater than or equal to 14  days:->N/A No Foley  Indications for U/A Reflex to Micro - Reflex to  Culture:->Suprapubic Pain/Tenderness or Dysuria    Urine HCG, POC/ Qualitative [[id number]] Collected:  09/21/18 1556    Specimen:  Urine Updated:  09/21/18 1602     POCT QC Pass     POCT Pregnancy HCG Test, UR Negative     Comment: Negative Value is Normal in Healthy Males or Healthy non-pregnant Females          Imaging personally reviewed, including: No results found.      Safety Checklist  DVT prophylaxis:  CHEST guideline (See page [id number]) Chemical   Foley:  [place] Rn Foley protocol Not present   IVs:  Peripheral IV   PT/OT: Not needed   Daily CBC & or Chem ordered:  SHM/ABIM guidelines (see #5) Yes, due to clinical and lab  instability   Reference for approximate charges of common labs: CBC auto diff - $76   BMP - $99   Mg - $79    Signed by: Zigmund Gottron, MD   WU:[id number], Suzette Battiest, MD

## 2018-09-21 NOTE — ED Notes (Signed)
Patient expresses desire to leave and have IV removed. Educated on need to place another if any future orders/recommendations per MD. Patient/family verbalizes understanding. IV removed per request.

## 2018-09-21 NOTE — ED Notes (Signed)
ED Nursing Note For The Receiving Inpatient Nurse - UPDATE     The following information is an update to the previous ED Nursing Admission Note:  MRI checklist completed. 20g IV placed in Right AC. Pt remains in no acute distress. VSS.    VITAL SIGNS     Time of Last Set of Vitals 2100   Temperature 98.19F   BP 124/58   Pulse 88   Respirations 16   Pulse OX 100%RA

## 2018-09-21 NOTE — ED Provider Notes (Signed)
[facility] EMERGENCY DEPARTMENT H&P      Visit date: 09/21/2018      CLINICAL SUMMARY           Diagnosis:    .     Final diagnoses:   Weakness of both lower extremities         MDM Notes:    Alexis Gentry is a 23 y.o. female with PMHx appendectomy and cholecystectomy who presents with fall this AM in setting of BLE tingling and weakness for the last 3 weeks. Pt was also having multiple falls over the last 3 weeks. VSS. Exam as below. Significant for subjective decreased sensation BLE R>L below the knee region. Normal sensation in promixal lower extremities and res of exam. 3/5 strength in BLE right worse than left, 5/5 strength upper extremities. Normal reflexes in lower extremities. No saddle anesthesia. No midline spinal tenderness. No meningismus. Unsteady gait. Had extensive work up in [place] and records reviewed on patient and mom's phone. MRI brain, c/t/l spine w and w/o contrast normal. LP was unremarkable, no protein elevated, no infection. Exam is reportedly relative stable over the past 3 weeks. Unlikely Guillain Barre, epidural abscess or masses, meningismus. Possible myelitis. Labs unremarkable. Discussed case with neurology who recommends admission for further work up and possible EMG. Discussed case with CNS hospitalist who agrees with work up, plan and admission.               Disposition:         Inpatient Admit      ED Disposition     ED Disposition Condition Date/Time Comment    Admit  Thu Sep 21, 2018  7:39 PM Admitting Physician: Zigmund Gottron [[id number]]   Diagnosis: Lower extremity weakness [660180]   Estimated Length of Stay: > or = to 2 midnights   Tentative Discharge Plan?: Home or Self Care [1]   Patient Class: Inpatient [101]           CASE ACUITY SUMMARY                        CLINICAL INFORMATION        HPI:      Chief Complaint: Generalized weakness and Fall  .    Alexis Gentry is a 23 y.o. female with PMHx appendectomy and cholecystectomy who presents with  fall this AM in setting of BLE tingling and weakness for the last 3 weeks. Pt was also having multiple falls over the last 3 weeks. Pt was seen at a hospital in  09/01/18 - 09/05/18 in [place] for these falls and weakness, had MRI brain/spine, CT, XRs, and LP which were all unremarkable. Pt was then [facility] with recommendation for neurologist and PCP f/u. Pt seen by PCP 09/12/18 and told to come to ED if sxs worsen or she fell again. Pt reports intermittent back pain, no current back pain. Pt fell this AM prompting visit to ED today.     Denies head injury, LOC, neck pain, fever, HA, nausea, vomiting, diarrhea, CP, SOB.    History obtained from: Patient          ROS:      Positive and negative ROS elements as per HPI.  All other systems reviewed and negative.      Physical Exam:      Pulse 95   BP 119/56   Resp 16   SpO2 97 %   Temp 98.6 F (37 C)  Physical Exam  Vitals signs and nursing note reviewed.   Constitutional:       Appearance: Normal appearance. She is well-developed.   HENT:      Head: Normocephalic and atraumatic.      Nose: Nose normal.      Mouth/Throat:      Mouth: Mucous membranes are moist.      Pharynx: Uvula midline.   Eyes:      Conjunctiva/sclera: Conjunctivae normal.      Pupils: Pupils are equal, round, and reactive to light.   Neck:      Musculoskeletal: Full passive range of motion without pain, normal range of motion and neck supple.      Trachea: Trachea and phonation normal.   Cardiovascular:      Rate and Rhythm: Normal rate and regular rhythm.      Pulses: Normal pulses.   Pulmonary:      Effort: Pulmonary effort is normal. No respiratory distress.      Breath sounds: Normal breath sounds. No stridor. No wheezing or rhonchi.   Abdominal:      General: Bowel sounds are normal. There is no distension.      Palpations: Abdomen is soft. There is no mass.      Tenderness: There is no abdominal tenderness. There is no guarding or rebound.   Musculoskeletal: Normal range of motion.   Skin:      General: Skin is warm and dry.      Capillary Refill: Capillary refill takes less than 2 seconds.   Neurological:      General: No focal deficit present.      Mental Status: She is alert and oriented to person, place, and time.      Cranial Nerves: No cranial nerve deficit or dysarthria.      Coordination: Coordination normal.      Comments: Subjective decreased sensation BLE R>L below the knee region. Normal sensation in promixal lower extremities and in arms and face. 3/5 strength in BLE right worse than left, 5/5 strength upper extremities. Normal reflexes in lower extremities. No saddle anesthesia. No midline spinal tenderness. No meningismus. Unsteady gait   Psychiatric:         Behavior: Behavior normal.                  PAST HISTORY        Primary Care Provider: Tana Coast, MD        PMH/PSH:    .     History reviewed. No pertinent past medical history.    She has a past surgical history that includes Cholecystectomy; Appendectomy; and Wisdom tooth extraction.      Social/Family History:      She reports that she has never smoked. She has quit using smokeless tobacco. She reports that she does not drink alcohol or use drugs.    History reviewed. No pertinent family history.      Listed Medications on Arrival:    .     Home Medications     Med List Status:  In Progress Set By: Pia Mau, RN at 09/21/2018  6:09 PM                levonorgestrel-ethinyl estradiol (AVIANE,ALESSE,LESSINA) 0.1-20 MG-MCG per tablet     Take 1 tablet by mouth daily.     Multiple Vitamins-Minerals (MULTIVITAMIN WITH MINERALS) tablet     Take 1 tablet by mouth daily  Allergies: She is allergic to penicillins.            VISIT INFORMATION        Clinical Course in the ED:          ED Course as of Sep 21 1938   Thu Sep 21, 2018   [id number] Spoke to neurology Dr. De Hollingshead, recommends admission for further workup.    [AJ]      ED Course User Index  [AJ] Larwance Rote         Medications Given in the ED:    .     ED  Medication Orders (From admission, onward)    None            Procedures:      Procedures      Interpretations:      O2 sat-           saturation: 97 %; Oxygen use: room air; Interpretation: Normal      Monitor -         interpreted by me: normal sinus in 90s.                     RESULTS        Lab Results:      Results     Procedure Component Value Units Date/Time    Comprehensive metabolic panel [[id number]] Collected:  09/21/18 1558    Specimen:  Blood Updated:  09/21/18 1634     Glucose 71 mg/dL      BUN 40.1 mg/dL      Creatinine 0.8 mg/dL      Sodium 027 mEq/L      Potassium 4.0 mEq/L      Chloride 105 mEq/L      CO2 22 mEq/L      Calcium 9.4 mg/dL      Protein, Total 8.2 g/dL      Albumin 4.6 g/dL      AST (SGOT) 15 U/L      ALT 17 U/L      Alkaline Phosphatase 95 U/L      Bilirubin, Total 1.1 mg/dL      Globulin 3.6 g/dL      Albumin/Globulin Ratio 1.3    Narrative:       Replace urinary catheter prior to obtaining the urine culture  if it has been in place for greater than or equal to 14  days:->N/A No Foley  Indications for U/A Reflex to Micro - Reflex to  Culture:->Suprapubic Pain/Tenderness or Dysuria    Lipase [[id number]] Collected:  09/21/18 1558    Specimen:  Blood Updated:  09/21/18 1634     Lipase 29 U/L     Narrative:       Replace urinary catheter prior to obtaining the urine culture  if it has been in place for greater than or equal to 14  days:->N/A No Foley  Indications for U/A Reflex to Micro - Reflex to  Culture:->Suprapubic Pain/Tenderness or Dysuria    GFR [[id number]] Collected:  09/21/18 1558     Updated:  09/21/18 1634     EGFR >60.0    Narrative:       Replace urinary catheter prior to obtaining the urine culture  if it has been in place for greater than or equal to 14  days:->N/A No Foley  Indications for U/A Reflex to Micro - Reflex to  Culture:->Suprapubic Pain/Tenderness or Dysuria    UA Reflex to Micro - Reflex  to Culture [[id number]]  (Abnormal) Collected:  09/21/18 1558     Updated:   09/21/18 1622     Urine Type Urine, Clean Ca     Color, UA Yellow     Clarity, UA Clear     Specific Gravity UA 1.027     Urine pH 5.0     Leukocyte Esterase, UA Negative     Nitrite, UA Negative     Protein, UR Negative     Glucose, UA Negative     Ketones UA Negative     Urobilinogen, UA Normal mg/dL      Bilirubin, UA Negative     Blood, UA Small     RBC, UA 3 - 5 /hpf      WBC, UA 0 - 5 /hpf      Squamous Epithelial Cells, Urine 0 - 5 /hpf     Narrative:       Replace urinary catheter prior to obtaining the urine culture  if it has been in place for greater than or equal to 14  days:->N/A No Foley  Indications for U/A Reflex to Micro - Reflex to  Culture:->Suprapubic Pain/Tenderness or Dysuria    CBC and differential [[id number]] Collected:  09/21/18 1558    Specimen:  Blood Updated:  09/21/18 1619     WBC 4.91 x10 3/uL      Hgb 13.0 g/dL      Hematocrit 14.7 %      Platelets 339 x10 3/uL      RBC 4.42 x10 6/uL      MCV 90.5 fL      MCH 29.4 pg      MCHC 32.5 g/dL      RDW 13 %      MPV 10.2 fL      Neutrophils 34.9 %      Lymphocytes Automated 54.6 %      Monocytes 7.7 %      Eosinophils Automated 2.2 %      Basophils Automated 0.4 %      Immature Granulocyte 0.2 %      Nucleated RBC 0.0 /100 WBC      Neutrophils Absolute 1.71 x10 3/uL      Abs Lymph Automated 2.68 x10 3/uL      Abs Mono Automated 0.38 x10 3/uL      Abs Eos Automated 0.11 x10 3/uL      Absolute Baso Automated 0.02 x10 3/uL      Absolute Immature Granulocyte 0.01 x10 3/uL      Absolute NRBC 0.00 x10 3/uL     Narrative:       Replace urinary catheter prior to obtaining the urine culture  if it has been in place for greater than or equal to 14  days:->N/A No Foley  Indications for U/A Reflex to Micro - Reflex to  Culture:->Suprapubic Pain/Tenderness or Dysuria    Urine HCG, POC/ Qualitative [[id number]] Collected:  09/21/18 1556    Specimen:  Urine Updated:  09/21/18 1602     POCT QC Pass     POCT Pregnancy HCG Test, UR Negative     Comment: Negative  Value is Normal in Healthy Males or Healthy non-pregnant Females              Radiology Results:      No orders to display               Scribe Attestation:      I was acting as  a scribe for Merrilee Seashore, MD on Gennette Pac    I am the first provider for this patient and I personally performed the services documented. Larwance Rote is scribing for me on Alexis Gentry,Alexis Gentry. This note and the patient instructions accurately reflect work and decisions made by me.  Merrilee Seashore, MD            Merrilee Seashore, MD  09/23/18 [phone]

## 2018-09-22 DIAGNOSIS — R000 Tachycardia, unspecified: Secondary | ICD-10-CM

## 2018-09-22 DIAGNOSIS — R531 Weakness: Principal | ICD-10-CM

## 2018-09-22 DIAGNOSIS — R202 Paresthesia of skin: Secondary | ICD-10-CM

## 2018-09-22 DIAGNOSIS — R200 Anesthesia of skin: Secondary | ICD-10-CM

## 2018-09-22 LAB — CBC AND DIFFERENTIAL
Absolute NRBC: 0 10*3/uL (ref 0.00–0.00)
Basophils Absolute Automated: 0.03 10*3/uL (ref 0.00–0.08)
Basophils Automated: 0.6 %
Eosinophils Absolute Automated: 0.1 10*3/uL (ref 0.00–0.44)
Eosinophils Automated: 2 %
Hematocrit: 35.5 % (ref 34.7–43.7)
Hgb: 11.5 g/dL (ref 11.4–14.8)
Immature Granulocytes Absolute: 0.01 10*3/uL (ref 0.00–0.07)
Immature Granulocytes: 0.2 %
Lymphocytes Absolute Automated: 2.85 10*3/uL (ref 0.42–3.22)
Lymphocytes Automated: 58 %
MCH: 28.5 pg (ref 25.1–33.5)
MCHC: 32.4 g/dL (ref 31.5–35.8)
MCV: 88.1 fL (ref 78.0–96.0)
MPV: 10.1 fL (ref 8.9–12.5)
Monocytes Absolute Automated: 0.37 10*3/uL (ref 0.21–0.85)
Monocytes: 7.5 %
Neutrophils Absolute: 1.55 10*3/uL (ref 1.10–6.33)
Neutrophils: 31.7 %
Nucleated RBC: 0 /100 WBC (ref 0.0–0.0)
Platelets: 316 10*3/uL (ref 142–346)
RBC: 4.03 10*6/uL (ref 3.90–5.10)
RDW: 13 % (ref 11–15)
WBC: 4.91 10*3/uL (ref 3.10–9.50)

## 2018-09-22 LAB — C-REACTIVE PROTEIN: C-Reactive Protein: <0.1 mg/dL (ref 0.0–0.8)

## 2018-09-22 LAB — VITAMIN B12: Vitamin B-12: 706 pg/mL (ref 211–911)

## 2018-09-22 LAB — PT AND APTT
PT INR: 1.1 (ref 0.9–1.1)
PT: 14.3 s (ref 12.6–15.0)
PTT: 37 s (ref 23–37)

## 2018-09-22 LAB — BASIC METABOLIC PANEL
BUN: 13 mg/dL (ref 7.0–19.0)
CO2: 22 mEq/L (ref 22–29)
Calcium: 9.1 mg/dL (ref 8.5–10.5)
Chloride: 106 mEq/L (ref 100–111)
Creatinine: 0.7 mg/dL (ref 0.6–1.0)
Glucose: 83 mg/dL (ref 70–100)
Potassium: 3.8 mEq/L (ref 3.5–5.1)
Sodium: 135 mEq/L — ABNORMAL LOW (ref 136–145)

## 2018-09-22 LAB — SYPHILIS SCREEN IGG AND IGM: Syphilis Screen IgG and IgM: NONREACTIVE

## 2018-09-22 LAB — MAGNESIUM: Magnesium: 1.9 mg/dL (ref 1.6–2.6)

## 2018-09-22 LAB — PHOSPHORUS: Phosphorus: 4.3 mg/dL (ref 2.3–4.7)

## 2018-09-22 LAB — GFR: EGFR: >60

## 2018-09-22 LAB — FOLATE: Folate: 10.5 ng/mL

## 2018-09-22 LAB — CK: Creatine Kinase (CK): 78 U/L (ref 29–168)

## 2018-09-22 LAB — TSH: TSH: 0.58 u[IU]/mL (ref 0.35–4.94)

## 2018-09-22 LAB — HIV AG/AB 4TH GENERATION: HIV Ag/Ab, 4th Generation: NONREACTIVE

## 2018-09-22 LAB — HEMOLYSIS INDEX: Hemolysis Index: 12 (ref 0–18)

## 2018-09-22 LAB — SEDIMENTATION RATE: Sed Rate: 34 mm/Hr — ABNORMAL HIGH (ref 0–20)

## 2018-09-22 MED ORDER — GABAPENTIN 100 MG PO CAPS
200.00 mg | ORAL_CAPSULE | Freq: Three times a day (TID) | ORAL | Status: DC
Start: 2018-09-22 — End: 2018-09-22

## 2018-09-22 MED ORDER — PLASMA-LYTE A IV INFUSION
INTRAVENOUS | Status: DC
Start: 2018-09-22 — End: 2018-09-23

## 2018-09-22 NOTE — Plan of Care (Signed)
Problem: Moderate/High Fall Risk Score >5  Goal: Patient will remain free of falls  Outcome: Progressing  Flowsheets (Taken 09/21/2018 2100)  High (Greater than 13): HIGH-Consider use of low bed;HIGH-Initiate use of floor mats as appropriate;HIGH-Activate bed/chair exit alarm where available     Problem: Safety  Goal: Patient will be free from injury during hospitalization  Outcome: Progressing  Flowsheets (Taken 09/22/2018 0109)  Patient will be free from injury during hospitalization : Assess patient's risk for falls and implement fall prevention plan of care per policy; Provide and maintain safe environment; Use appropriate transfer methods; Ensure appropriate safety devices are available at the bedside; Include patient/ family/ care giver in decisions related to safety; Hourly rounding; Assess for patients risk for elopement and implement Elopement Risk Plan per policy     Problem: Neurological Deficit  Goal: Neurological status is stable or improving  Outcome: Progressing  Flowsheets (Taken 09/22/2018 0109)  Neurological status is stable or improving: Monitor/assess/document neurological assessment (Stroke: every 4 hours); Perform CAM Assessment     Problem: Peripheral Neurovascular Impairment  Goal: Extremity color, movement, sensation are maintained or improved  Outcome: Progressing  Flowsheets (Taken 09/22/2018 0109)  Extremity color, movement, sensation are maintained or improved : Increase mobility as tolerated/progressive mobility; Assess extremity for proper alignment; Teach/review/reinforce ankle pump exercises; VTE Prevention: Administer anticoagulant(s) and/or apply anti-embolism stockings/devices as ordered     Problem: Impaired Mobility  Goal: Mobility/Activity is maintained at optimal level for patient  Outcome: Progressing  Flowsheets (Taken 09/22/2018 0109)  Mobility/activity is maintained at optimal level for patient: Increase mobility as tolerated/progressive mobility; Encourage independent  activity per ability; Maintain proper body alignment; Perform active/passive ROM; Plan activities to conserve energy, plan rest periods; Reposition patient every 2 hours and as needed unless able to reposition self; Assess for changes in respiratory status, level of consciousness and/or development of fatigue; Consult/collaborate with Physical Therapy and/or Occupational Therapy     Problem: Anxiety  Goal: Anxiety is at a manageable level  Outcome: Progressing  Flowsheets (Taken 09/22/2018 0109)  Anxiety is at a manageable level: Orient to unit; Inform/explain to patient/patient care companion all tests/procedures/treatment/care prior to initiation; Facilitate expression of feelings, fears, concerns, anxiety; Assess emotional status and coping mechanisms; Provide alternatives to reduce anxiety; Provide and maintain a safe environment; Provide emotional support; Encourage participation in care

## 2018-09-22 NOTE — UM Notes (Addendum)
09/21/18 1939    Admit to Inpatient     Dx: Weakness of both lower extremities [R29.898]    LOC: MS    23 yr female presents to ED  CC: LE weakness - chief complaint of 3 weeks gradually and slowly worsening lower extremity weakness, numbness and 2 episodes of urine incontinency.    no significant past medical history     Patient came here [place] and stayed with her mom given she was not able to ambulate independently, as per her mother her symptom gradually worsened, patient reports that she had 2 episodes of urine incontinency but no fecal incontinency, patient reports that she did not feel her urine is coming out. She has been seen by her PCP recommended to see a neurologist as an outpatient and go through physical therapy which did not work, she came today because she had another fall.      VS: 98.5, RH 88, RR 20, 119/56    MRI L-spine:    Unremarkable study.        NEUROLOGY:   worsening 3 weeks of lower extremity weakness and numbness and 2 episodes of urinary incontinence. Examination revealed bilateral lower extremity weakness.  The right lower extremity slightly decreased touch as compared to left lower extremity but temperature and vibration sense were intact.  Imaging of the lumbar spine reviewed which was unremarkable.  Continue home medications.  Neurochecks every 4 hours.  Fall precautions.  Consider EMG nerve conduction study.  Check TSH, vitamin B12, ANA and CPK.  PT/OT and continue management per primary team.    PLAN:   #Acute/subacute bilateral lower extremity weakness  Started 3 weeks ago, [place]  including brain and spine MRI, LP unremarkable.  Gradually worsening symptom, significant ambulatory dysfunction, twice urinary incontinency, frequent falls.rigidity noted on exam.  Positive family history although its vague and unclear what was the exact diagnosis for her mom.  Less likely to be Guillain-Barr given her weakness did not extend to her upper extremity  also had a negative LP with normal protein, cannot rule it out completely.  Possibility of idiopathic versus inflammatory transverse myelitis although she does not have flaccid paresis.    -Neurology was consulted from ED, will see patient tomorrow.  -Follow-up spinal cord MRI, EMG  -Appreciate neurology recommendation for further imaging and work-ups  -Follow-up ESR/CRP, vitamin B12, folate, syphilis serology.  -PT and OT      UTILIZATION REVIEW CONTACT: Name: Jarrett Soho, RN BSN  Clinical Case Manager  - Utilization Review  [facility]  Address:  [address] [place], [place]  [address]  NPI:   [id number]  Tax ID:  [id number]  Phone: [phone]  Fax: [phone]    Please use fax number [phone] to provide authorization for hospital services or to request additional information.

## 2018-09-22 NOTE — Progress Notes (Signed)
NURSING PROGRESS NOTE    Patient Name: Alexis Gentry (23 y.o. female)  Admission Date: 09/21/2018 [facility] Day 1)    Shift Note: Received patient at approximately 2100 from ED. Oriented to room, call light, and plan of care. Patient A&Ox4, MAE, FC, BUE cannot overcome resistance with tremor and tingling noted to R hand, BLE overcome gravity, R leg numb from foot to knee, L foot numb w/ tingling to calf, 2 person assist to [facility], continent, on tele NSR, no c/o pain, 1 mg ativan given prior to MRI L-spine, Mom at bedside took necklace home prior to MRI, 4 eyes in 4 hours completed with Sue Lush, RN- tattoo on R thigh, Fall precautions in place, floor mat in place, bed locked in lowest positon, call light within reach, instructed to call for help as needed. Bed alarm on.     Recent Labs   Lab 09/21/18  1558   Sodium 136   Potassium 4.0   Chloride 105   CO2 22   BUN 17.0   Creatinine 0.8   EGFR >60.0   Glucose 71   Calcium 9.4       Recent Labs   Lab 09/21/18  1558   WBC 4.91   Hgb 13.0   Hematocrit 40.0   Platelets 339         Patient Lines/Drains/Airways Status    Active Lines, Drains and Airways     Name:   Placement date:   Placement time:   Site:   Days:    Peripheral IV 09/21/18 Right Antecubital   09/21/18    2026    Antecubital   less than 1                MEWS Score: 1    Last BM: 09/21/18  Pending Orders: EMG/ Neurology consult  Discharge Plan: TBD    Safety Checklist    Fall Precautions Y    Avasys N    Seizure Precautions N    Aspiration Precautions N       Interpreter Services:  Does the patient require an Interpreter? No    If yes, what form of interpreter services was used? N/A     If family was utilized, is interpreter waiver form signed and in the chart? N/A

## 2018-09-22 NOTE — PT Eval Note (Signed)
[facility]   Physical Therapy Evaluation     Patient: Alexis Gentry    MRN#: [id number]   Unit: [facility] TOWER 7  Bed: F740/F740.01    Post Acute Care Therapy Recommendations:   Discharge Recommendations: Acute Rehab((many progress to home))     Milestones to be reached to achieve recommendation: n/a  Anticipate achievement in n/a sessions    DME Recommended for Discharge: (TBD at rehab )    If Acute Rehab recommended discharge disposition is not available, patient will need hands on assist for all functional mobility with transport chair, shower chair and HHPT.     Therapy discharge recommendations may change with patient status.  Please refer to most recent note for up-to-date recommendations.    Assessment:   Significant Findings: notable BLE weakness     Alexis Gentry is a 23 y.o. female admitted 09/21/2018.  Patient presents with significant LE weakness impairing safety, ease and independence with functional mobility. Pt still able to ambulate short distances in the room with slow discontinuous steps and frequent RLE buckling. MinA and RW required for stability. VC for optimal sequencing with transfers and gait with appropriate carryover. Pt has sustained multiple falls since December when symptoms began and will benefit from continued acute PT services to maximize functional mobility prior to discharge. Will recommend acute rehab for improved strength, balance and endurance and safe sustainable discharge to home with decreased risk for falls.     Functional Limitations: Assessment: Decreased LE strength, Decreased endurance/activity tolerance, Gait impairment, Decreased balance, Decreased functional mobility, Decreased sensation    Therapy Diagnosis: Decreased functional mobility.     Rehabilitation Potential: Good    Treatment Activities:   PT Evaluation , Bed mobility training, Transfer training , Gait training  and Therapeutic exercises     Educated the patient to role of  physical therapy, plan of care, goals of therapy and safety with mobility and ADLs.    Plan:   Treatment/Interventions:   Treatment/Interventions: Exercise;Gait training;Neuromuscular re-education;Functional transfer training;LE strengthening/ROM;Endurance training;Patient/family training;Equipment eval/education;Bed mobility;Compensatory technique education;Continued evaluation     PT Frequency: 3-4x/wk     Risks/Benefits/POC Discussed with Pt/Family: With patient/family        Precautions and Contraindications:   Weight bearing: No Restrictions    Falls     Consult received for [name] for PT Evaluation and Treatment.  Patients medical condition is appropriate for Physical Therapy intervention at this time.    History of Present Illness:   Alexis Gentry is a 23 y.o. female admitted on 09/21/2018 with "no significant past medical history who presented to the hospital due to a fall today while she was using walker for walking.     Patient problem started on December 18 while she was in [place] at her work and she had a fall with lower extremity weakness, she was admitted to the hospital in [place] from 21st-20 4 December with work-ups including brain and spine MRI, LP was done.  Patient's mother has the report of MRI and LP on her cell phone which are unremarkable.  Patient came here to [place] and stayed with her mom given she was not able to ambulate independently, as per her mother her symptom gradually worsened, patient reports that she had 2 episodes of urine incontinency but no fecal incontinency, patient reports that she did not feel her urine is coming out.  She has been seen by her PCP recommended to see a neurologist as an outpatient and  go through physical therapy which did not work, she came today because she had another fall.     Mom reports she had a viral URI around Thanksgiving time but no more recent viral illness recently.     Of note patient's mom reports that she had the  same problem at the same age when she was young and she underwent massive work-ups, still following with [facility] clinic, no significant reason was found she has been given several courses of steroid treatment in the past, patient reports that her symptom resolved after her second pregnancy" per H&P     Medical Diagnosis: Weakness of both lower extremities [R29.898]    Past Medical/Surgical History:  History reviewed. No pertinent past medical history.  Past Surgical History:   Procedure Laterality Date    APPENDECTOMY      CHOLECYSTECTOMY      WISDOM TOOTH EXTRACTION         Imaging/Tests/Labs:  Mri Lumbar Spine W Wo Contrast    Result Date: 09/22/2018   Unremarkable study. 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 Mother home during the day.     Subjective:   "It feels weird" re BLE  Patient is agreeable to participation in the therapy session. Nursing clears patient for therapy.     Patient Goal: Get better    Pain Assessment: no/denies pain     Objective:   Patient is in bed with Telemetry, access in place.    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     Inspection/Posture  R hand resting tremors     Musculoskeletal Examination  Gross ROM  Right Upper Extremity ROM: WFL   Left Upper Extremity ROM: WFL  Right Lower Extremity ROM: WFL - diminished DF and hip flex  Left Lower Extremity ROM: WFL - diminished DF and hip flex     Gross Strength   Right Upper extremity: 3+/5  Left Upper Extremity: 3+/5    Lower Extremity  Hip flex Hip abd/add Knee ext Knee flex  Ankle DF/PF   RIGHT 3+/5 3/5 , 2+/5 3+/5 3+/5 3-/5, 2-/5   LEFT 3+/5 3/5, 2+/5 4/5 3+/5 3-/5, 2-/5     Functional Mobility  Supine to sit: CGA/SBA  Sit to supine: CGA  Sit to stand: Min Assist  with Rolling walker   Stand to sit: Min Assist  with Rolling walker   Increased time/effort and compensation via BUE support. Difficulty attaining full terminal knee extension.   Cueing required:  VC/TC for AD management , upright posture, sequencing , LE activation, hand placement,terminal knee extension and anterior weight shifting.     PMP - Progressive Mobility Protocol   PMP Activity: Step 6 - Walks in Room  Distance Walked (ft) (Step 6,7): 30 Feet     Ambulation  Distance: 30 ft   Device: Rolling walker   Assist level: Min Assist   Pattern: decreased cadence, decreased step length, decreased foot clearance, decreased heel-toe gait, step through, decreased RLE stance time with intermittent knee buckling.   Cueing required: VC/TC for AD management , upright posture, increased step length , increased foot clearance , appropriate proximity to AD, sequencing  and LE activation    Balance  Static sitting: Good  Dynamic sitting: Good-  Static Standing: Fair   Dynamic standing: Fair     Participation and Activity Tolerance  Participation effort: Good  Patient endurance: Good    Patient left in bed with call bell within reach, all needs met and all questions answered.  SCDs off  as ofund, fall mat in place, bed alarm on, chair alarm n/a   RN notified of session outcome and patient response.     Goals  Goal Formulation: With patient/family  Time for Goal Acheivement: 5 visits  Goals: Select goal  Pt Will Go Supine To Sit: with supervision, to maximize functional mobility and independence  Pt Will Perform Sit to Stand: with supervision, to maximize functional mobility and independence(with LRAD )  Pt Will Ambulate: 101-150 feet, with supervision, to maximize functional mobility and independence(with LRAD )      Torrie Mayers, DPT, [phone]      Time of Treatment  PT Received On: 09/22/18  Start Time: 1017  Stop Time: 1100  Time Calculation (min): 43 min

## 2018-09-22 NOTE — Consults (Signed)
[facility] Consultation Note                                       Date Time: 09/22/18 8:33 AM  Patient Name: Alexis Gentry  Requesting Physician: Tery Sanfilippo, *  Date of Admission: 09/21/2018    CC / Reason for Consultation: Lower extremity weakness          Assessment:   23 y.o. female without significant pmhx who presents to the hospital 09/21/2018 with 3 weeks of gradually worsening lower extremity weakness and numbness and 2 episodes of urinary incontinence.      1.  BLE weakness   ESR: 34 (H); CRP: <0.1; Folate 10.5; HIV: negative; RPR: negative   MRI Lumbar spine 09/22/2018 was unremarkable    Plan:   Continue home medications  Follow up EMG  Follow up TSH, vitamin B12, ANA, CPK  Continue medical management by primary team  Supportive therapies:  PT/OT  DVT ppx  Further recommendations pending above results      Neurology Attending Note:  23 year old female with medical history significant for worsening 3 weeks of lower extremity weakness and numbness and 2 episodes of urinary incontinence. Examination revealed bilateral lower extremity weakness.  The right lower extremity slightly decreased touch as compared to left lower extremity but temperature and vibration sense were intact.  Imaging of the lumbar spine reviewed which was unremarkable.  Continue home medications.  Neurochecks every 4 hours.  Fall precautions.  Consider EMG nerve conduction study.  Check TSH, vitamin B12, ANA and CPK.  PT/OT and continue management per primary team.  I reviewed the chart, relevant neuro-imaging and labs on the above patient. I personally examined the patient. I also conferred with the APP and agree with the findings and plan outlined above.   ?     Arlana Pouch,  MD, [facility]  Neurology  Professor, Department of Neurosciences  [employer] [facility]   Board Certified in Neurology by ABPN   Board Certified in Epilepsy by ABPN     HPI    Alexis Gentry is a 23 y.o. female without significant pmhx who presents to the hospital 09/21/2018 with 3 weeks of gradually worsening lower extremity weakness and numbness and 2 episodes of urinary incontinence.  Per medical records, patient had partial work ups in [place] including Brain and Spine MRIs and LP which were reported to be unremarkable.  Patient is having significant ambulatory dysfunction and frequent falls. She reports that she walks with a rolling walker or a 2 person assist. Patient reports that she had 2 episodes of urinary incontinence.  She reports that she did not feel like she had to urinate when she just voided her bladder.  Patient denies fecal incontinence and denies saddle paresthesia. Per medical records, patient had a viral illness around Thanksgiving.  Patient's mother had a similar episode of being unable to walk when she was patient's age and is still being followed by [facility] clinic.  Mother is not at the bedside to provide more details.  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  She has since, moved back home with her mother.  On exam, patient is awake, alert, answering questions and following commands.  Patient reports that initially her legs were just numb  and she could not feel them.  The numbness was followed by weakness and pain.  Patient has significant BLE weakness on exam.  She is noted to have RUE action tremor she reports began at the same time as her BLE symptoms.  Patient denies headache, dizziness, n/v, and visual disturbance.    MRI Lumbar Spine W WO Contrast 09/22/2018  IMPRESSION: Unremarkable study.    Past Medical Hx   History reviewed. No pertinent past medical history.       Past Surgical Hx:     Past Surgical History:   Procedure Laterality Date    APPENDECTOMY      CHOLECYSTECTOMY      WISDOM TOOTH EXTRACTION          Family Medical History:    History reviewed. No pertinent  family history.    Social Hx     Social History     Socioeconomic History    Marital status: Single     Spouse name: Not on file    Number of children: Not on file    Years of education: Not on file    Highest education level: Not on file   Occupational History    Not on file   Social Needs    Financial resource strain: Not on file    Food insecurity:     Worry: Not on file     Inability: Not on file    Transportation needs:     Medical: Not on file     Non-medical: Not on file   Tobacco Use    Smoking status: Never Smoker    Smokeless tobacco: Former Neurosurgeon   Substance and Sexual Activity    Alcohol use: No    Drug use: No    Sexual activity: Not on file   Lifestyle    Physical activity:     Days per week: Not on file     Minutes per session: Not on file    Stress: Not on file   Relationships    Social connections:     Talks on phone: Not on file     Gets together: Not on file     Attends religious service: Not on file     Active member of club or organization: Not on file     Attends meetings of clubs or organizations: Not on file     Relationship status: Not on file    Intimate partner violence:     Fear of current or ex partner: Not on file     Emotionally abused: Not on file     Physically abused: Not on file     Forced sexual activity: Not on file   Other Topics Concern    Not on file   Social History Narrative    Not on file       Meds     Home :   Prior to Admission medications    Medication Sig Start Date End Date Taking? Authorizing Provider   Multiple Vitamins-Minerals (MULTIVITAMIN WITH MINERALS) tablet Take 1 tablet by mouth daily   Yes [provider]   vitamin D, cholecalciferol, 10 MCG (400 UNIT) tablet Take 10 mcg by mouth daily   Yes [provider]   levonorgestrel-ethinyl estradiol (AVIANE,ALESSE,LESSINA) 0.1-20 MG-MCG per tablet Take 1 tablet by mouth daily.    [provider]      Inpatient :   Current Facility-Administered Medications   Medication  Dose Route Frequency  enoxaparin  40 mg Subcutaneous Daily         Allergies    Penicillins      Review of Systems   All other systems were reviewed and are negative except for that mentioned in the HPI    Physical Exam:   Temp:  [98.4 F (36.9 C)-98.6 F (37 C)] 98.4 F (36.9 C)  Heart Rate:  [77-95] 77  Resp Rate:  [16-20] 18  BP: (99-124)/(56-69) 99/64     Vital Signs:  Reviewed    General: The patient was well developed and well nourished.  No acute distress. Cooperative with the exam.  Follows commands  Neck:  No carotid bruits  CVS: RRR  Resp: CTA bilaterally, no retractions  Abd: Soft, nontender  Extremities: no pedal edema, extremities normal in color    Mental Status: The patient is awake, alert and oriented to person, place, and time.  Affect is normal  Fund of knowledge appropriate  Recent and remote memory are intact   Attention span and concentration appear normal.  Language function is normal. There is no evidence of aphasia in conversational speech.    Cranial nerves:   -CN II: Visual fields full to bedside confrontation   -CN III, IV, VI: Pupils equal, round, and reactive to light; extraocular movements intact; no ptosis              -CN V: Facial sensation intact in V1 through V3 distributions   -CN VII: Face symmetric   -CN VIII: Hearing intact to conversational speech   -CN IX, X: Normal phonation   -CN XI: Symmetric strength of sternocleidomastoid and trapezius muscles   -CN XII: Tongue protrudes midline    Motor: Muscle tone normal without spasticity or flaccidity. No atrophy.  No pronator drift.  Possible decreased effort on strength exam  Strength          R / L                                         R / L  Deltoid             5 / 5                 Hip Flexion      3 / 3  Triceps            5 / 5                 Hip extension  3 / 3  Biceps             5 / 5                 Knee flexion    2 / 2  Wrist ext          5 / 5                 Knee ext         2 / 2  Wrist flexion    5 / 5                  Dorsiflexion     3 / 3  FF                   5 / 5  Plantar flexion 2 / 2    Sensory:   Light touch intact but patient reports RLE slightly diminished compared to LLE from knee to foot  Temperature intact.  Vibration intact.  Patient reports subjective feeling of cold in her feet and is wearing double socks    Reflexes:  R / L     R / L  Biceps  2 / 2  Knees  2 / 2  Triceps 2 / 2  Ankles  2 / 2  BR                   2 / 2  Plantars Flexor / Flexor    Coordination: FTN intact, no truncal ataxia. RAMs intact but diminished. RUE action tremors    Gait: Deferred due to patient safety concerns      Labs:     Results     Procedure Component Value Units Date/Time    Folate [[id number]] Collected:  09/22/18 0432    Specimen:  Blood Updated:  09/22/18 0727     Folate 10.5 ng/mL     HIV Ag/Ab 4th generation [[id number]] Collected:  09/22/18 0432     Updated:  09/22/18 0727     HIV Ag/Ab, 4th Generation Non-Reactive    Syphilis Screen IgG and IgM [[id number]] Collected:  09/22/18 0432     Updated:  09/22/18 0715     Syphilis Screen IgG and IgM Nonreactive    Sedimentation rate (ESR) [[id number]]  (Abnormal) Collected:  09/22/18 0432    Specimen:  Blood Updated:  09/22/18 0706     Sed Rate 34 mm/Hr     Hemolysis index [[id number]] Collected:  09/22/18 0432     Updated:  09/22/18 0651     Hemolysis Index 12    Vitamin B12 [[id number]] Collected:  09/22/18 0432    Specimen:  Blood Updated:  09/22/18 0642    Phosphorus [[id number]] Collected:  09/22/18 0432    Specimen:  Blood Updated:  09/22/18 0550     Phosphorus 4.3 mg/dL     Magnesium [[id number]] Collected:  09/22/18 0432    Specimen:  Blood Updated:  09/22/18 0550     Magnesium 1.9 mg/dL     Basic Metabolic Panel [[id number]]  (Abnormal) Collected:  09/22/18 0432    Specimen:  Blood Updated:  09/22/18 0549     Glucose 83 mg/dL      BUN 73.4 mg/dL      Creatinine 0.7 mg/dL      Calcium 9.1 mg/dL      Sodium 193 mEq/L      Potassium 3.8 mEq/L      Chloride  106 mEq/L      CO2 22 mEq/L     C Reactive Protein [[id number]] Collected:  09/22/18 0432    Specimen:  Blood Updated:  09/22/18 0549     C-Reactive Protein <0.1 mg/dL     GFR [[id number]] Collected:  09/22/18 0432     Updated:  09/22/18 0549     EGFR >60.0    PT/APTT [[id number]] Collected:  09/22/18 0432     Updated:  09/22/18 0533     PT 14.3 sec      PT INR 1.1     PTT 37 sec     CBC and differential [[id number]] Collected:  09/22/18 0432    Specimen:  Blood Updated:  09/22/18 0531     WBC 4.91 x10 3/uL      Hgb 11.5 g/dL  Hematocrit 35.5 %      Platelets 316 x10 3/uL      RBC 4.03 x10 6/uL      MCV 88.1 fL      MCH 28.5 pg      MCHC 32.4 g/dL      RDW 13 %      MPV 10.1 fL      Neutrophils 31.7 %      Lymphocytes Automated 58.0 %      Monocytes 7.5 %      Eosinophils Automated 2.0 %      Basophils Automated 0.6 %      Immature Granulocyte 0.2 %      Nucleated RBC 0.0 /100 WBC      Neutrophils Absolute 1.55 x10 3/uL      Abs Lymph Automated 2.85 x10 3/uL      Abs Mono Automated 0.37 x10 3/uL      Abs Eos Automated 0.10 x10 3/uL      Absolute Baso Automated 0.03 x10 3/uL      Absolute Immature Granulocyte 0.01 x10 3/uL      Absolute NRBC 0.00 x10 3/uL     UA, Reflex to Microscopic - No Culture [[id number]] Collected:  09/21/18 2232    Specimen:  Urine Updated:  09/21/18 2246     Urine Type Clean Catch     Color, UA Yellow     Clarity, UA Clear     Specific Gravity UA 1.024     Urine pH 6.0     Leukocyte Esterase, UA Negative     Nitrite, UA Negative     Protein, UR Negative     Glucose, UA Negative     Ketones UA Negative     Urobilinogen, UA Normal mg/dL      Bilirubin, UA Negative     Blood, UA Negative    Comprehensive metabolic panel [[id number]] Collected:  09/21/18 1558    Specimen:  Blood Updated:  09/21/18 1634     Glucose 71 mg/dL      BUN 47.8 mg/dL      Creatinine 0.8 mg/dL      Sodium 295 mEq/L      Potassium 4.0 mEq/L      Chloride 105 mEq/L      CO2 22 mEq/L      Calcium 9.4 mg/dL      Protein, Total  8.2 g/dL      Albumin 4.6 g/dL      AST (SGOT) 15 U/L      ALT 17 U/L      Alkaline Phosphatase 95 U/L      Bilirubin, Total 1.1 mg/dL      Globulin 3.6 g/dL      Albumin/Globulin Ratio 1.3    Narrative:       Replace urinary catheter prior to obtaining the urine culture  if it has been in place for greater than or equal to 14  days:->N/A No Foley  Indications for U/A Reflex to Micro - Reflex to  Culture:->Suprapubic Pain/Tenderness or Dysuria    Lipase [[id number]] Collected:  09/21/18 1558    Specimen:  Blood Updated:  09/21/18 1634     Lipase 29 U/L     Narrative:       Replace urinary catheter prior to obtaining the urine culture  if it has been in place for greater than or equal to 14  days:->N/A No Foley  Indications for U/A Reflex to Micro - Reflex to  Culture:->Suprapubic Pain/Tenderness or Dysuria    GFR [[id number]] Collected:  09/21/18 1558     Updated:  09/21/18 1634     EGFR >60.0    Narrative:       Replace urinary catheter prior to obtaining the urine culture  if it has been in place for greater than or equal to 14  days:->N/A No Foley  Indications for U/A Reflex to Micro - Reflex to  Culture:->Suprapubic Pain/Tenderness or Dysuria    UA Reflex to Micro - Reflex to Culture [[id number]]  (Abnormal) Collected:  09/21/18 1558     Updated:  09/21/18 1622     Urine Type Urine, Clean Ca     Color, UA Yellow     Clarity, UA Clear     Specific Gravity UA 1.027     Urine pH 5.0     Leukocyte Esterase, UA Negative     Nitrite, UA Negative     Protein, UR Negative     Glucose, UA Negative     Ketones UA Negative     Urobilinogen, UA Normal mg/dL      Bilirubin, UA Negative     Blood, UA Small     RBC, UA 3 - 5 /hpf      WBC, UA 0 - 5 /hpf      Squamous Epithelial Cells, Urine 0 - 5 /hpf     Narrative:       Replace urinary catheter prior to obtaining the urine culture  if it has been in place for greater than or equal to 14  days:->N/A No Foley  Indications for U/A Reflex to Micro - Reflex to  Culture:->Suprapubic  Pain/Tenderness or Dysuria    CBC and differential [[id number]] Collected:  09/21/18 1558    Specimen:  Blood Updated:  09/21/18 1619     WBC 4.91 x10 3/uL      Hgb 13.0 g/dL      Hematocrit 25.9 %      Platelets 339 x10 3/uL      RBC 4.42 x10 6/uL      MCV 90.5 fL      MCH 29.4 pg      MCHC 32.5 g/dL      RDW 13 %      MPV 10.2 fL      Neutrophils 34.9 %      Lymphocytes Automated 54.6 %      Monocytes 7.7 %      Eosinophils Automated 2.2 %      Basophils Automated 0.4 %      Immature Granulocyte 0.2 %      Nucleated RBC 0.0 /100 WBC      Neutrophils Absolute 1.71 x10 3/uL      Abs Lymph Automated 2.68 x10 3/uL      Abs Mono Automated 0.38 x10 3/uL      Abs Eos Automated 0.11 x10 3/uL      Absolute Baso Automated 0.02 x10 3/uL      Absolute Immature Granulocyte 0.01 x10 3/uL      Absolute NRBC 0.00 x10 3/uL     Narrative:       Replace urinary catheter prior to obtaining the urine culture  if it has been in place for greater than or equal to 14  days:->N/A No Foley  Indications for U/A Reflex to Micro - Reflex to  Culture:->Suprapubic Pain/Tenderness or Dysuria    Urine HCG, POC/ Qualitative [[id number]] Collected:  09/21/18 1556    Specimen:  Urine Updated:  09/21/18 1602     POCT QC Pass     POCT Pregnancy HCG Test, UR Negative     Comment: Negative Value is Normal in Healthy Males or Healthy non-pregnant Females          Rads:   No results found for this or any previous visit.    Signed by,  Norvel Richards. Clemon Chambers, FNP-BC  Nurse Practitioner  [facility]  Pager: [phone]  Spectralink [phone]  [facility] Consult Spectra: [phone]  After Hours: [phone]    Please see attending Neurologist note that accompanies this mid-level encounter note.

## 2018-09-22 NOTE — Plan of Care (Signed)
NURSING PROGRESS NOTE    Patient Name: Alexis Gentry (23 y.o. female)  Admission Date: 09/21/2018 [facility] Day 1)    Shift Note: Pt is A/O x 4, follows command, speech clear and MAE. Pt does have limited movement to bilateral Le's, reports LE's feel heavy, able to overcome gravity, cannot overcome resistance, weak dorsi/plantar flexsion. Noted tremor in the RUE. + pulses and equal, + sensation in all extremities, however slightly decreased in the RLE. Tele: Tachy on tele in the 100's. Attending notified and pt started on Plasma Lyte A     No safety events.    Seen by Neurology this shift, labs drawn per order.      Recent Labs   Lab 09/22/18  0432   Sodium 135*   Potassium 3.8   Chloride 106   CO2 22   BUN 13.0   Creatinine 0.7   EGFR >60.0   Glucose 83   Calcium 9.1       Recent Labs   Lab 09/22/18  0432   WBC 4.91   Hgb 11.5   Hematocrit 35.5   Platelets 316         Patient Lines/Drains/Airways Status      Active Lines, Drains and Airways       Name:   Placement date:   Placement time:   Site:   Days:    Peripheral IV 09/21/18 Right Antecubital   09/21/18    2026    Antecubital   less than 1                    MEWS Score: 1    Last BM: 09/21/18  Pending Orders: EMG, Labs  Discharge Plan: TBD    Safety Checklist    Fall Precautions Y    Avasys Y    Seizure Precautions N    Aspiration Precautions N       Interpreter Services:  Does the patient require an Interpreter?N    If yes, what form of interpreter services was used? NA    If family was utilized, is interpreter waiver form signed and in the chart? NA  Problem: Safety  Goal: Patient will be free from injury during hospitalization  Outcome: Progressing  Flowsheets (Taken 09/22/2018 1034)  Patient will be free from injury during hospitalization : Provide and maintain safe environment; Use appropriate transfer methods; Hourly rounding; Include patient/ family/ care giver in decisions related to safety; Ensure appropriate safety devices are available at the bedside      Problem: Psychosocial and Spiritual Needs  Goal: Demonstrates ability to cope with hospitalization/illness  Outcome: Progressing  Flowsheets (Taken 09/22/2018 1034)  Demonstrates ability to cope with hospitalizations/illness: Encourage verbalization of feelings/concerns/expectations; Provide quiet environment; Assist patient to identify own strengths and abilities; Reinforce positive adaptation of new coping behaviors; Encourage participation in diversional activity; Encourage patient to set small goals for self; Include patient/ patient care companion in decisions     Problem: Neurological Deficit  Goal: Neurological status is stable or improving  Outcome: Progressing  Flowsheets (Taken 09/22/2018 1034)  Neurological status is stable or improving: Monitor/assess/document neurological assessment (Stroke: every 4 hours); Perform CAM Assessment     Problem: Impaired Mobility  Goal: Mobility/Activity is maintained at optimal level for patient  Outcome: Progressing  Flowsheets (Taken 09/22/2018 1034)  Mobility/activity is maintained at optimal level for patient: Increase mobility as tolerated/progressive mobility; Encourage independent activity per ability; Maintain proper body alignment; Perform active/passive ROM; Reposition patient every 2 hours  and as needed unless able to reposition self; Plan activities to conserve energy, plan rest periods; Assess for changes in respiratory status, level of consciousness and/or development of fatigue; Consult/collaborate with Physical Therapy and/or Occupational Therapy

## 2018-09-22 NOTE — Progress Notes (Addendum)
[facility] HOSPITALIST PROGRESS NOTE    Today's date & time: 09/22/18 3:46 PM  Patient Name: Alexis Gentry  Attending Physician: Tery Sanfilippo, *MD  Admission Date: 09/21/2018    Assessment:     Active Hospital Problems    Diagnosis    Numbness and tingling of leg    Tachycardia    Lower extremity weakness     Plan:    LE numbness and weakness - noted, currently had MRI of L spine here with no acute issues, also had MRI of brain and LP in [place], will try to get copy of it, patient is going to get EMG today, will add Gabapentin tid, follow. Got seen by PT/OT who recommended acute rehab. Laboratory studies such as Vitamin B12, Lyme titer and ANA pending, HIV, syphilis, folate lever, CRP and ESR all came back negative or non reactive. Fall precaution.    Tachycardia - monitored on tele and noted, will give IV fluid and monitor, oral hydration.     GI prophylaxis: None    Subjective     CC: Lower extremity weakness    Interval History/24 hour events: numbness below the knee bilaterally     HPI: HPI per Admitting Provider   " Alexis Gentry is a 23 y.o. female with no significant past medical history who presented to the hospital due to a fall today while she was using walker for walking.    Patient problem started on December 18 while she was in [place] at her work and she had a fall with lower extremity weakness, she was admitted to the hospital in [place] from 21st-20 4 December with work-ups including brain and spine MRI, LP was done.  Patient's mother has the report of MRI and LP on her cell phone which are unremarkable.  Patient came here to [place] and stayed with her mom given she was not able to ambulate independently, as per her mother her symptom gradually worsened, patient reports that she had 2 episodes of urine incontinency but no fecal incontinency, patient reports that she did not feel her urine is coming out.  She has been seen by her PCP recommended to see a neurologist as an  outpatient and go through physical therapy which did not work, she came today because she had another fall.    Mom reports she had a viral URI around Thanksgiving time but no more recent viral illness recently.    Of note patient's mom reports that she had the same problem at the same age when she was young and she underwent massive work-ups, still following with [facility], no significant reason was found she has been given several courses of steroid treatment in the past, patient reports that her symptom resolved after her second pregnancy. "     Hospital course:   Following admission, patient continue to have similar symptoms, she got seen by neurology consult, neuro team added MRI of L spine, which came back negative for acute issues, then recommended getting EMG which also came back negative, patients mother said she had same disease a while back, no diagnosis, patient got seen by PT/OT who recommended acute rehab placement.     Review of Systems:   General - no pain   Resp - no SOB   CVS - no CP, no CAD   GI - no nausea, no diarrhea   CNS - no visual deficit, LE weakness and numbness     Physical Exam:     Vitals:  09/21/18 2302 09/22/18 0433 09/22/18 0722 09/22/18 1148   BP: 99/62 103/66 99/64 101/61   Pulse: 84 87 77 81   Resp: 16 16 18 18    Temp: 98.6 F (37 C) 98.6 F (37 C) 98.4 F (36.9 C) 98.4 F (36.9 C)   TempSrc: Oral Oral Oral Oral   SpO2: 98% 98% 98% 98%   Weight:       Height:            No intake or output data in the 24 hours ending 09/22/18 1546    General: Awake, alert, oriented x 3; no acute distress.  HEENT: PERRLA, eomi, sclera anicteric, oropharynx clear without lesions, mucous membranes moist  Neck: supple, no lymphadenopathy, no thyromegaly, no JVD, no carotid bruits  Cardiovascular: regular rate and rhythm, no murmurs, rubs or gallops  Lungs: clear to auscultation bilaterally, without wheezing, rhonchi, or rales  Abdomen: soft, non-tender, non-distended; no palpable  masses, no hepatosplenomegaly, normoactive bowel sounds, no rebound or guarding  Extremities: no clubbing, cyanosis, or edema  Neuro: cranial nerves grossly intact, strength 5/5 in upper and 4-/5 on both lower extremities, sensation decreased below both knees, gross visual field test intact.  Skin: no rashes or lesions noted  GU: no CVA tenderness       Meds:   Medications were reviewed by me:  Current Facility-Administered Medications   Medication Dose Route Frequency    enoxaparin  40 mg Subcutaneous Daily    gabapentin  200 mg Oral Q8H [facility]       Labs:   Labs reviewed personally include:  Recent Labs   Lab 09/22/18  0432 09/21/18  1558   WBC 4.91 4.91   Hgb 11.5 13.0   Hematocrit 35.5 40.0   Platelets 316 339    Recent Labs   Lab 09/22/18  0432   PT 14.3   PT INR 1.1   PTT 37       Recent Labs   Lab 09/22/18  0432 09/21/18  1558   Sodium 135* 136   Potassium 3.8 4.0   Chloride 106 105   CO2 22 22   BUN 13.0 17.0   Creatinine 0.7 0.8   EGFR >60.0 >60.0   Glucose 83 71   Calcium 9.1 9.4    Recent Labs   Lab 09/21/18  1558   Alkaline Phosphatase 95   Bilirubin, Total 1.1   Protein, Total 8.2   Albumin 4.6   ALT 17   AST (SGOT) 15            Microbiology Results     None          Imaging personally reviewed, including:  Radiology Results (24 Hour)     Procedure Component Value Units Date/Time    MRI Lumbar Spine W WO Contrast [[id number]] Collected:  09/22/18 0836    Order Status:  Completed Updated:  09/22/18 0845    Narrative:       HISTORY: Lower extremity weakness and numbness. Paralysis.    COMPARISON: None.    TECHNIQUE: Multiplanar imaging of the lumbar spine was performed before  and after the intravenous administration of 5.6cc Gadavist   contrast.         FINDINGS: There is a normal lumbar lordosis. Vertebral body and disc  space heights are within normal limits. Bone marrow signal appears  normal. The conus medullaris appears normal, terminating at L1-L2. No  abnormal enhancement is seen. No significant  disc bulge/herniation or  stenosis is identified.      Impression:        Unremarkable study.    Leandro Reasoner, MD   09/22/2018 8:41 AM          Safety Checklist:     DVT prophylaxis:  CHEST guideline (See page [id number]) Chemical and Mechanical   Foley:  [place] Rn Foley protocol Not present   IVs:  Peripheral IV   PT/OT: Ordered   Daily CBC & or Chem ordered:  SHM/ABIM guidelines (see #5) No   Reference for approximate charges of common labs: CBC auto diff - $76   BMP - $99   Mg - $79    Lines:          Patient Lines/Drains/Airways Status    Active PICC Line / CVC Line / PIV Line / Drain / Airway / Intraosseous Line / Epidural Line / ART Line / Line / Wound / Pressure Ulcer / NG/OG Tube     Name:   Placement date:   Placement time:   Site:   Days:    Peripheral IV 09/21/18 Right Antecubital   09/21/18    2026    Antecubital   less than 1                 Disposition:   Today's date: 09/22/2018   Length of Stay: 1  Anticipated medical stability for discharge: January,  11 - Afternoon  Reason for ongoing hospitalization: LE numbness and weakness   Anticipated discharge needs: Acute rehab     Signed by: Tery Sanfilippo, MD

## 2018-09-22 NOTE — Progress Notes (Signed)
Weakness of both lower extremities [R29.898]    Situation:  The patient is a 23 year old woman with no significant past medical history who presented to the hospital with chief complaint of 3 weeks gradually and slowly worsening lower extremity weakness, numbness and 2 episodes of urine incontinency.    Met with the patient at the bedside.    Background:  Verified the patient's address, phone, emergency contact, PCP, and insurance.    Patient lives with her parents in a third floor apartment in an [place], structural building.      Until recently the patient was independent in her ADLs.  She now requires assistance at times.    Uses a rolling walker.  No home health services in place.    Patient had been able to drive herself until recently.  Family now provides transportation.    Patient uses the [facility] [address] in [place], [place].  Denies difficulty paying for prescriptions.    Patient has transportation home when ready for discharge.       09/22/18 1000   Patient Type   Within 30 Days of Previous Admission? Yes  (Discharged from hospital in [place].)   Healthcare Decisions   Interviewed: Patient   Orientation/Decision Making Abilities of Patient Alert and Oriented x3, able to make decisions   Advance Directive Patient does not have advance directive   Prior to admission   Prior level of function Independent with ADLs;Ambulates with assistive device  (Some assistance recently.)   Type of Residence Private residence   Home Layout One level;Elevator   Have running water, electricity, heat, etc? Yes   Living Arrangements Parent   How do you get to your MD appointments? Family   How do you get your groceries? Family   Who fixes your meals? Self/Family   Who does your laundry? Self/Family   Who picks up your prescriptions? Family   Dressing Needs assistance   Grooming Needs assistance   Feeding Independent   Bathing Needs assistance   Toileting Needs assistance   DME Currently at [employer], [employer]   Home  Care/Community Services None   Adult Protective Services (APS) involved? No   Discharge Planning   Support Systems Parent   Anticipated [place] plan discussed with: Same as interviewed   Mode of transportation: Private car (family member)   Does the patient have perscription coverage? Yes   Consults/Providers   PT Evaluation Needed 1   OT Evalulation Needed 1   SLP Evaluation Needed 2   Correct PCP listed in Epic? Yes   PCP   PCP on file was verified as the current PCP? Yes   Important Message from [facility] Notice   Patient received 1st IMM Letter? n/a       Assessment:  Patient has insurance coverage.  Patient has family support.  Patient has transportation home.  Anticipate patient will need physical and occupational therapy evaluations for discharge planning recommendations.    Recommendations/Response:  Will continue to assess for discharge planning needs.    Doree Albee RN, BSN  RN Case Manager  [facility]  [phone]  10:48 AM  09/22/2018

## 2018-09-22 NOTE — Procedures (Signed)
Report of EMG Results          23 year-old female was admitted with about three weeks of progressive bilateral lower limb numbness and weakness resulting in two falls.  She also reports two episodes of urinary incontinence.  She has been found to have normal reflexes and subjective sensory loss in the Right > Left lower limbs.  She has been followed by Neurology.  Electrodiagnostic testing was ordered to evaluate for the possibility of a neuropathy or myopathy.        Findings:  1. Left Radial sensory nerve conduction study recording over the anatomic snuffbox shows normal latency and normal amplitude.  2. Right Sural sensory nerve conduction study recording posterior to the lateral malleolus shows normal latency and normal amplitude.  3. Left Sural sensory nerve conduction study recording posterior to the lateral malleolus shows normal latency and normal amplitude.  4. Right Superficial Peroneal sensory nerve conduction study recording at the anterior ankle shows normal latency and normal amplitude.  5. Left Superficial Peroneal sensory nerve conduction study recording at the anterior ankle shows normal latency and normal amplitude.  6. Right Ulnar motor nerve conduction study to the abductor digiti minimi shows normal distal latency, normal amplitude, and normal conduction velocity in the forearm segment.  7. Right Common Peroneal motor nerve conduction study to the extensor digitorum brevis shows normal distal latency, normal amplitude, and normal conduction velocities in the leg and across-knee segments.  8. Right Tibial motor nerve conduction study to the abductor hallucis shows normal distal latency, normal amplitude, and normal conduction velocity in the leg segment.  9. Right Peroneal F-waves show normal minimal latency.  Right Ulnar F-waves show normal minimal latency.  10. Right Tibial H-reflex shows normal latency.  11. Needle EMG examination of selected Right lower limb muscles was performed and the  results are summarized in the table below.  All testing was performed by the signing physician.  No technicians or technologists were used in the performance of this examination.    Conclusions:     This is a normal electrodiagnostic examination.  There is no electrodiagnostic evidence of a neuropathy or myopathy.  There are no findings on this test to suggest demyelination or axon loss.  No myopathic changes were observed.  No abnormalities were detected on todays examination.    Thank you very much for this interesting consultation.  Please dont hesitate to call with any questions or concerns.    Leonie Green, MD  PM&R         Sensory NCS    Nerve / Sites Rec. Site Onset Lat Peak Lat NP Amp PP Amp Segments Distance Velocity     ms ms V V  cm m/s   L RADIAL - Wrist Antidr      Forearm Wrist 1.65 2.15 57.3 63.2 Forearm - Wrist 10 60.6   R SURAL - Lat Mall Antidr      Calf Lat Mall 1.90 2.45 43.1 43.8 Calf - Lat Mall 14 73.7   L SURAL - Lat Mall Antidr      Calf Lat Mall 1.90 2.50 36.4 36.8 Calf - Lat Mall 14 73.7   R SUP PERONEAL - Ankle Antidr      Lat Leg Ankle 1.70 2.35 53.5 50.5 Lat Leg - Ankle 14 82.4   L SUP PERONEAL - Ankle Antidr      Lat Leg Ankle 1.90 2.55 35.6 39.9 Lat Leg - Ankle 14 73.7  Motor NCS    Nerve / Sites Rec. Site Lat Amp Rel Amp Segments Dist. Vel     ms mV %  cm m/s   R ULNAR - ADM      Wrist ADM 2.70 8.9 100 Wrist - ADM 8       B.Elbow ADM 6.45 8.7 97.6 B.Elbow - Wrist 25 66.7   R COMM PERONEAL - EDB      Ankle EDB 3.45 7.4 100 Ankle - EDB 8       Fib Head EDB 9.55 6.2 83 Fib Head - Ankle 32 52.5      Knee EDB 10.70 5.8 78.2 Knee - Fib Head 6 52.2   R TIBIAL (KNEE) - AH      Ankle AH 3.60 14.0 100 Ankle - AH 8       Knee AH 10.85 11.3 81 Knee - Ankle 35.5 49.0             F  Wave    Nerve Fmin    ms   R COMM PERONEAL 42.05   R ULNAR 24.70           H Reflex    Nerve H Lat    ms   R TIBIAL (KNEE) - [place] (S1) 26.05         Reference values for nerve conduction  studies are based on:   Chen S et al., Electrodiagnostic Reference Values for Upper and Lower Limb Nerve Conduction Studies in Adult Populations. Muscle & Nerve 2016; 54: 371-377.  Limb temperature was continuously monitored during the performance of all nerve conduction studies. Where needed, limbs were warmed to maintain skin temperature > 32 C (upper limbs) or > 30 C (lower limbs).    Needle EMG      EMG Summary Table     Spontaneous MUAP Recruitment    IA Fib PSW Fasc  Amp Dur. PPP Pattern   R. TIB ANTERIOR N None None None  N N N N   R. GASTROCN (MED) N None None None  N N N N   R. EXT HALL LONG N None None None  N N N N   R. VAST MEDIALIS N None None None  N N N N   R. RECT FEMORIS N None None None  N N N N

## 2018-09-23 DIAGNOSIS — R000 Tachycardia, unspecified: Secondary | ICD-10-CM

## 2018-09-23 NOTE — Plan of Care (Signed)
Problem: Moderate/High Fall Risk Score >5  Goal: Patient will remain free of falls  Outcome: Progressing  Flowsheets (Taken 09/22/2018 2000)  High (Greater than 13): HIGH-Consider use of low bed;HIGH-Initiate use of floor mats as appropriate;HIGH-Activate bed/chair exit alarm where available     Problem: Safety  Goal: Patient will be free from injury during hospitalization  Outcome: Progressing  Flowsheets (Taken 09/23/2018 0208)  Patient will be free from injury during hospitalization : Assess patient's risk for falls and implement fall prevention plan of care per policy; Provide and maintain safe environment; Use appropriate transfer methods; Ensure appropriate safety devices are available at the bedside; Include patient/ family/ care giver in decisions related to safety; Hourly rounding; Assess for patients risk for elopement and implement Elopement Risk Plan per policy     Problem: Neurological Deficit  Goal: Neurological status is stable or improving  Outcome: Progressing  Flowsheets (Taken 09/23/2018 0208)  Neurological status is stable or improving: Monitor/assess/document neurological assessment (Stroke: every 4 hours); Perform CAM Assessment     Problem: Peripheral Neurovascular Impairment  Goal: Extremity color, movement, sensation are maintained or improved  Outcome: Progressing  Flowsheets (Taken 09/23/2018 0208)  Extremity color, movement, sensation are maintained or improved : Increase mobility as tolerated/progressive mobility; Assess extremity for proper alignment; Teach/review/reinforce ankle pump exercises     Problem: Impaired Mobility  Goal: Mobility/Activity is maintained at optimal level for patient  Outcome: Progressing  Flowsheets (Taken 09/23/2018 0208)  Mobility/activity is maintained at optimal level for patient: Increase mobility as tolerated/progressive mobility; Encourage independent activity per ability; Maintain proper body alignment; Perform active/passive ROM; Plan activities to  conserve energy, plan rest periods; Reposition patient every 2 hours and as needed unless able to reposition self; Assess for changes in respiratory status, level of consciousness and/or development of fatigue; Consult/collaborate with Physical Therapy and/or Occupational Therapy     Problem: Anxiety  Goal: Anxiety is at a manageable level  Outcome: Progressing  Flowsheets (Taken 09/23/2018 0208)  Anxiety is at a manageable level: Inform/explain to patient/patient care companion all tests/procedures/treatment/care prior to initiation; Facilitate expression of feelings, fears, concerns, anxiety; Assess emotional status and coping mechanisms; Provide alternatives to reduce anxiety; Provide and maintain a safe environment; Include patient/patient care companion in decisions related to anxiety/depression; Provide emotional support

## 2018-09-23 NOTE — Progress Notes (Addendum)
09/23/18 1548   Discharge Disposition   Patient preference/choice provided? Yes   Physical Discharge Disposition Home     Per nurse [phone] patient/patient family refused AR. Family providing patient with transport home.     CM contact [facility] [phone] about PT recommendations if not AR.     [facility] will follow up with family for DME and [facility] services.       Anner Crete  Social Worker I  Case Management   [facility] [place]   Ext: [phone]

## 2018-09-23 NOTE — Progress Notes (Signed)
NURSING PROGRESS NOTE    Patient Name: Alexis Gentry (23 y.o. female)  Admission Date: 09/21/2018 [facility] Day 2)    Shift Note: Patient A&Ox4, MAE, FC, BLE weak/overcome gravity with numbness in feet and tingling up to knee, tingling noted in fingertips of right hand and intentional tremor of the right hand, VSS, NSR on tele, on plasmalyte @ 125 ml/hr, c/o HA x1, PRN tylenol and ice pack given with relief, 2 assist to BR with walker, mom at bedside, fall precautions maintained, floor mat in place, bed locked in lowest position, call light within reach, instructed to call for help as needed, bed alarm on.    Recent Labs   Lab 09/22/18  0432   Sodium 135*   Potassium 3.8   Chloride 106   CO2 22   BUN 13.0   Creatinine 0.7   EGFR >60.0   Glucose 83   Calcium 9.1       Recent Labs   Lab 09/22/18  0432   WBC 4.91   Hgb 11.5   Hematocrit 35.5   Platelets 316         Patient Lines/Drains/Airways Status    Active Lines, Drains and Airways     Name:   Placement date:   Placement time:   Site:   Days:    Peripheral IV 09/21/18 Right Antecubital   09/21/18    2026    Antecubital   1                MEWS Score: 1    Last BM: 09/22/18  Pending Orders: N/A  Discharge Plan: TBD    Safety Checklist    Fall Precautions Y    Avasys N    Seizure Precautions N    Aspiration Precautions N       Interpreter Services:  Does the patient require an Interpreter? No    If yes, what form of interpreter services was used? N/A     If family was utilized, is interpreter waiver form signed and in the chart? N/A

## 2018-09-23 NOTE — Progress Notes (Signed)
IMG Neurology Consultation Progress Note    Date Time: 09/23/18 9:15 AM  Patient Name: Alexis Gentry,Alexis Gentry  Outpatient Neurologist :    CC: Lower extremity weakness        Assessment:   23 year old female with medical history significant for worsening 3 weeks of lower extremity weakness and numbness and 2 episodes of urinary incontinence. Examination revealed bilateral lower extremity weakness.  The right lower extremity slightly decreased touch as compared to left lower extremity but temperature and vibration sense were intact.  Imaging of the lumbar spine reviewed which was unremarkable.  EMG done yesterday was also unremarkable.Laboratory studies such as Vitamin B12, Lyme titer and ANA pending, HIV, syphilis, folate lever, CRP and ESR were negative    Patient Active Problem List   Diagnosis    Lower extremity weakness    Numbness and tingling of leg    Tachycardia       Plan:   Imaging reviewed which was negative.  Lab testing was also negative.  EMG was unremarkable.  Results discussed in detail with the patient and the mother.  Mother wants a trial of high-dose steroids which we will defer at this time because of inconclusive results.  Appreciate PT/OT input about acute rehab.  Plan discussed in detail with the patient and the mother.           Arlana Pouch,  MD, Jason Fila  [facility]  Neurology  Professor, Department of Neurosciences  [employer] [facility]   Board Certified in Neurology by ABPN   Board Certified in Epilepsy by ABPN       Interval History/Subjective:   Continues to have bilateral lower extremity weakness.    Medications:     Current Facility-Administered Medications   Medication Dose Route Frequency    enoxaparin  40 mg Subcutaneous Daily       Review of Systems:   Neurological ROS: Denies headache, nausea, vomiting, dizziness, but does admit to   lower extremities weakness and numbness.  Denies any visual problems or swallowing issues.    Physical  Exam:   Temp:  [97.4 F (36.3 C)-99.7 F (37.6 C)] 99.7 F (37.6 C)  Heart Rate:  [76-94] 94  Resp Rate:  [18] 18  BP: (95-119)/(53-65) 119/65         Labs:     Results     Procedure Component Value Units Date/Time    Lyme Ab Tot Rflx to WB IGG/IGM [[id number]] Collected:  09/22/18 1219    Specimen:  Blood Updated:  09/22/18 1531    ANA SCREEN REFLEX [[id number]] Collected:  09/22/18 1219     Updated:  09/22/18 1530    TSH [[id number]] Collected:  09/22/18 1219    Specimen:  Blood Updated:  09/22/18 1323     TSH 0.58 uIU/mL     Creatine Kinase (CK) [[id number]] Collected:  09/22/18 1219    Specimen:  Blood Updated:  09/22/18 1307     Creatine Kinase (CK) 78 U/L     Vitamin B12 [[id number]] Collected:  09/22/18 0432    Specimen:  Blood Updated:  09/22/18 1022     Vitamin B-12 706 pg/mL           Rads:   Mri Lumbar Spine W Wo Contrast    Result Date: 09/22/2018   Unremarkable study. Leandro Reasoner, MD 09/22/2018 8:41 AM        Signed by:  Cierra Rothgeb F Rosalina Dingwall

## 2018-09-23 NOTE — Progress Notes (Signed)
DISCHARGE NOTE      Patient: Alexis Gentry, Alexis Gentry (23 y.o., 12/19/1995)  Admission Date: 09/21/2018 (LOS: 2)    Discharge Date: 09/23/18  Discharge Disposition: home  Report Given: NA  Transportation Method: family vehicle. Volunteer wheelchair to the vehicle  Additional Discharge Info: AVS and My Chart info reviewed. IV removed    Discharge Medication List       Medication List      CONTINUE taking these medications    levonorgestrel-ethinyl estradiol 0.1-20 MG-MCG per tablet  Commonly known as:  AVIANE,ALESSE,LESSINA     multivitamin with minerals tablet     vitamin D (cholecalciferol) 10 MCG (400 UNIT) tablet

## 2018-09-23 NOTE — Discharge Summary (Signed)
[facility] Discharge Summary      Patient: Alexis Gentry  Admission Date: 09/21/2018   DOB: [birth year]  Discharge Date: 09/23/2018    MRN: [id number]  Discharge Attending: Kari Baars MD   Referring Physician: Tana Coast, MD  PCP: Tana Coast, MD       DISCHARGE SUMMARY     Discharge Information   Admission Diagnosis:   LE numbness      Discharge Diagnosis:   Patient Active Problem List    Diagnosis Date Noted    Numbness and tingling of leg 09/22/2018    Tachycardia 09/22/2018    Lower extremity weakness 09/21/2018        Discharge Medications:     Medication List      CONTINUE taking these medications    levonorgestrel-ethinyl estradiol 0.1-20 MG-MCG per tablet  Commonly known as:  AVIANE,ALESSE,LESSINA     multivitamin with minerals tablet     vitamin D (cholecalciferol) 10 MCG (400 UNIT) tablet                Hospital Course   Presentation History   Per admitting H&P " Krystianna Soth a 23 y.o.femalewith no significant past medical history who presented to the hospital due to a fall today while she was using walker for walking.    Patient problem started on December 18 while she was in [place] at her work and she had a fall with lower extremity weakness, she was admitted to the hospital in [place] from 21st-20 4 December with work-ups including brain and spine MRI, LP was done.  Patient's mother has the report of MRI and LP on her cell phone which are unremarkable.  Patient came here to [place] and stayed with her mom given she was not able to ambulate independently, as per her mother her symptom gradually worsened, patient reports that she had 2 episodes of urine incontinency but no fecal incontinency, patient reports that she did not feel her urine is coming out.  She has been seen by her PCP recommended to see a neurologist as an outpatient and go through physical therapy which did not work, she came today because she had another fall.    Mom reports she  had a viral URI around Thanksgiving time but no more recent viral illness recently.    Of note patient's mom reports that she had the same problem at the same age when she was young and she underwent massive work-ups, still following with [facility], no significant reason was found she has been given several courses of steroid treatment in the past, patient reports that her symptom resolved after her second pregnancy. "     Hospital course:   Following admission, patient continue to have similar symptoms, she got seen by neurology consult, neuro team added MRI of L spine, which came back negative for acute issues, then recommended getting EMG which also came back negative, patients mother said the mother as a child had similar symptoms which she had struggled for 7-8 years,  Per mom no diagnosis was given to her but finally she got better when she became pregnant.    While here, patient got seen by PT/OT who recommended acute rehab placement, additional labs such as Lyme titer was added which is pending, while here patient had lab workup such as HIV, syphilis, folate lever, CRP and ESR all came back negative or non reactive, still has pending lab for Lyme and ANA.  Patients mother had discussion with neurology team, mother wanted prednisone to be given but neurology team did not agree since there was no diagnosis warranting it, despite the recommendation for acute rehab, patients mother suggested for discharge home on 1/11, she is now getting cleared for [facility].     There was a plan to place patient on Gabapentin but it was discontinued since patient was given this mediations when she was 14 for diagnosis of fibromyalgia and when she was on it had suicidal thoughts so we stopped it. Now she is going home with multivitamin, recommended to follow with neurology team as outpatient.       Procedures:   MRI Lumbar Spine W WO Contrast   Final Result    Unremarkable study.      Leandro Reasoner, MD    09/22/2018  8:41 AM          Consultants  Treatment Team: Attending Provider: Tery Sanfilippo, MD; Consulting Physician: Tery Sanfilippo, MD; Technician: Patrica Duel Registered Nurse: Antionette Fairy, RN; Case Manager: Anner Crete      Best Practices   Was the patient admitted with either a CHF Exacerbation or Pneumonia? No     Progress Note/Physical Exam at Discharge     Subjective: numb lower leg    Vitals:    09/23/18 0431 09/23/18 0432 09/23/18 0736 09/23/18 1117   BP: 95/53 111/55 119/65 111/54   Pulse: 91 80 94 87   Resp: 18  18 18    Temp: 97.9 F (36.6 C)  99.7 F (37.6 C) 99 F (37.2 C)   TempSrc: Oral  Oral Oral   SpO2: 98% 97% 99% 99%   Weight:       Height:         General: Awake, alert, oriented x 3; no acute distress.  HEENT: PERRLA, eomi, sclera anicteric, oropharynx clear without lesions, mucous membranes moist  Neck: supple, no lymphadenopathy, no thyromegaly, no JVD, no carotid bruits  Cardiovascular: regular rate and rhythm, no murmurs, rubs or gallops  Lungs: clear to auscultation bilaterally, without wheezing, rhonchi, or rales  Abdomen: soft, non-tender, non-distended; no palpable masses, no hepatosplenomegaly, normoactive bowel sounds, no rebound or guarding  Extremities: no clubbing, cyanosis, or edema  Neuro: cranial nerves grossly intact, strength 5/5 in upper and 3-/5 on both lower extremities but seen effort dependent, sensation decreased below both knees, gross visual field test intact.  Skin: no rashes or lesions noted  GU: no CVA tenderness     Admission Condition: stable  Discharge Condition: good  Functional Status: Patient is independent with mobility/ambulation, transfers, ADL's, IADL's.     Diagnostics     Labs/Studies Pending at Discharge: No    Last Labs   Recent Labs   Lab 09/22/18  0432 09/21/18  1558   WBC 4.91 4.91   RBC 4.03 4.42   Hgb 11.5 13.0   Hematocrit 35.5 40.0   MCV 88.1 90.5   Platelets 316 339       Recent Labs   Lab 09/22/18  0432 09/21/18  1558    Sodium 135* 136   Potassium 3.8 4.0   Chloride 106 105   CO2 22 22   BUN 13.0 17.0   Creatinine 0.7 0.8   Glucose 83 71   Calcium 9.1 9.4   Magnesium 1.9  --        Microbiology Results     None  Patient Instructions   Discharge Diet: regular diet  Discharge Activity:  activity as tolerated  Discharge instructions:   Discharge Code Status: Full code   Additional instructions -   Disposition:  Home   Follow Up Appointment:  See Tana Coast, MD in 1 weeks.  See Dr. Berna Bue, neurology, in 1-2 weeks.     Attempted to contact PCP to update but unable to reach         The review of the patient's medications does not in any way constitute an endorsement, by this clinician, of their use, dosage, indications, route, efficacy, interactions, or other clinical parameters.    This note was generated within the EPIC EMR using Dragon medical speech recognition software and may contain inherent errors or omissions not intended by the user. Grammatical and punctuation errors, random word insertions, deletions, pronoun errors and incomplete sentences are occasional consequences of this technology due to software limitations. Not all errors are caught or corrected.  Although every attempt is made to root out erroneus and incomplete transcription, the note may still not fully represent the intent or opinion of the author. If there are questions or concerns about the content of this note or information contained within the body of this dictation they should be addressed directly with the author for clarification.    Time spent examining patient, discussing with patient/family regarding hospital course face to face, chart review, reconciling medications and discharge planning:  total time : 20 minutes.    Signed,  Kari Baars, MD  3:13 PM 09/23/2018     CC to: Tana Coast, MD

## 2018-09-24 NOTE — Progress Notes (Signed)
[facility] called patient at home [phone]. No answer and unable to leave message. Will need to try again tommorrow

## 2018-09-25 LAB — ANA SCREEN REFLEX: ANA Screen Reflex: NEGATIVE

## 2018-09-25 LAB — LYME AB, TOTAL,REFLEX TO WESTERN BLOT (IGG & IGM): Lyme AB,Total,Reflx to WB(IGM): 0.58 (ref 0.00–0.90)

## 2018-09-25 NOTE — Progress Notes (Addendum)
Home Health Referral          Referral from Doree Albee (Case Manager) for home health care upon discharge.    By [employer], the patient has the right to freely choose a home care provider.  Arrangements have been made with:     A company of the patients choosing. We have supplied the patient with a listing of providers in your area who asked to be included and participate in Medicare.   [facility], formerly [facility], a home care agency that provides adult home care services and participates in Medicare   The preferred provider of your insurance company. Choosing a home care provider other than your insurance company's preferred provider may affect your insurance coverage.      Home Health Discharge Information     Your doctor has ordered Physical Therapy and Occupational Therapy in-home service(s) for you while you recuperate at home, to assist you in the transition from hospital to home.      The agency that you or your representative chose to provide the service:   ] [facility], [phone]  [facility] confirmed)    [employer]:   ] [employer]  (P) [phone]   Equipment Ordered: front wheeled walker   The above services were set up by:  Ladell Pier  [facility] Liaison)   Phone  [phone]    IF YOU HAVE NOT HEARD FROM YOUR HOME YOUR HOME HEALTH AGENCY WITHIN 24-48 HOURS AFTER DISCHARGE PLEASE CALL YOUR AGENCY TO ARRANGE A TIME FOR YOUR FIRST VISIT. FOR ANY SCHEDULING CONCERNS OR QUESTIONS RELATED TO HOME HEALTH, SUCH AS TIME OR DATE PLEASE CONTACT YOUR HOME HEALTH AGENCY AT THE NUMBER LISTED ABOVE.    Additional comments: pt d/c 1/11 but weekend [facility] unable to contact pt re [facility] needs.  This PACC contacted today and pt has no HHA preference but address is [address], [place].    HOME HEALTH REFERRAL    PATIENT"S DEMOGRAPHICS:    Name: Alexis Gentry    Discharge Address: [address] [place], [place] [address]  Primary Telephone Number:  [phone]  Secondary Telephone Number: [phone] (mother)  Emergency Contact and Number: Extended Emergency Contact Information  Primary Emergency Contact: King,T[place]  Address: [address]           [address]           [place], [place] [address] [place]  Home Phone: [phone]  Mobile Phone: [phone]  Relation: Mother  Preferred language: [place]    Ordering Physician: Kari Baars MD    Following Physician: PCP    PCP: Tana Coast, MD, [phone]    Agreeable to Follow: pt has appt c PCP 09/27/17  Date/Time of Call:     Language/Communication Barrier:          No    Primary Diagnosis and Reason for Services:       Lower extremity weakness R29.898     Numbness and tingling of leg R20.0, R20.2       [employer] (Labs, Wounds, Infusions, etc.):    Additional Comments:  NO SOC CALL IS NEEDED  Pt refused AR.  Mother Otho Bellows is main support. Pt wants RW.    Discharge Date: 09/23/17  Referral Source (PACC/Hospital/Unit): Ladell Pier, RN  Referral Date: 09/25/18     Home Health face-to-face (FTF) Encounter (Order [id number])   Consult   Date: 09/26/2018  Department: [facility] Ordering/Authorizing: Tery Sanfilippo, MD   Order Information     Order Date/Time Release Date/Time Start Date/Time End Date/Time   09/26/18 12:22 PM None 09/26/18 12:22 PM 09/26/18 12:22 PM   Order Details     Frequency Duration Priority Order Class   Once 1 occurrence Routine Hospital Performed   Standing Order Information     Remaining Occurrences Interval Last Released      0/1 Once 09/26/2018            Provider Information     Ordering User Ordering Provider Authorizing Provider   Ladell Pier, RN Ashiny, Vonita Moss, MD Tery Sanfilippo, MD   Attending Provider(s) Admitting Provider PCP   Merrilee Seashore, MD; Zigmund Gottron, MD; Tery Sanfilippo, MD Zigmund Gottron, MD Tana Coast, MD   Verbal Order Info     Action Created on Order Mode Entered by Responsible  Provider Signed by Signed on   Ordering 09/26/18 1222 Telephone with Arbutus Leas, RN Ashiny, Vonita Moss, MD     Comments     PT/OT needed for evaluation, compensatory techniques education, endurance training, LE strengthening, neuromuscular reeducation    Front wheeled walker needed >99 months, NPI: [id number]  Abnormality of Gait R2.69         Order Questions     Question Answer Comment   Date of face-to-face (FTF) encounter: 09/25/2018    Medical conditions that necessitate Home Health care: B. Functional impairment due to recent hospitalization/procedure/treatment     E. Exacerbation of disease requiring follow up monitoring     F. New diagnosis & treatment requiring follow up monitoring and management    Clinical findings that support the need for Skilled Nursing. SN will: O. N/A    Clinical findings that support the need for Physical Therapy. PT will A. Evaluate and treat functional impairment and improve mobility     C. Educate on weight bearing status, stair/gait training, balance & coordination     E. Educate on functional mobility; bed, chair, sit, stand and transfer activities     F. Perform home safety assessment & develop safe in home exercise program     G. Implement activities to improve stance time, cadence & step length     H. Educate on the safe use of assistive device/ durable medical equipment    Clinical findings support the need for OT (needs SN/PT order).OT will A. Develop in home program to improve ability to perform ADLs     B. Develop restorative program to improve mobility and independence     C. Educate on recovery and maintenance skills    Clinical findings that support the need for SLP. ST will F. N/A    Per clinical findings, following services are medically necessary: PT     OT    Evidence this patient is homebound because: B. Profound weakness, poor balance/unsteady gait d/t illness/treatment/procedure     G. Fall risk due to impaired coordination, gait  and decreased balance     M. Unsteady gait, poor ambulation with history of falls    DME Rolling Walker    Other (please specify) PCP Lucretia Roers MD to follow in the community          Process Instructions     Please select Home Care Services medically necessary.     Based on the above findings, I certify that this patient is confined to the home and needs intermittent skilled  nursing care, physical therapry and / or speech therapy or continues to need occupational therapy. The patient is under my care, and I have initiated the establishment of the plan of care. This patient will be followed by a physician who will periodically review the plan of care.    Collection Information     Consult Order Info     ID Description Priority Start Date Start Time   [id number] Home Health face-to-face (FTF) Encounter Routine 09/26/2018 12:22 PM   Provider Specialty Referred to   ______________________________________ _____________________________________   Acknowledgement Info     For At Acknowledged By Acknowledged On   Placing Order 09/26/18 1222 Ladell Pier, RN 09/26/18 1222   Verbal Order Info     Action Created on Order Mode Entered by Responsible Provider Signed by Signed on   Ordering 09/26/18 1222 Telephone with Arbutus Leas, RN Ashiny, Vonita Moss, MD     Patient Information     Patient Name  Alexis Gentry, Alexis Gentry Sex  Female DOB  06-07-1996   Additional Information     Associated Reports External References   Priority and Order Details [employer]              [facility]  [facility]        Patient Name: Alexis Gentry, Alexis Gentry     MRN: [id number]     CSN: [id number]       Account Information    Hosp Acct #   [id number] Patient Class   Inpatient Service  Neurology Accommodation Code  Semi-Private     Admission Information    Admitting Physician:  Attending Physician: Zigmund Gottron, MD   Unit  [facility]* L&D Status     Admitting Diagnosis: Weakness of both lower extremities; Lower extremity  weakness Room / Bed  F740/F740.01 L&D  Last Menstrual Cycle  09/07/2018   Chief Complaint: Generalized weakness; Fa*     Admit Type:  Admit Date/Time:  Discharge Date/Time: Emergency  09/21/2018 / 1735  09/23/2018 / 1630 Length of Stay: 2 Days   L&D EDD   Estimated Date of Delivery: None noted.     Patient Information            Home Address: [address]  [place] [address] [address] Employer:  Employer Address:     ,     Main Phone: [phone] Employer Phone:    SSN: [id number]     DOB: 04/09/1996 (22 yrs)     Sex: Female Primary Care Physician: Tana Coast, MD   Marital Status: Single Referring Physician:       No ref. provider found   Race: Black or African American     Ethnicity: Non Hispanic/Latino     Emergency Contacts  Name Home Phone Work Phone Mobile Phone Relationship HA, SHANNAHAN [phone]  [phone] Mother         Guarantor Information    Guarantor Name: Alexis Gentry, Alexis Gentry ID: [id number]   Guarantor Relationship to Pt: Self Guarantor Type: Personal/Family   Guarantor DOB:   1996-04-20     Guarantor Address: [address]  [place], [place] [address]       Guarantor Home Phone: [phone] Guarantor Employer:     OTHER   Guarantor Work Phone:  Pharmacologist Phone:               Chief Technology Officer Name: [employer] Subscriber Name: [employer] Address:  [address] [address]  [place], [place] [address] Subscriber DOB: [birth year]     Subscriber ID: [id number]   Insurance Phone: [phone] Pt Relationship to Sub:   Self   Insurance ID:      Group Name:  Preauthorization #:    Group #:  Preauthorization Days:      Art gallery manager Name: - Subscriber Name:    Nurse, children's:     ,   Statistician DOB:      Subscriber ID:    Press photographer:  Pt Relationship to Sub:      Insurance ID:      Group Name:  Preauthorization #:    Group #:  Preauthorization Days:       [employer] Name: - Subscriber Name:    Community education officer Address:     ,   Statistician DOB:      Subscriber ID:    Press photographer:  Pt Relationship to Sub:      Insurance ID:      Group Name:  Preauthorization #:    Group #:  Preauthorization Days:        09/25/2018 5:28 PM

## 2018-09-25 NOTE — UM Notes (Addendum)
1/9 INPATIENT admission............Marland Kitchen    DX: BLE weakness.....................    22yo F, p/w 3 wks worsening weakness & numbness in BLE, 2 episodes of urine incontinence. Partial w/u in [place] included brain & spine MRI, LP unremarkable. Now in [place] w/mother d/t inability to ambulate independently. PCP rec for outpt PT unsuccessful & pt had another fall................................  -- signif ambulatory dysfunction noted. Urine incontinence, freq falls, rigidity..................................  -- Neuro: cranial nerves grossly intact, strength 5/5 in upper and 3/5 in distal and 4/5 in proximal lower extremities, sensation diminished below the knee, reflexes are exaggerated 2-3+ in LE  Nl reflexes UE..........................  PLAN:.......   Not likely Guillain-Barre given doesn't extent to UE, nl LP protein, can't r/o completely.   ? Idiopathic vs inflamm transverse myelitis though not flaccid paresis....................  NEURO consult,....  F/u spinal cord MRI & EMG for 1/10....  F/u ESR/CRP, TSH, vit B12, folate, syphilis serology... .  PT/OT...  DVT ppx: lovenox..................Marland Kitchen    ADDENDUM:   q4h neuro checks  Was started on gabapentin during this admit but [facility]'d d/t h/o suicidal ideation  ............  Pt mother w/ same symptoms but never given a dx....  PT rec AR but parent to bring pt home.....  On discharge: MEDS......  levonorgestrel-ethinyl estradiol,   Multivit & vit D

## 2018-09-26 NOTE — Progress Notes (Addendum)
[facility]  (P) [phone] (F) [phone]    Rolling walker order sent via [employer]..    09/27/2018 @ 9:19 AM     Rolling walker was delivered on 09/27/2018

## 2018-09-26 NOTE — Progress Notes (Signed)
[facility]  [facility]        Patient Name: Alexis Gentry, Alexis Gentry     MRN: [id number]     CSN: [id number]       Account Information    Hosp Acct #   [id number] Patient Class   Inpatient Service  Neurology Accommodation Code  Semi-Private     Admission Information    Admitting Physician:  Attending Physician: Zigmund Gottron, MD   Unit  [facility]* L&D Status     Admitting Diagnosis: Weakness of both lower extremities; Lower extremity weakness Room / Bed  F740/F740.01 L&D  Last Menstrual Cycle  09/07/2018   Chief Complaint: Generalized weakness; Fa*     Admit Type:  Admit Date/Time:  Discharge Date/Time: Emergency  09/21/2018 / 1735  09/23/2018 / 1630 Length of Stay: 2 Days   L&D EDD   Estimated Date of Delivery: None noted.     Patient Information            Home Address: [address]  Boneta Lucks [address] [address] Employer:  Employer Address:     ,     Main Phone: [phone] Employer Phone:    SSN: [id number]     DOB: [birth year] (22 yrs)     Sex: Female Primary Care Physician: Tana Coast, MD   Marital Status: Single Referring Physician:       No ref. provider found   Race: Black or African American     Ethnicity: Non Hispanic/Latino     Emergency Contacts  Name Home Phone Work Phone Mobile Phone Relationship SHERAL, PFAHLER [phone]  [phone] Mother         Guarantor Information    Guarantor Name: JIMESHA, RISING ID: [id number]   Guarantor Relationship to Pt: Self Guarantor Type: Personal/Family   Guarantor DOB:   [birth year]     Guarantor Address: [address]  [place], [place] [address]       Guarantor Home Phone: [phone] Guarantor Employer:     OTHER   Guarantor Work Phone:  Pharmacologist Phone:               [employer]    Insurance Name: [employer] Subscriber Name: [employer] Address:   [address] [address]  [place], [place] [address] Subscriber DOB: Sep 30, 1995     Subscriber ID:  [id number]   Insurance Phone: [phone] Pt Relationship to Sub:   Self   Insurance ID:      Group Name:  Preauthorization #:    Group #:  Preauthorization Days:      Art gallery manager Name: - Statistician Name:    Nurse, children's:     ,   Statistician DOB:      Subscriber ID:    Press photographer:  Pt Relationship to Sub:      Insurance ID:      Group Name:  Preauthorization #:    Group #:  Preauthorization Days:      [employer] Name: Scientist, clinical (histocompatibility and immunogenetics) Name:    Community education officer Address:     ,   Statistician DOB:      Subscriber ID:    Press photographer:  Pt Relationship to Sub:      Insurance ID:      Group Name:  Preauthorization #:    Group #:  Preauthorization Days:        09/26/2018 8:38 AM  Ht: 1.651 m (5\' 5" )   Wt: 56.2 kg (124 lb)   Admission Wt: 56.2 kg (124 lb)     Home Health face-to-face (FTF) Encounter (Order [id number])   Consult   Date: 09/25/2018 Department: [facility] Ordering/Authorizing: Tery Sanfilippo, MD   Order Information     Order Date/Time Release Date/Time Start Date/Time End Date/Time   09/25/18 05:28 PM None 09/25/18 05:03 PM 09/25/18 05:03 PM   Order Details     Frequency Duration Priority Order Class   Once 1 occurrence Routine Hospital Performed   Standing Order Information     Remaining Occurrences Interval Last Released      0/1 Once 09/25/2018            Provider Information     Ordering User Ordering Provider Authorizing Provider   Ladell Pier, RN Ashiny, Vonita Moss, MD Tery Sanfilippo, MD   Attending Provider(s) Admitting Provider PCP   Merrilee Seashore, MD; Zigmund Gottron, MD; Tery Sanfilippo, MD Zigmund Gottron, MD Tana Coast, MD   Verbal Order Info     Action Created on Order Mode Entered by Responsible Provider Signed by Signed on   Ordering 09/25/18 1728 Telephone with Arbutus Leas, RN Ashiny, Vonita Moss, MD Tery Sanfilippo, MD 09/25/18  1811   Comments     Skilled nurse needed for cardiopulmonary assessment, medication education  PT/OT needed for evaluation, compensatory techniques education, endurance training, LE strengthening, neuromuscular reeducation    Front wheeled walker needed >99 months, NPI: [id number]  Abnormality of Gait R2.69         Order Questions     Question Answer Comment   Date of face-to-face (FTF) encounter: 09/25/2018    Medical conditions that necessitate Home Health care: B. Functional impairment due to recent hospitalization/procedure/treatment     E. Exacerbation of disease requiring follow up monitoring     F. New diagnosis & treatment requiring follow up monitoring and management    Clinical findings that support the need for Skilled Nursing. SN will: C. Monitor for signs and symptoms of exacerbation of disease and management     D. Review medication reconciliation, manage and educate on use and side effects     G. Educate on new diagnosis, treatment & management to prevent re-hospitalization     H. Assess cardiopulmonary status and monitor for signs &symptoms of exacerbation    Clinical findings that support the need for Physical Therapy. PT will A. Evaluate and treat functional impairment and improve mobility     C. Educate on weight bearing status, stair/gait training, balance & coordination     E. Educate on functional mobility; bed, chair, sit, stand and transfer activities     F. Perform home safety assessment & develop safe in home exercise program     G. Implement activities to improve stance time, cadence & step length     H. Educate on the safe use of assistive device/ durable medical equipment    Clinical findings support the need for OT (needs SN/PT order).OT will A. Develop in home program to improve ability to perform ADLs     B. Develop restorative program to improve mobility and independence     C. Educate on recovery and maintenance skills    Clinical findings that support the need for SLP.  ST will F. N/A    Per clinical findings, following services are medically necessary: Skilled Nursing     PT  OT    Evidence this patient is homebound because: B. Profound weakness, poor balance/unsteady gait d/t illness/treatment/procedure     G. Fall risk due to impaired coordination, gait and decreased balance     M. Unsteady gait, poor ambulation with history of falls    DME Rolling Walker    Other (please specify) PCP Lucretia Roers MD to follow in the community          Process Instructions     Please select Home Care Services medically necessary.     Based on the above findings, I certify that this patient is confined to the home and needs intermittent skilled nursing care, physical therapry and / or speech therapy or continues to need occupational therapy. The patient is under my care, and I have initiated the establishment of the plan of care. This patient will be followed by a physician who will periodically review the plan of care.    Collection Information     Consult Order Info     ID Description Priority Start Date Start Time   [id number] Home Health face-to-face (FTF) Encounter Routine 09/25/2018 5:03 PM   Provider Specialty Referred to   ______________________________________ _____________________________________   Acknowledgement Info     For At Acknowledged By Acknowledged On   Placing Order 09/25/18 1728 Ladell Pier, RN 09/25/18 1728   Verbal Order Info     Action Created on Order Mode Entered by Responsible Provider Signed by Signed on   Ordering 09/25/18 1728 Telephone with Arbutus Leas, RN Ashiny, Vonita Moss, MD Tery Sanfilippo, MD 09/25/18 1811   Patient Information     Patient Name  Alexis Gentry Sex  Female DOB  December 15, 1995   Additional Information     Associated Reports External References   Priority and Order Details [employer]     Torrie Mayers, PT   Physical Therapist   Physical Medicine and Rehabilitation   PT Eval Note   Signed   Date of Service:  09/22/2018  10:56 AM   Creation Time:  09/22/2018 10:56 AM            Signed        Expand All Collapse All      [] Hide copied text    [] Hover for details  [facility]   Physical Therapy Evaluation     Patient: Alexis Gentry MRN#: [id number]   Unit: [facility] TOWER 7                    Bed: F740/F740.01    Post Acute Care Therapy Recommendations:   Discharge Recommendations: Acute Rehab((many progress to home))     Milestones to be reached to achieve recommendation: n/a  Anticipate achievement in n/a sessions    DME Recommended for Discharge: (TBD at rehab )    If Acute Rehab recommended discharge disposition is not available, patient will need hands on assist for all functional mobility with transport chair, shower chair and HHPT.     Therapy discharge recommendations may change with patient status.  Please refer to most recent note for up-to-date recommendations.    Assessment:   Significant Findings: notable BLE weakness     Dariyah Garduno is a 23 y.o. female admitted 09/21/2018.  Patient presents with significant LE weakness impairing safety, ease and independence with functional mobility. Pt still able to ambulate short distances in the room with slow discontinuous steps and frequent RLE buckling. MinA and RW required for stability. VC  for optimal sequencing with transfers and gait with appropriate carryover. Pt has sustained multiple falls since December when symptoms began and will benefit from continued acute PT services to maximize functional mobility prior to discharge. Will recommend acute rehab for improved strength, balance and endurance and safe sustainable discharge to home with decreased risk for falls.     Functional Limitations: Assessment: Decreased LE strength, Decreased endurance/activity tolerance, Gait impairment, Decreased balance, Decreased functional mobility, Decreased sensation    Therapy Diagnosis: Decreased functional mobility.     Rehabilitation Potential:  Good    Treatment Activities:   PT Evaluation , Bed mobility training, Transfer training , Gait training  and Therapeutic exercises     Educated the patient to role of physical therapy, plan of care, goals of therapy and safety with mobility and ADLs.    Plan:   Treatment/Interventions:   Treatment/Interventions: Exercise;Gait training;Neuromuscular re-education;Functional transfer training;LE strengthening/ROM;Endurance training;Patient/family training;Equipment eval/education;Bed mobility;Compensatory technique education;Continued evaluation     PT Frequency: 3-4x/wk     Risks/Benefits/POC Discussed with Pt/Family: With patient/family        Precautions and Contraindications:   Weight bearing: No Restrictions    Falls     Consult received for Alexis Gentry for PT Evaluation and Treatment.  Patients medical condition is appropriate for Physical Therapy intervention at this time.    History of Present Illness:   Martie Muhlbauer is a 23 y.o. female admitted on 09/21/2018 with "no significant past medical history who presented to the hospital due to a fall today while she was using walker for walking.    Patient problem started on December 18 while she was in [place] at her work and she had a fall with lower extremity weakness, she was admitted to the hospital in [place] from 21st-20 4 December with work-ups including brain and spine MRI, LP was done.  Patient's mother has the report of MRI and LP on her cell phone which are unremarkable.  Patient came here to [place] and stayed with her mom given she was not able to ambulate independently, as per her mother her symptom gradually worsened, patient reports that she had 2 episodes of urine incontinency but no fecal incontinency, patient reports that she did not feel her urine is coming out.  She has been seen by her PCP recommended to see a neurologist as an outpatient and go through physical therapy which did not work, she came today because she  had another fall.    Mom reports she had a viral URI around Thanksgiving time but no more recent viral illness recently.    Of note patient's mom reports that she had the same problem at the same age when she was young and she underwent massive work-ups, still following with [facility] clinic, no significant reason was found she has been given several courses of steroid treatment in the past, patient reports that her symptom resolved after her second pregnancy" per H&P     Medical Diagnosis: Weakness of both lower extremities [R29.898]    Past Medical/Surgical History:  PastMedicalHistory   History reviewed. No pertinent past medical history.     PastSurgicalHistory         Past Surgical History:   Procedure Laterality Date    APPENDECTOMY      CHOLECYSTECTOMY      WISDOM TOOTH EXTRACTION            Imaging/Tests/Labs:  Mri Lumbar Spine W Wo Contrast    Result Date: 09/22/2018   Unremarkable  study. 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 Mother home during the day.     Subjective:   "It feels weird" re BLE  Patient is agreeable to participation in the therapy session. Nursing clears patient for therapy.     Patient Goal: Get better    Pain Assessment: no/denies pain     Objective:   Patient is in bed with Telemetry, access in place.    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     Inspection/Posture  R hand resting tremors     Musculoskeletal Examination  Gross ROM  Right Upper Extremity ROM: WFL   Left Upper Extremity ROM: WFL  Right Lower Extremity ROM: WFL - diminished DF and hip flex  Left Lower Extremity ROM: WFL - diminished DF and hip flex     Gross Strength   Right Upper extremity: 3+/5  Left Upper Extremity: 3+/5    Lower Extremity  Hip flex Hip abd/add Knee ext Knee flex  Ankle DF/PF   RIGHT 3+/5 3/5 , 2+/5 3+/5 3+/5 3-/5, 2-/5   LEFT 3+/5 3/5, 2+/5 4/5 3+/5 3-/5, 2-/5     Functional Mobility  Supine to sit: CGA/SBA  Sit to supine: CGA  Sit to stand: Min Assist  with Rolling walker   Stand to sit: Min Assist  with Rolling walker   Increased time/effort and compensation via BUE support. Difficulty attaining full terminal knee extension.   Cueing required: VC/TC for AD management , upright posture, sequencing , LE activation, hand placement,terminal knee extension and anterior weight shifting.     PMP - Progressive Mobility Protocol   PMP Activity: Step 6 - Walks in Room  Distance Walked (ft) (Step 6,7): 30 Feet     Ambulation  Distance: 30 ft   Device: Rolling walker   Assist level: Min Assist   Pattern: decreased cadence, decreased step length, decreased foot clearance, decreased heel-toe gait, step through, decreased RLE stance time with intermittent knee buckling.   Cueing required: VC/TC for AD management , upright posture, increased step length , increased foot clearance , appropriate proximity to AD, sequencing  and LE activation    Balance  Static sitting: Good  Dynamic  sitting: Good-  Static Standing: Fair   Dynamic standing: Fair     Participation and Activity Tolerance  Participation effort: Good  Patient endurance: Good    Patient left in bed with call bell within reach, all needs met and all questions answered.  SCDs off  as ofund, fall mat in place, bed alarm on, chair alarm n/a   RN notified of session outcome and patient response.     Goals  Goal Formulation: With patient/family  Time for Goal Acheivement: 5 visits  Goals: Select goal  Pt Will Go Supine To Sit: with supervision, to maximize functional mobility and independence  Pt Will Perform Sit to Stand: with supervision, to maximize functional mobility and independence(with LRAD )  Pt Will Ambulate: 101-150 feet, with supervision, to maximize functional mobility and independence(with LRAD )      Torrie Mayers, DPT, [phone]      Time of Treatment  PT Received On: 09/22/18  Start Time: 1017  Stop Time: 1100  Time Calculation (min): 43 min                Routing History                                                  Zigmund Gottron, MD   Physician   Internal Medicine   H&P   Addendum   Date of Service:  09/21/2018 8:19 PM   Creation Time:  09/21/2018 8:19 PM            Addendum        Expand All Collapse All      [] Hide copied text    [] Hover for details      ADMISSION HISTORY AND PHYSICAL EXAM    Date Time: 09/21/18 8:20 PM  Patient Name: Alexis Gentry  Attending Physician: Merrilee Seashore, MD  Primary Care Physician: Tana Coast, MD    CC: LE weakness      Assessment:   22 year old female with no significant past medical history presented to the hospital with chief complaint of 3 weeks gradually and slowly worsening lower extremity weakness, numbness and 2 episodes of urine incontinency.    Plan:   #Acute/subacute bilateral lower extremity weakness  Started 3 weeks ago, partial work-ups back in [place] including brain and spine MRI, LP unremarkable.  Gradually worsening symptom, significant  ambulatory dysfunction, twice urinary incontinency, frequent falls.rigidity noted on exam.  Positive family history although its vague and unclear what was the exact diagnosis for her mom.  Less likely to be Guillain-Barr given her weakness did not extend to her upper extremity also had a negative LP with normal protein, cannot rule it out completely.  Possibility of idiopathic versus inflammatory transverse myelitis although she does not have flaccid paresis.    -Neurology was consulted from ED, will see patient tomorrow.  -Follow-up spinal cord MRI, EMG  -Appreciate neurology recommendation for further imaging and work-ups  -Follow-up ESR/CRP, vitamin B12, folate, syphilis serology.  -PT and OT    Patient is full code  Fall precautions  Regular diet  DVT P PX: Lovenox  Disposition: (Please see PAF column for Expected D/C Date)   Today's date: 09/21/2018   Admit Date: 09/21/2018  5:35 PM  Service status: Observation  Clinical Milestones: Subacute lower extremity weakness  Anticipated discharge needs: Pending neurology consult and neurology's work-up    History of Presenting Illness:   Leelah Hanna is a 23 y.o. female with no significant past medical history who presented to the hospital due to a fall today while she was using walker for walking.    Patient problem started on December 18  while she was in [place] at her work and she had a fall with lower extremity weakness, she was admitted to the hospital in [place] from 21st-20 4 December with work-ups including brain and spine MRI, LP was done.  Patient's mother has the report of MRI and LP on her cell phone which are unremarkable.  Patient came here to [place] and stayed with her mom given she was not able to ambulate independently, as per her mother her symptom gradually worsened, patient reports that she had 2 episodes of urine incontinency but no fecal incontinency, patient reports that she did not feel her urine is coming out.  She has been  seen by her PCP recommended to see a neurologist as an outpatient and go through physical therapy which did not work, she came today because she had another fall.    Mom reports she had a viral URI around Thanksgiving time but no more recent viral illness recently.    Of note patient's mom reports that she had the same problem at the same age when she was young and she underwent massive work-ups, still following with [facility] clinic, no significant reason was found she has been given several courses of steroid treatment in the past, patient reports that her symptom resolved after her second pregnancy.      Past Medical History:   PastMedicalHistory   History reviewed. No pertinent past medical history.       Available old records reviewed, including: Patient documents and chart review    Past Surgical History:     PastSurgicalHistory   Past Surgical History:   Procedure Laterality Date    APPENDECTOMY      CHOLECYSTECTOMY      WISDOM TOOTH EXTRACTION            Family History:   Positive for her mother having same symptoms with bilateral lower extremity weakness at young age.    Social History:     Social History         Tobacco Use   Smoking Status Never Smoker   Smokeless Tobacco Former Neurosurgeon     Social History         Substance and Sexual Activity   Alcohol Use No     Social History         Substance and Sexual Activity   Drug Use No       Allergies:          Allergies   Allergen Reactions    Penicillins Shortness Of Breath and Rash       Medications:           Home Medications           Med List Status:  In Progress Set By: Pia Mau, RN at 09/21/2018  6:09 PM                levonorgestrel-ethinyl estradiol (AVIANE,ALESSE,LESSINA) 0.1-20 MG-MCG per tablet     Take 1 tablet by mouth daily.     Multiple Vitamins-Minerals (MULTIVITAMIN WITH MINERALS) tablet     Take 1 tablet by mouth daily            Method by which medications were confirmed on admission:  Patient and family report, patient has an IUD for preventing pregnancy    Review of Systems:   All other systems were reviewed and are negative except: HPI    Physical Exam:     Patient Vitals for the past 24 hrs:  BP Temp Pulse Resp SpO2 Height Weight   09/21/18 1438 119/56 98.6 F (37 C) 94 20 100 % 1.651 m (5\' 5" ) 56.2 kg (124 lb)   09/21/18 1421   95 16 97 %       Body mass index is 20.63 kg/m.  No intake or output data in the 24 hours ending 09/21/18 2020    General: awake, alert, oriented x 3; no acute distress.  HEENT: perrla, eomi, sclera anicteric  oropharynx clear without lesions, mucous membranes moist  Neck: supple, no lymphadenopathy, no thyromegaly, no JVD, no carotid bruits  Cardiovascular: regular rate and rhythm, no murmurs, rubs or gallops  Lungs: clear to auscultation bilaterally, without wheezing, rhonchi, or rales  Abdomen: soft, non-tender, non-distended; no palpable masses, no hepatosplenomegaly, normoactive bowel sounds, no rebound or guarding  Extremities: no clubbing, cyanosis, or edema  Neuro: cranial nerves grossly intact, strength 5/5 in upper and 3/5 in distal and 4/5 in proximal lower extremities, sensation diminished below the knee, reflexes are exaggerated 2-3+ in lower extremities  Normal reflexes upper extremities.  Skin: no rashes or lesions noted        Labs:     Labsinthelast24hours   Results     Procedure Component Value Units Date/Time    Comprehensive metabolic panel [[id number]] Collected:  09/21/18 1558    Specimen:  Blood Updated:  09/21/18 1634     Glucose 71 mg/dL      BUN 01.0 mg/dL      Creatinine 0.8 mg/dL      Sodium 272 mEq/L      Potassium 4.0 mEq/L      Chloride 105 mEq/L      CO2 22 mEq/L      Calcium 9.4 mg/dL      Protein, Total 8.2 g/dL      Albumin 4.6 g/dL      AST (SGOT) 15 U/L      ALT 17 U/L      Alkaline Phosphatase 95 U/L      Bilirubin, Total 1.1 mg/dL      Globulin 3.6 g/dL       Albumin/Globulin Ratio 1.3    Narrative:       Replace urinary catheter prior to obtaining the urine culture  if it has been in place for greater than or equal to 14  days:->N/A No Foley  Indications for U/A Reflex to Micro - Reflex to  Culture:->Suprapubic Pain/Tenderness or Dysuria    Lipase [[id number]] Collected:  09/21/18 1558    Specimen:  Blood Updated:  09/21/18 1634     Lipase 29 U/L     Narrative:       Replace urinary catheter prior to obtaining the urine culture  if it has been in place for greater than or equal to 14  days:->N/A No Foley  Indications for U/A Reflex to Micro - Reflex to  Culture:->Suprapubic Pain/Tenderness or Dysuria    GFR [[id number]] Collected:  09/21/18 1558     Updated:  09/21/18 1634     EGFR >60.0    Narrative:       Replace urinary catheter prior to obtaining the urine culture  if it has been in place for greater than or equal to 14  days:->N/A No Foley  Indications for U/A Reflex to Micro - Reflex to  Culture:->Suprapubic Pain/Tenderness or Dysuria    UA Reflex to Micro - Reflex to Culture [[id number]]  (Abnormal) Collected:  09/21/18 1558     Updated:  09/21/18  1622     Urine Type Urine, Clean Ca     Color, UA Yellow     Clarity, UA Clear     Specific Gravity UA 1.027     Urine pH 5.0     Leukocyte Esterase, UA Negative     Nitrite, UA Negative     Protein, UR Negative     Glucose, UA Negative     Ketones UA Negative     Urobilinogen, UA Normal mg/dL      Bilirubin, UA Negative     Blood, UA Small     RBC, UA 3 - 5 /hpf      WBC, UA 0 - 5 /hpf      Squamous Epithelial Cells, Urine 0 - 5 /hpf     Narrative:       Replace urinary catheter prior to obtaining the urine culture  if it has been in place for greater than or equal to 14  days:->N/A No Foley  Indications for U/A Reflex to Micro - Reflex to  Culture:->Suprapubic Pain/Tenderness or Dysuria    CBC and differential [[id number]] Collected:  09/21/18 1558    Specimen:   Blood Updated:  09/21/18 1619     WBC 4.91 x10 3/uL      Hgb 13.0 g/dL      Hematocrit 54.0 %      Platelets 339 x10 3/uL      RBC 4.42 x10 6/uL      MCV 90.5 fL      MCH 29.4 pg      MCHC 32.5 g/dL      RDW 13 %      MPV 10.2 fL      Neutrophils 34.9 %      Lymphocytes Automated 54.6 %      Monocytes 7.7 %      Eosinophils Automated 2.2 %      Basophils Automated 0.4 %      Immature Granulocyte 0.2 %      Nucleated RBC 0.0 /100 WBC      Neutrophils Absolute 1.71 x10 3/uL      Abs Lymph Automated 2.68 x10 3/uL      Abs Mono Automated 0.38 x10 3/uL      Abs Eos Automated 0.11 x10 3/uL      Absolute Baso Automated 0.02 x10 3/uL      Absolute Immature Granulocyte 0.01 x10 3/uL      Absolute NRBC 0.00 x10 3/uL     Narrative:       Replace urinary catheter prior to obtaining the urine culture  if it has been in place for greater than or equal to 14  days:->N/A No Foley  Indications for U/A Reflex to Micro - Reflex to  Culture:->Suprapubic Pain/Tenderness or Dysuria    Urine HCG, POC/ Qualitative [[id number]] Collected:  09/21/18 1556    Specimen:  Urine Updated:  09/21/18 1602     POCT QC Pass     POCT Pregnancy HCG Test, UR Negative     Comment: Negative Value is Normal in Healthy Males or Healthy non-pregnant Females             Imaging personally reviewed, including: No results found.      Safety Checklist  DVT prophylaxis:  CHEST guideline (See page [phone]) Chemical   Foley:  [facility] Foley protocol Not present   IVs:  Peripheral IV   PT/OT: Not needed   Daily CBC & or Chem ordered:  SHM/ABIM guidelines (see #5) Yes, due to clinical and lab instability   Reference for approximate charges of common labs: CBC auto diff - $76   BMP - $99   Mg - $79    Signed by: Zigmund Gottron, MD   UR:KYHCWC, Suzette Battiest, MD            Revision History                              Routing History

## 2018-10-04 ENCOUNTER — Ambulatory Visit (INDEPENDENT_AMBULATORY_CARE_PROVIDER_SITE_OTHER): Payer: Medicaid Other | Admitting: Neurology

## 2018-10-04 ENCOUNTER — Inpatient Hospital Stay (HOSPITAL_BASED_OUTPATIENT_CLINIC_OR_DEPARTMENT_OTHER): Payer: Medicaid Other | Admitting: Neurology

## 2018-10-04 ENCOUNTER — Encounter (HOSPITAL_BASED_OUTPATIENT_CLINIC_OR_DEPARTMENT_OTHER): Payer: Self-pay | Admitting: Neurology

## 2018-10-04 ENCOUNTER — Ambulatory Visit
Admission: RE | Admit: 2018-10-04 | Discharge: 2018-10-04 | Disposition: A | Discharge status: Home / Self Care | Payer: Medicaid Other | Source: Ambulatory Visit | Attending: Neurology | Admitting: Neurology

## 2018-10-04 VITALS — BP 120/69 | HR 94 | Resp 13

## 2018-10-04 DIAGNOSIS — R29898 Other symptoms and signs involving the musculoskeletal system: Secondary | ICD-10-CM

## 2018-10-04 DIAGNOSIS — R5382 Chronic fatigue, unspecified: Secondary | ICD-10-CM

## 2018-10-04 DIAGNOSIS — M792 Neuralgia and neuritis, unspecified: Secondary | ICD-10-CM

## 2018-10-04 DIAGNOSIS — G2582 Stiff-man syndrome: Secondary | ICD-10-CM

## 2018-10-04 MED ORDER — PREGABALIN 50 MG PO CAPS
50.00 mg | ORAL_CAPSULE | Freq: Two times a day (BID) | ORAL | 5 refills | Status: DC
Start: 2018-10-04 — End: 2019-01-24

## 2018-10-04 NOTE — Progress Notes (Signed)
[facility]    Date of Gentry: October 04, 2018    Subjective:      Patient ID:  Patient's Name: [facility]  Date of Birth: February 16, 1996  MRN: [id number]    HPI  The patient reports that she developed weakness in the right foot on August 29, 2018.  Her weakness spread to both feet, and became progressively worse.  She fell down due to leg weakness.  She reports no associated injuries or exacerbation of significant lower back pain.  She had had symptoms of upper respiratory infection around Thanksgiving.  She went to urgent care, and she was referred to the emergency room on August 30, 2018.  She did not have double vision, eyelid droopiness, or shortness of breath.  She has not had neck pain.  She had initial CT scan of the head, which was unrevealing.  She underwent lumbar puncture for the possibility of Guillain-Barr syndrome, which was unrevealing.  She also had MRI of the brain and cervical spine on September 02, 2018, which were unrevealing.    She was transferred to [facility].  She had a nerve conduction and EMG study, which was unrevealing.  Her work-up was unrevealing after fact Gentry.  She has noted a burning sensation on her face and arms since February 2019, which typically lasts from 45 to 60 minutes.  She also has been feeling significant stiffness in both legs, which makes it more difficult to walk.  She has been having pain in her thigh muscles, which can get to 7/10 in intensity.  She tried gabapentin, which did not improve the nerve pain.  She might feel better some days, but then she feels worse.  She tends to feel more weaker early in the morning.  She does not get weaker or more fatigued as the day goes on.  She does not have muscle fatigability while climbing stairs.  There is no exacerbation of symptoms with exposure to cold temperatures or after having a high carbohydrate meal.  She reports had one episode of urinary incontinence initially.    Of note, her  mother had similar symptoms when she was the patient's current age.  She has been evaluated at [facility], with no specific diagnosis.    The following portions of the patient's history were reviewed and updated as appropriate: allergies, current medications, past family history, past medical history, past social history, past surgical history and problem list.    Review of Systems   Constitution: Negative for fever.   HENT: Negative for hearing loss.    Respiratory: Positive for chest tightness. Negative for shortness of breath.    Endocrine: Negative for diabetes problem.   Hematologic/Lymphatic: No easy bleeding.   Skin: Negative for rash.   Musculoskeletal: Positive for back pain. Negative for neck pain.   Gastrointestinal: Negative for diarrhea.   Neurological: Positive for focal weakness, paresthesias and tremors. Negative for seizures.   Psychiatric/Behavioral: Negative for memory loss. The patient does not have insomnia.    Allergic/Immunologic: Negative for food allergies.     History reviewed. No pertinent past medical history.  Current Outpatient Medications   Medication Sig    GABAPENTIN PO Take by mouth    levonorgestrel-ethinyl estradiol (AVIANE,ALESSE,LESSINA) 0.1-20 MG-MCG per tablet Take 1 tablet by mouth daily.    Multiple Vitamins-Minerals (MULTIVITAMIN WITH MINERALS) tablet Take 1 tablet by mouth daily    vitamin D, cholecalciferol, 10 MCG (400 UNIT) tablet Take 10 mcg by mouth  daily    pregabalin (LYRICA) 50 MG capsule Take 1 capsule (50 mg total) by mouth 2 (two) times daily       Objective:   Neurological Examination:  Vitals:    10/04/18 1231   BP: 120/69   BP Site: Left arm   Patient Position: Sitting   Pulse: 94   Resp: 13     [facility] is fully awake, alert, and oriented.  She is well related, interactive, answers questions, and follows three-step commands appropriately. Speech is clear. No skin rashes are noted. Cranial nerves II through XII are grossly  intact. There is no diplopia, nystagmus, eyelid ptosis, facial weakness, tongue atrophy, fasciculations or tongue weakness.   There is no focal muscle atrophy in the extremities. There are no fasciculations in the arms or legs. Muscle strength is normal in proximal and distal muscles of the arms bilaterally.  She has mild weakness in the iliopsoas muscles, tibialis anterior, and EHL muscles bilaterally.  She has mild postural tremors in both hands.  There is no cogwheel rigidity at the wrists.  There is no significant spasticity in the legs at rest, but she becomes very spastic as she tries to take steps.  She takes short steps with a wide base.  Deep tendon reflexes are symmetrically present at 2+ in the arms, knees, and ankles.  There is no Babinski sign.  There is no ankle clonus.  There is no asterixis.  Sensation to pinprick is mildly decreased in the right lower extremity.  Coordination is intact on finger-to-nose to finger testing bilaterally.     Data Review:    A nerve conduction and EMG study from September 22, 2018 is normal.  There is no evidence of sensorimotor peripheral neuropathy, myopathy, or lumbar radiculopathy.    Admission on 09/21/2018, Discharged on 09/23/2018   Component Date Value Ref Range Status    WBC 09/21/2018 4.91  3.10 - 9.50 x10 3/uL Final    Hgb 09/21/2018 13.0  11.4 - 14.8 g/dL Final    Hematocrit 45/40/9811 40.0  34.7 - 43.7 % Final    Platelets 09/21/2018 339  142 - 346 x10 3/uL Final    RBC 09/21/2018 4.42  3.90 - 5.10 x10 6/uL Final    MCV 09/21/2018 90.5  78.0 - 96.0 fL Final    MCH 09/21/2018 29.4  25.1 - 33.5 pg Final    MCHC 09/21/2018 32.5  31.5 - 35.8 g/dL Final    RDW 91/47/8295 13  11 - 15 % Final    MPV 09/21/2018 10.2  8.9 - 12.5 fL Final    Neutrophils 09/21/2018 34.9  None % Final    Lymphocytes Automated 09/21/2018 54.6  None % Final    Monocytes 09/21/2018 7.7  None % Final    Eosinophils Automated 09/21/2018 2.2  None % Final    Basophils Automated  09/21/2018 0.4  None % Final    Immature Granulocyte 09/21/2018 0.2  None % Final    Nucleated RBC 09/21/2018 0.0  0.0 - 0.0 /100 WBC Final    Neutrophils Absolute 09/21/2018 1.71  1.10 - 6.33 x10 3/uL Final    Abs Lymph Automated 09/21/2018 2.68  0.42 - 3.22 x10 3/uL Final    Abs Mono Automated 09/21/2018 0.38  0.21 - 0.85 x10 3/uL Final    Abs Eos Automated 09/21/2018 0.11  0.00 - 0.44 x10 3/uL Final    Absolute Baso Automated 09/21/2018 0.02  0.00 - 0.08 x10 3/uL Final  Absolute Immature Granulocyte 09/21/2018 0.01  0.00 - 0.07 x10 3/uL Final    Absolute NRBC 09/21/2018 0.00  0.00 - 0.00 x10 3/uL Final    Glucose 09/21/2018 71  70 - 100 mg/dL Final    Comment: ADA guidelines for diabetes mellitus:  Fasting:  Equal to or greater than 126 mg/dL  Random:   Equal to or greater than 200 mg/dL      BUN 16/06/9603 54.0  7.0 - 19.0 mg/dL Final    Creatinine 98/07/9146 0.8  0.6 - 1.0 mg/dL Final    Sodium 82/95/6213 136  136 - 145 mEq/L Final    Potassium 09/21/2018 4.0  3.5 - 5.1 mEq/L Final    Chloride 09/21/2018 105  100 - 111 mEq/L Final    CO2 09/21/2018 22  22 - 29 mEq/L Final    Calcium 09/21/2018 9.4  8.5 - 10.5 mg/dL Final    Protein, Total 09/21/2018 8.2  6.0 - 8.3 g/dL Final    Albumin 08/65/7846 4.6  3.5 - 5.0 g/dL Final    AST (SGOT) 96/29/5284 15  5 - 34 U/L Final    ALT 09/21/2018 17  0 - 55 U/L Final    Alkaline Phosphatase 09/21/2018 95  37 - 106 U/L Final    Bilirubin, Total 09/21/2018 1.1  0.2 - 1.2 mg/dL Final    Globulin 13/24/4010 3.6  2.0 - 3.6 g/dL Final    Albumin/Globulin Ratio 09/21/2018 1.3  0.9 - 2.2 Final    Urine Type 09/21/2018 Urine, Clean Ca   Final    Color, UA 09/21/2018 Yellow  Colorless - Yellow Final    Clarity, UA 09/21/2018 Clear  Clear - Hazy Final    Specific Gravity UA 09/21/2018 1.027  1.001 - 1.035 Final    Urine pH 09/21/2018 5.0  5.0 - 8.0 Final    Leukocyte Esterase, UA 09/21/2018 Negative  Negative Final    Nitrite, UA 09/21/2018 Negative   Negative Final    Protein, UR 09/21/2018 Negative  Negative Final    Glucose, UA 09/21/2018 Negative  Negative Final    Ketones UA 09/21/2018 Negative  Negative Final    Urobilinogen, UA 09/21/2018 Normal  0.2 - 2.0 mg/dL Final    Bilirubin, UA 09/21/2018 Negative  Negative Final    Blood, UA 09/21/2018 Small* Negative Final    RBC, UA 09/21/2018 3 - 5  0 - 5 /hpf Final    Comment: Some specialty practices may define 3-5 RBC/hpf as  microscopic hematuria.      WBC, UA 09/21/2018 0 - 5  0 - 5 /hpf Final    Comment: Patient without significant pyuria(ie. >10 WBC/HPF).  Urine culture not indicated      Squamous Epithelial Cells, Urine 09/21/2018 0 - 5  0 - 25 /hpf Final    POCT QC 09/21/2018 Pass   Final    POCT Pregnancy HCG Test, UR 09/21/2018 Negative  Negative Final    Comment: 09/21/2018 Negative Value is Normal in Healthy Males or Healthy non-pregnant Females   Final    Lipase 09/21/2018 29  8 - 78 U/L Final    EGFR 09/21/2018 >60.0   Final    Comment: Disease State Reference Ranges:    Chronic Kidney Disease; < 60 ml/min/1.73 sq.m    Kidney Failure; < 15 ml/min/1.73 sq.m    [Calculated using IDMS-Traceable MDRD equation (based on    gender, age and black vs. non-black race) recommended by    [employer] Disease Education Program.  No data    available for non-white, non-black race.]  GFR estimates are unreliable in patients with:    Rapidly changing kidney function or recent dialysis    Extreme age, body size or body composition(obesity,    severe malnutrition). Abnormal muscle mass (limb    amputation, muscle wasting). In these patients,    alternative determinations of GFR should be obtained.      Phosphorus 09/22/2018 4.3  2.3 - 4.7 mg/dL Final    Magnesium 16/06/9603 1.9  1.6 - 2.6 mg/dL Final    PT 54/05/8118 14.3  12.6 - 15.0 sec Final    PT INR 09/22/2018 1.1  0.9 - 1.1 Final    Comment: Recommended Ranges for Protime INR:    2.0-3.0 for most medical and surgical thromboembolic states     2.5-3.5 for artificial heart valves  INR result may not represent exact Warfarin dosing level during  the transition period from Heparin to Warfarin therapy.  Results should be interpreted based on current anticoagulant  therapy and patient's clinical presentation.      PTT 09/22/2018 37  23 - 37 sec Final    Comment: In vivo therapeutic range of heparin (0.3 - 0.7 IU/mL)  correlate with the following APTT times: 64 - 102 seconds.  Results should be interpreted based on current anticoagulant  therapy and patients clinical presentation.      Urine Type 09/21/2018 Clean Catch   Final    Color, UA 09/21/2018 Yellow  Colorless - Yellow Final    Clarity, UA 09/21/2018 Clear  Clear - Hazy Final    Specific Gravity UA 09/21/2018 1.024  1.001 - 1.035 Final    Urine pH 09/21/2018 6.0  5.0 - 8.0 Final    Leukocyte Esterase, UA 09/21/2018 Negative  Negative Final    Nitrite, UA 09/21/2018 Negative  Negative Final    Protein, UR 09/21/2018 Negative  Negative Final    Glucose, UA 09/21/2018 Negative  Negative Final    Ketones UA 09/21/2018 Negative  Negative Final    Urobilinogen, UA 09/21/2018 Normal  0.2 - 2.0 mg/dL Final    Bilirubin, UA 09/21/2018 Negative  Negative Final    Blood, UA 09/21/2018 Negative  Negative Final    Comment: NOTE:  URINE CHEMISTRY INDICATORS ARE NEGATIVE.  URINE         MICROSCOPIC NOT INDICATED.      Glucose 09/22/2018 83  70 - 100 mg/dL Final    Comment: ADA guidelines for diabetes mellitus:  Fasting:  Equal to or greater than 126 mg/dL  Random:   Equal to or greater than 200 mg/dL      BUN 14/78/2956 21.3  7.0 - 19.0 mg/dL Final    Creatinine 08/65/7846 0.7  0.6 - 1.0 mg/dL Final    Calcium 96/29/5284 9.1  8.5 - 10.5 mg/dL Final    Sodium 13/24/4010 135* 136 - 145 mEq/L Final    Potassium 09/22/2018 3.8  3.5 - 5.1 mEq/L Final    Chloride 09/22/2018 106  100 - 111 mEq/L Final    CO2 09/22/2018 22  22 - 29 mEq/L Final    WBC 09/22/2018 4.91  3.10 - 9.50 x10 3/uL Final     Hgb 09/22/2018 11.5  11.4 - 14.8 g/dL Final    Hematocrit 27/25/3664 35.5  34.7 - 43.7 % Final    Platelets 09/22/2018 316  142 - 346 x10 3/uL Final    RBC 09/22/2018 4.03  3.90 - 5.10 x10 6/uL Final  MCV 09/22/2018 88.1  78.0 - 96.0 fL Final    MCH 09/22/2018 28.5  25.1 - 33.5 pg Final    MCHC 09/22/2018 32.4  31.5 - 35.8 g/dL Final    RDW 16/06/9603 13  11 - 15 % Final    MPV 09/22/2018 10.1  8.9 - 12.5 fL Final    Neutrophils 09/22/2018 31.7  None % Final    Lymphocytes Automated 09/22/2018 58.0  None % Final    Monocytes 09/22/2018 7.5  None % Final    Eosinophils Automated 09/22/2018 2.0  None % Final    Basophils Automated 09/22/2018 0.6  None % Final    Immature Granulocyte 09/22/2018 0.2  None % Final    Nucleated RBC 09/22/2018 0.0  0.0 - 0.0 /100 WBC Final    Neutrophils Absolute 09/22/2018 1.55  1.10 - 6.33 x10 3/uL Final    Abs Lymph Automated 09/22/2018 2.85  0.42 - 3.22 x10 3/uL Final    Abs Mono Automated 09/22/2018 0.37  0.21 - 0.85 x10 3/uL Final    Abs Eos Automated 09/22/2018 0.10  0.00 - 0.44 x10 3/uL Final    Absolute Baso Automated 09/22/2018 0.03  0.00 - 0.08 x10 3/uL Final    Absolute Immature Granulocyte 09/22/2018 0.01  0.00 - 0.07 x10 3/uL Final    Absolute NRBC 09/22/2018 0.00  0.00 - 0.00 x10 3/uL Final    Vitamin B-12 09/22/2018 706  211 - 911 pg/mL Final    Folate 09/22/2018 10.5  See below ng/mL Final    Comment: Deficient    : <3.5 ng/mL  Intermediate : 3.5 - 5.4 ng/mL  Normal       : Greater Than 5.4 ng/mL      Syphilis Screen IgG and IgM 09/22/2018 Nonreactive   Final    Comment: If Syphilis screen is Reactive and RPR is Non-Reactive,  sample will be sent out for confirmation.      Sed Rate 09/22/2018 34* 0 - 20 mm/Hr Final    C-Reactive Protein 09/22/2018 <0.1  0.0 - 0.8 mg/dL Final    HIV Ag/Ab, 4th Generation 09/22/2018 Non-Reactive  Non-Reactive Final    Comment: A non-reactive HIV Ag/Ab result does not exclude HIV infection since the  time frame  for seroconversion is variable.  If acute HIV infection is  suspected, HIV-1 RNA Qualitative TMA testing is recommended.      Hemolysis Index 09/22/2018 12  0 - 18 Final    EGFR 09/22/2018 >60.0   Final    Comment: Disease State Reference Ranges:    Chronic Kidney Disease; < 60 ml/min/1.73 sq.m    Kidney Failure; < 15 ml/min/1.73 sq.m    [Calculated using IDMS-Traceable MDRD equation (based on    gender, age and black vs. non-black race) recommended by    [employer] Kidney Disease Education Program. No data    available for non-white, non-black race.]  GFR estimates are unreliable in patients with:    Rapidly changing kidney function or recent dialysis    Extreme age, body size or body composition(obesity,    severe malnutrition). Abnormal muscle mass (limb    amputation, muscle wasting). In these patients,    alternative determinations of GFR should be obtained.      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 This indicates presumptive  evidence of probable exposure. As recommended by the Food and  Drug Aministration(FDA), all samples with positive or  equivocal results in a B. burgdorferi antibody screening  will be tested by [employer]. Positive or  equivocal screening test results should not be  interpreted as truly positive until verified as such  using a supplemental assay and clinical findings.         MRI of the brain, cervical, thoracic, and lumbar spine from September 02, 2018 was normal.  There was no evidence of any white matter demyelination or central canal stenosis.      MRI  Lumbar Spine W WO Contrast [[id number]] (Order [id number])   Status: Final result   Study Result     HISTORY: Lower extremity weakness and numbness. Paralysis.    COMPARISON: None.    TECHNIQUE: Multiplanar imaging of the lumbar spine was performed before  and after the intravenous administration of 5.6cc Gadavist   contrast.         FINDINGS: There is a normal lumbar lordosis. Vertebral body and disc  space heights are within normal limits. Bone marrow signal appears  normal. The conus medullaris appears normal, terminating at L1-L2. No  abnormal enhancement is seen. No significant disc bulge/herniation or  stenosis is identified.    IMPRESSION:    Unremarkable study.    Leandro Reasoner, MD   09/22/2018 8:41 AM       Assessment:       ICD-10-CM    1. Weakness of both lower extremities R29.898 AChR Abs, All(Block, Bind, Mod), Serum     Vitamin B6     HTLV-I/-II Ab Screen, S     GANGLIOSIDE (GM1) ANTIBODIES, IGG & IGM     Glutamic Acid Decarboxylase (GAD)-65 Ab     Cytomegalovirus (CMV) antibody, IgG, EIA     Cytomegalovirus antibody, IgM   2. Muscular rigidity and spasm, progressive G25.82 Glutamic Acid Decarboxylase (GAD)-65 Ab     pregabalin (LYRICA) 50 MG capsule   3. Chronic fatigue R53.82 AChR Abs, All(Block, Bind, Mod), Serum     Vitamin B6   4. Neuropathic pain M79.2 pregabalin (LYRICA) 50 MG capsule     Cytomegalovirus (CMV) antibody, IgG, EIA     Cytomegalovirus antibody, IgM       Plan:     I had a long conversation with the patient and her mother.  She presents with progressive leg weakness, and numbness in the arms and legs of unclear etiology.  I reassured him that my suspicion for multiple sclerosis is very low.    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  I am going to check the glutamic acid decarboxylase antibodies for further evaluation.  I am also going to check her HTLV-1 and HTLV-2 titers, CMV IgG and IgM, anti-GM1 ganglioside  antibodies and vitamin B6 level for further evaluation.  I am also going to check the acetylcholine receptor antibodies to exclude the possibility of myasthenia gravis.  I explained that my suspicion for myasthenia gravis is low since she does not have eyelid ptosis, diplopia, or significant muscle fatigability.  She feels weak from the time that she wakes up in the morning.    I encouraged the patient to continue physical therapy to improve her function.  I am also going to start a therapeutic trial of Lyrica 50 mg at 8:00 PM for 4 nights, followed by 50 mg twice a day for symptomatic treatment.  She has been having significant neuropathic pain.  I reviewed potential side effects, including drowsiness, dizziness, and in rare cases, swelling at the ankles.    She will follow up in our office on 11/09/2018 or earlier if needed.  Sincerely,    Marinda Elk M.D    [facility]  Board Certified, American Board of Psychiatry and Neurology    Phone #: [phone]  Fax #: [phone]      This note was generated by the [facility] [employer] speech recognition program and may contain inherent errors or omissions not intended by the user. Grammatical errors, random word insertions, deletions, pronoun errors and incomplete sentences are occasional consequences of this technology due to software limitations. Not all errors are caught or corrected. If there are questions or concerns about the content of this note or information contained within the body of this dictation they should be addressed directly with the author for clarification.

## 2018-10-12 ENCOUNTER — Other Ambulatory Visit (FREE_STANDING_LABORATORY_FACILITY): Payer: Medicaid Other

## 2018-10-12 DIAGNOSIS — G2582 Stiff-man syndrome: Secondary | ICD-10-CM

## 2018-10-12 DIAGNOSIS — R5383 Other fatigue: Secondary | ICD-10-CM

## 2018-10-12 DIAGNOSIS — M792 Neuralgia and neuritis, unspecified: Secondary | ICD-10-CM

## 2018-10-12 DIAGNOSIS — R29898 Other symptoms and signs involving the musculoskeletal system: Secondary | ICD-10-CM

## 2018-10-13 LAB — CYTOMEGALOVIRUS ANTIBODY, IGM: CMV Ab, IgM: 3.56 — ABNORMAL HIGH (ref <=0.90)

## 2018-10-13 LAB — CYTOMEGALOVIRUS ANTIBODY, IGG: CMV AB, IgG: 3.4 — ABNORMAL HIGH (ref <=0.90)

## 2018-10-16 LAB — VITAMIN B6: Vitamin B6: 11 (ref 5–50)

## 2018-10-23 LAB — GLUTAMIC ACID DECARBOXYLASE-65 ANTIBODY: GAD65 Antibody Assay: 0.26 nmol/L — ABNORMAL HIGH (ref <=0.02)

## 2018-10-24 LAB — ACETYLCHOLINE RECEPTOR BINDING ANTIBODY: ACH Receptor Binding AB: 0 nmol/L (ref <=0.02)

## 2018-11-03 LAB — GANGLIOSIDE AB PANEL, S
IgG Asialo. GM1: NEGATIVE
IgG Disialo. GD1b: NEGATIVE
IgG Monos. GM1: NEGATIVE
IgM Asialo. GM1: NEGATIVE
IgM Disialo. GD1b: NEGATIVE
IgM Monos. GM1: NEGATIVE

## 2018-11-03 LAB — HTLV-I/-II AB SCREEN, S: HTLV-I/-II Ab Screen, S: NEGATIVE

## 2018-11-08 ENCOUNTER — Ambulatory Visit (HOSPITAL_BASED_OUTPATIENT_CLINIC_OR_DEPARTMENT_OTHER): Payer: 59 | Admitting: Neurology

## 2018-11-09 ENCOUNTER — Encounter (HOSPITAL_BASED_OUTPATIENT_CLINIC_OR_DEPARTMENT_OTHER): Payer: Self-pay | Admitting: Neurology

## 2018-11-09 ENCOUNTER — Ambulatory Visit (INDEPENDENT_AMBULATORY_CARE_PROVIDER_SITE_OTHER): Payer: 59 | Admitting: Neurology

## 2018-11-09 VITALS — BP 107/70 | HR 80 | Resp 18 | Ht 65.0 in | Wt 125.0 lb

## 2018-11-09 DIAGNOSIS — M792 Neuralgia and neuritis, unspecified: Secondary | ICD-10-CM

## 2018-11-09 DIAGNOSIS — G2582 Stiff-man syndrome: Secondary | ICD-10-CM

## 2018-11-09 DIAGNOSIS — R5382 Chronic fatigue, unspecified: Secondary | ICD-10-CM

## 2018-11-09 DIAGNOSIS — R700 Elevated erythrocyte sedimentation rate: Secondary | ICD-10-CM

## 2018-11-09 DIAGNOSIS — Z01812 Encounter for preprocedural laboratory examination: Secondary | ICD-10-CM

## 2018-11-09 DIAGNOSIS — R200 Anesthesia of skin: Secondary | ICD-10-CM

## 2018-11-09 DIAGNOSIS — R29898 Other symptoms and signs involving the musculoskeletal system: Secondary | ICD-10-CM

## 2018-11-09 DIAGNOSIS — B259 Cytomegaloviral disease, unspecified: Secondary | ICD-10-CM

## 2018-11-09 MED ORDER — LYRICA CR 165 MG PO TB24
1.00 | ORAL_TABLET | Freq: Every day | ORAL | 5 refills | Status: DC
Start: 2018-11-09 — End: 2019-03-06

## 2018-11-09 NOTE — Progress Notes (Signed)
[place] [facility] [facility] Follow up Visit    Date of visit: November 09, 2018    Subjective:      Patient ID:  Patient's Name: Alexis Gentry  Date of Birth: [birth year]  MRN: [id number]    HPI  The patient started treatment with Lyrica 50 mg twice a day, with partial improvement in neuropathic pain.  However, she has been feeling sleepy.  She continues to have significant weakness in her legs.  As you know, she underwent lumbar puncture for the possibility of Guillain-Barr syndrome, which was unrevealing.  She also had MRI of the brain and cervical spine on September 02, 2018, which were unrevealing.  She had a nerve conduction and EMG study, which was unrevealing.    The following portions of the patient's history were reviewed and updated as appropriate: allergies, current medications, past family history, past medical history, past social history, past surgical history and problem list.    Review of Systems   Constitution: Negative for fever.   HENT: Negative for hearing loss.    Respiratory: Positive for chest tightness. Negative for shortness of breath.    Endocrine: Negative for diabetes problem.   Hematologic/Lymphatic: No easy bleeding.   Skin: Negative for rash.   Musculoskeletal: Positive for back pain. Negative for neck pain.   Gastrointestinal: Negative for diarrhea.   Neurological: Positive for focal weakness, paresthesias and tremors. Negative for seizures.   Psychiatric/Behavioral: Negative for memory loss. The patient does not have insomnia.    Allergic/Immunologic: Negative for food allergies.     Past Medical History:   Diagnosis Date    Pain     bilat lower ext    Weakness      Current Outpatient Medications   Medication Sig    levonorgestrel (MIRENA) 20 MCG/24HR IUD 1 each by Intrauterine route once Dec 2018    Multiple Vitamins-Minerals (MULTIVITAMIN WITH MINERALS) tablet Take 1 tablet by mouth daily    pregabalin (LYRICA) 50 MG capsule Take 1 capsule (50 mg total) by mouth 2 (two) times daily     VITAMIN D PO Take 5,000 Units by mouth daily    GABAPENTIN PO Take by mouth    levonorgestrel-ethinyl estradiol (AVIANE,ALESSE,LESSINA) 0.1-20 MG-MCG per tablet Take 1 tablet by mouth daily.    vitamin D, cholecalciferol, 10 MCG (400 UNIT) tablet Take 10 mcg by mouth daily       Objective:   Neurological Examination:  Vitals:    11/09/18 0832   BP: 107/70   BP Site: Left arm   Patient Position: Sitting   Cuff Size: Medium   Pulse: 80   Resp: 18   SpO2: 99%   Weight: 56.7 kg (125 lb)   Height: 1.651 m (5\' 5" )     Alexis Gentry is fully awake, alert, and oriented.  She is well related, interactive, answers questions, and follows three-step commands appropriately. Speech is clear. No skin rashes are noted. Cranial nerves II through XII are grossly intact. There is no diplopia, nystagmus, eyelid ptosis, facial weakness, tongue atrophy, fasciculations or tongue weakness.   There is no focal muscle atrophy in the extremities. There are no fasciculations in the arms or legs. Muscle strength is normal in proximal and distal muscles of the arms bilaterally.  She has mild weakness in the iliopsoas muscles, tibialis anterior, and EHL muscles bilaterally.  She has mild postural tremors in both hands.  There is no cogwheel rigidity at the wrists.  There is no significant spasticity in the legs at  rest, but she becomes very spastic as she tries to take steps.  She takes short steps with a wide base.  Deep tendon reflexes are symmetrically present at 2+ in the arms, knees, and ankles.  There is no Babinski sign.  There is no ankle clonus.  There is no asterixis.  Sensation to pinprick is mildly decreased in the right lower extremity.  Coordination is intact on finger-to-nose to finger testing bilaterally.     Data Review:    A nerve conduction and EMG study from September 22, 2018 is normal.  There is no evidence of sensorimotor peripheral neuropathy, myopathy, or lumbar radiculopathy.    Appointment on 10/12/2018   Component Date  Value Ref Range Status    IgG Monos. GM1 10/12/2018 Negative  Negative Final    Comment: -------------------ADDITIONAL INFORMATION-------------------  This test was developed and its performance characteristics  determined by [facility] in a manner consistent with CLIA  requirements. This test has not been cleared or approved by  the U.S. Food and Drug Administration.      IgM Monos. GM1 10/12/2018 Negative  Negative Final    Comment: -------------------ADDITIONAL INFORMATION-------------------  This test was developed and its performance characteristics  determined by [facility] in a manner consistent with CLIA  requirements. This test has not been cleared or approved by  the U.S. Food and Drug Administration.      IgG Asialo. GM1 10/12/2018 Negative  Negative Final    Comment: -------------------ADDITIONAL INFORMATION-------------------  This test was developed and its performance characteristics  determined by [facility] in a manner consistent with CLIA  requirements. This test has not been cleared or approved by  the U.S. Food and Drug Administration.      IgM Asialo. GM1 10/12/2018 Negative  Negative Final    Comment: -------------------ADDITIONAL INFORMATION-------------------  This test was developed and its performance characteristics  determined by [facility] in a manner consistent with CLIA  requirements. This test has not been cleared or approved by  the U.S. Food and Drug Administration.      IgG Disialo. GD1b 10/12/2018 Negative  Negative Final    Comment: -------------------ADDITIONAL INFORMATION-------------------  This test was developed and its performance characteristics  determined by [facility] in a manner consistent with CLIA  requirements. This test has not been cleared or approved by  the U.S. Food and Drug Administration.      IgM Disialo. GD1b 10/12/2018 Negative  Negative Final    Comment: -------------------ADDITIONAL INFORMATION-------------------  This test was developed and  its performance characteristics  determined by [facility] in a manner consistent with CLIA  requirements. This test has not been cleared or approved by  the U.S. Food and Drug Administration.  Test Performed by:  [facility]  [address] [place], [place], [place] [address]  Lab Director: Paul Dykes M.D. Ph.D.; CLIA# [id number]      Vitamin B6 10/12/2018 11  5 - 50 Final    Comment: -------------------ADDITIONAL INFORMATION-------------------  This test was developed and its performance characteristics  determined by [facility] in a manner consistent with CLIA  requirements. This test has not been cleared or approved by  the U.S. Food and Drug Administration.  Test Performed by:  [facility]  [address] [place], [place], [place] [address]  Lab Director: Paul Dykes M.D. Ph.D.; CLIA# [id number]      HTLV-I/-II Ab Screen, S 10/12/2018 Negative  Negative Final  Comment: Test Performed by:  [facility]  [address] [place], [place], [place] [address]  Lab Director: Paul Dykes M.D. Ph.D.; CLIA# [id number]      GAD65 Antibody Assay 10/12/2018 0.26* <=0.02 nmol/L Final    Comment: The following antibody was identified: Glutamic Acid  Decarboxylase. * This profile is consistent with  predisposition to thyrogastric disorders, including  thyroiditis, pernicious anemia, and type 1 diabetes, but  has low specificity for neurological autoimmunity.  GAD65  antibody values less than 2.00 nmol/L have a lower positive  predictive value for neurological autoimmunity than values  of 20.0 nmol/L and  higher. *  -------------------ADDITIONAL INFORMATION-------------------  This test was developed and its performance characteristics  determined by [facility] in a manner consistent with CLIA  requirements. This test has not been cleared or approved by  the U.S. Food and Drug Administration.  Test Performed by:   [facility]  [address] [place], [place], [place] [address]  Lab Director: Paul Dykes M.D. Ph.D.; CLIA# [id number]      CMV AB, IgG 10/12/2018 3.40* <=0.90 Final    Comment: Index                    Interpretation  <=.90                    Negative - No CMV IgG Antibody detected  0.91 - 1.09              Equivocal  =>1.10                   Positive - CMV IgG Antibody detected  A positive result indicates that the patient has antibody to CMV.  It does not differentiate between an active or past infection.      CMV Ab, IgM 10/12/2018 3.56* <=0.90 Final    Comment: Index                    Interpretation  <=.90                    Negative - No CMV IgM Antibody detected  0.91 - 1.09              Equivocal  =>1.10                   Positive - CMV IgM Antibody detected  Results from any one IgM assay should not be used as a sole  determinant of a current  or  recent  infection, because it  can yield false positive results and low levels of IgM antibody  may persist for more than 12 months post infection.  If an  acute infection is suspected, consider obtaining a new specimen  and submit both for IgG and IgM testing in two or more weeks.  Evaluate repeatedly equivocal specimens by drawing another  sample in one to three weeks.      [facility] Receptor Binding AB 10/12/2018 0.00  <=0.02 nmol/L Final    Comment: -------------------ADDITIONAL INFORMATION-------------------  This test was developed and its performance characteristics  determined by [facility] in a manner consistent with CLIA  requirements. This test has not been cleared or approved by  the U.S. Food and Drug Administration.  Test Performed by:  [facility]  [address] [place], [place], [place] [address]  Lab Director: Chrissie Noa  Sharalyn Ink M.D. Ph.D.; CLIA# [id number]     Admission on 09/21/2018, Discharged on 09/23/2018   Component Date Value Ref Range Status    WBC 09/21/2018 4.91  3.10 - 9.50  x10 3/uL Final    Hgb 09/21/2018 13.0  11.4 - 14.8 g/dL Final    Hematocrit 44/11/4740 40.0  34.7 - 43.7 % Final    Platelets 09/21/2018 339  142 - 346 x10 3/uL Final    RBC 09/21/2018 4.42  3.90 - 5.10 x10 6/uL Final    MCV 09/21/2018 90.5  78.0 - 96.0 fL Final    MCH 09/21/2018 29.4  25.1 - 33.5 pg Final    MCHC 09/21/2018 32.5  31.5 - 35.8 g/dL Final    RDW 59/56/3875 13  11 - 15 % Final    MPV 09/21/2018 10.2  8.9 - 12.5 fL Final    Neutrophils 09/21/2018 34.9  None % Final    Lymphocytes Automated 09/21/2018 54.6  None % Final    Monocytes 09/21/2018 7.7  None % Final    Eosinophils Automated 09/21/2018 2.2  None % Final    Basophils Automated 09/21/2018 0.4  None % Final    Immature Granulocyte 09/21/2018 0.2  None % Final    Nucleated RBC 09/21/2018 0.0  0.0 - 0.0 /100 WBC Final    Neutrophils Absolute 09/21/2018 1.71  1.10 - 6.33 x10 3/uL Final    Abs Lymph Automated 09/21/2018 2.68  0.42 - 3.22 x10 3/uL Final    Abs Mono Automated 09/21/2018 0.38  0.21 - 0.85 x10 3/uL Final    Abs Eos Automated 09/21/2018 0.11  0.00 - 0.44 x10 3/uL Final    Absolute Baso Automated 09/21/2018 0.02  0.00 - 0.08 x10 3/uL Final    Absolute Immature Granulocyte 09/21/2018 0.01  0.00 - 0.07 x10 3/uL Final    Absolute NRBC 09/21/2018 0.00  0.00 - 0.00 x10 3/uL Final    Glucose 09/21/2018 71  70 - 100 mg/dL Final    Comment: ADA guidelines for diabetes mellitus:  Fasting:  Equal to or greater than 126 mg/dL  Random:   Equal to or greater than 200 mg/dL      BUN 64/33/2951 88.4  7.0 - 19.0 mg/dL Final    Creatinine 16/60/6301 0.8  0.6 - 1.0 mg/dL Final    Sodium 60/06/9322 136  136 - 145 mEq/L Final    Potassium 09/21/2018 4.0  3.5 - 5.1 mEq/L Final    Chloride 09/21/2018 105  100 - 111 mEq/L Final    CO2 09/21/2018 22  22 - 29 mEq/L Final    Calcium 09/21/2018 9.4  8.5 - 10.5 mg/dL Final    Protein, Total 09/21/2018 8.2  6.0 - 8.3 g/dL Final    Albumin 55/73/2202 4.6  3.5 - 5.0 g/dL Final    AST  (SGOT) 54/27/0623 15  5 - 34 U/L Final    ALT 09/21/2018 17  0 - 55 U/L Final    Alkaline Phosphatase 09/21/2018 95  37 - 106 U/L Final    Bilirubin, Total 09/21/2018 1.1  0.2 - 1.2 mg/dL Final    Globulin 76/28/3151 3.6  2.0 - 3.6 g/dL Final    Albumin/Globulin Ratio 09/21/2018 1.3  0.9 - 2.2 Final    Urine Type 09/21/2018 Urine, Clean Ca   Final    Color, UA 09/21/2018 Yellow  Colorless - Yellow Final    Clarity, UA 09/21/2018 Clear  Clear - Hazy Final    Specific Gravity UA  09/21/2018 1.027  1.001 - 1.035 Final    Urine pH 09/21/2018 5.0  5.0 - 8.0 Final    Leukocyte Esterase, UA 09/21/2018 Negative  Negative Final    Nitrite, UA 09/21/2018 Negative  Negative Final    Protein, UR 09/21/2018 Negative  Negative Final    Glucose, UA 09/21/2018 Negative  Negative Final    Ketones UA 09/21/2018 Negative  Negative Final    Urobilinogen, UA 09/21/2018 Normal  0.2 - 2.0 mg/dL Final    Bilirubin, UA 09/21/2018 Negative  Negative Final    Blood, UA 09/21/2018 Small* Negative Final    RBC, UA 09/21/2018 3 - 5  0 - 5 /hpf Final    Comment: Some specialty practices may define 3-5 RBC/hpf as  microscopic hematuria.      WBC, UA 09/21/2018 0 - 5  0 - 5 /hpf Final    Comment: Patient without significant pyuria(ie. >10 WBC/HPF).  Urine culture not indicated      Squamous Epithelial Cells, Urine 09/21/2018 0 - 5  0 - 25 /hpf Final    POCT QC 09/21/2018 Pass   Final    POCT Pregnancy HCG Test, UR 09/21/2018 Negative  Negative Final    Comment: 09/21/2018 Negative Value is Normal in Healthy Males or Healthy non-pregnant Females   Final    Lipase 09/21/2018 29  8 - 78 U/L Final    EGFR 09/21/2018 >60.0   Final    Comment: Disease State Reference Ranges:    Chronic Kidney Disease; < 60 ml/min/1.73 sq.m    Kidney Failure; < 15 ml/min/1.73 sq.m    [Calculated using IDMS-Traceable MDRD equation (based on    gender, age and black vs. non-black race) recommended by    [employer] Kidney Disease Education Program. No  data    available for non-white, non-black race.]  GFR estimates are unreliable in patients with:    Rapidly changing kidney function or recent dialysis    Extreme age, body size or body composition(obesity,    severe malnutrition). Abnormal muscle mass (limb    amputation, muscle wasting). In these patients,    alternative determinations of GFR should be obtained.      Phosphorus 09/22/2018 4.3  2.3 - 4.7 mg/dL Final    Magnesium 16/06/9603 1.9  1.6 - 2.6 mg/dL Final    PT 54/05/8118 14.3  12.6 - 15.0 sec Final    PT INR 09/22/2018 1.1  0.9 - 1.1 Final    Comment: Recommended Ranges for Protime INR:    2.0-3.0 for most medical and surgical thromboembolic states    2.5-3.5 for artificial heart valves  INR result may not represent exact Warfarin dosing level during  the transition period from Heparin to Warfarin therapy.  Results should be interpreted based on current anticoagulant  therapy and patient's clinical presentation.      PTT 09/22/2018 37  23 - 37 sec Final    Comment: In vivo therapeutic range of heparin (0.3 - 0.7 IU/mL)  correlate with the following APTT times: 64 - 102 seconds.  Results should be interpreted based on current anticoagulant  therapy and patients clinical presentation.      Urine Type 09/21/2018 Clean Catch   Final    Color, UA 09/21/2018 Yellow  Colorless - Yellow Final    Clarity, UA 09/21/2018 Clear  Clear - Hazy Final    Specific Gravity UA 09/21/2018 1.024  1.001 - 1.035 Final    Urine pH 09/21/2018 6.0  5.0 - 8.0 Final  Leukocyte Esterase, UA 09/21/2018 Negative  Negative Final    Nitrite, UA 09/21/2018 Negative  Negative Final    Protein, UR 09/21/2018 Negative  Negative Final    Glucose, UA 09/21/2018 Negative  Negative Final    Ketones UA 09/21/2018 Negative  Negative Final    Urobilinogen, UA 09/21/2018 Normal  0.2 - 2.0 mg/dL Final    Bilirubin, UA 09/21/2018 Negative  Negative Final    Blood, UA 09/21/2018 Negative  Negative Final    Comment: NOTE:  URINE  CHEMISTRY INDICATORS ARE NEGATIVE.  URINE         MICROSCOPIC NOT INDICATED.      Glucose 09/22/2018 83  70 - 100 mg/dL Final    Comment: ADA guidelines for diabetes mellitus:  Fasting:  Equal to or greater than 126 mg/dL  Random:   Equal to or greater than 200 mg/dL      BUN 16/06/9603 54.0  7.0 - 19.0 mg/dL Final    Creatinine 98/07/9146 0.7  0.6 - 1.0 mg/dL Final    Calcium 82/95/6213 9.1  8.5 - 10.5 mg/dL Final    Sodium 08/65/7846 135* 136 - 145 mEq/L Final    Potassium 09/22/2018 3.8  3.5 - 5.1 mEq/L Final    Chloride 09/22/2018 106  100 - 111 mEq/L Final    CO2 09/22/2018 22  22 - 29 mEq/L Final    WBC 09/22/2018 4.91  3.10 - 9.50 x10 3/uL Final    Hgb 09/22/2018 11.5  11.4 - 14.8 g/dL Final    Hematocrit 96/29/5284 35.5  34.7 - 43.7 % Final    Platelets 09/22/2018 316  142 - 346 x10 3/uL Final    RBC 09/22/2018 4.03  3.90 - 5.10 x10 6/uL Final    MCV 09/22/2018 88.1  78.0 - 96.0 fL Final    MCH 09/22/2018 28.5  25.1 - 33.5 pg Final    MCHC 09/22/2018 32.4  31.5 - 35.8 g/dL Final    RDW 13/24/4010 13  11 - 15 % Final    MPV 09/22/2018 10.1  8.9 - 12.5 fL Final    Neutrophils 09/22/2018 31.7  None % Final    Lymphocytes Automated 09/22/2018 58.0  None % Final    Monocytes 09/22/2018 7.5  None % Final    Eosinophils Automated 09/22/2018 2.0  None % Final    Basophils Automated 09/22/2018 0.6  None % Final    Immature Granulocyte 09/22/2018 0.2  None % Final    Nucleated RBC 09/22/2018 0.0  0.0 - 0.0 /100 WBC Final    Neutrophils Absolute 09/22/2018 1.55  1.10 - 6.33 x10 3/uL Final    Abs Lymph Automated 09/22/2018 2.85  0.42 - 3.22 x10 3/uL Final    Abs Mono Automated 09/22/2018 0.37  0.21 - 0.85 x10 3/uL Final    Abs Eos Automated 09/22/2018 0.10  0.00 - 0.44 x10 3/uL Final    Absolute Baso Automated 09/22/2018 0.03  0.00 - 0.08 x10 3/uL Final    Absolute Immature Granulocyte 09/22/2018 0.01  0.00 - 0.07 x10 3/uL Final    Absolute NRBC 09/22/2018 0.00  0.00 - 0.00 x10 3/uL Final     Vitamin B-12 09/22/2018 706  211 - 911 pg/mL Final    Folate 09/22/2018 10.5  See below ng/mL Final    Comment: Deficient    : <3.5 ng/mL  Intermediate : 3.5 - 5.4 ng/mL  Normal       : Greater Than 5.4 ng/mL  Syphilis Screen IgG and IgM 09/22/2018 Nonreactive   Final    Comment: If Syphilis screen is Reactive and RPR is Non-Reactive,  sample will be sent out for confirmation.      Sed Rate 09/22/2018 34* 0 - 20 mm/Hr Final    C-Reactive Protein 09/22/2018 <0.1  0.0 - 0.8 mg/dL Final    HIV Ag/Ab, 4th Generation 09/22/2018 Non-Reactive  Non-Reactive Final    Comment: A non-reactive HIV Ag/Ab result does not exclude HIV infection since the  time frame for seroconversion is variable.  If acute HIV infection is  suspected, HIV-1 RNA Qualitative TMA testing is recommended.      Hemolysis Index 09/22/2018 12  0 - 18 Final    EGFR 09/22/2018 >60.0   Final    Comment: Disease State Reference Ranges:    Chronic Kidney Disease; < 60 ml/min/1.73 sq.m    Kidney Failure; < 15 ml/min/1.73 sq.m    [Calculated using IDMS-Traceable MDRD equation (based on    gender, age and black vs. non-black race) recommended by    [employer] Kidney Disease Education Program. No data    available for non-white, non-black race.]  GFR estimates are unreliable in patients with:    Rapidly changing kidney function or recent dialysis    Extreme age, body size or body composition(obesity,    severe malnutrition). Abnormal muscle mass (limb    amputation, muscle wasting). In these patients,    alternative determinations of GFR should be obtained.      TSH 09/22/2018 0.58  0.35 - 4.94 uIU/mL Final    ANA Screen Reflex 09/22/2018 Negative   Final    Creatine Kinase (CK) 09/22/2018 78  29 - 168 U/L Final    Lyme AB,Total,Reflx to WB(IGM) 09/22/2018 0.58  0.00 - 0.90 Final    Comment: Index                    Interpretation  <=.90                    Negative  0.91 - 1.09              Equivocal  =>1.10                   Positive  A positive  result indicates that antibodies specific to  B. burgdorferi were detected. This indicates presumptive  evidence of probable exposure. As recommended by the Food and  Drug Aministration(FDA), all samples with positive or  equivocal results in a B. burgdorferi antibody screening  will be tested by [facility]. Positive or  equivocal screening test results should not be  interpreted as truly positive until verified as such  using a supplemental assay and clinical findings.         MRI of the brain, cervical, thoracic, and lumbar spine from September 02, 2018 was normal.  There was no evidence of any white matter demyelination or central canal stenosis.      MRI Lumbar Spine W WO Contrast [[id number]] (Order [id number])   Status: Final result   Study Result     HISTORY: Lower extremity weakness and numbness. Paralysis.    COMPARISON: None.    TECHNIQUE: Multiplanar imaging of the lumbar spine was performed before  and after the intravenous administration of 5.6cc Gadavist   contrast.         FINDINGS: There is a normal lumbar lordosis. Vertebral body and disc  space heights are within normal limits.  Bone marrow signal appears  normal. The conus medullaris appears normal, terminating at L1-L2. No  abnormal enhancement is seen. No significant disc bulge/herniation or  stenosis is identified.    IMPRESSION:    Unremarkable study.    Leandro Reasoner, MD   09/22/2018 8:41 AM       Assessment:       ICD-10-CM    1. Weakness of both lower extremities R29.898 Cytomegalovirus (CMV) antibody, IgG, EIA     Cytomegalovirus antibody, IgM     Sedimentation rate (ESR)     MRI Cervical Spine W WO Contrast     MRI Thoracic Spine W WO Contrast   2. Muscular rigidity and spasm, progressive G25.82    3. Chronic fatigue R53.82    4. Neuropathic pain M79.2 Sedimentation rate (ESR)     LYRICA CR 165 MG Tablet SR 24 hr     MRI Cervical Spine W WO Contrast     MRI Thoracic Spine W WO Contrast   5. 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 ESR raised R70.0 Sedimentation rate (ESR)   7. Blood tests prior to treatment or procedure Z01.812 BUN-Creat Ratio with GFR   8. Numbness in both hands R20.0 MRI Cervical Spine W WO Contrast         Plan:     I had a long conversation with the patient and her mother.  She presents with progressive leg weakness, and numbness in the arms and legs of unclear etiology.  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  I explained that the CMV virus can affect the spinal cord.  I would like to request an MRI of the cervical and thoracic spine with and without contrast in order to evaluate for any abnormal signal in the spinal cord.  I will check her renal function before administration of contrast.    I am also going to repeat the CMV IgG and IgM titers for comparison.  I explained that we do not have any specific antiviral therapy for CMV, but she may need to see an infectious disease specialist depending on the results.    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  However, the glutamic acid decarboxylase antibodies are negative.  In addition, the HTLV-1 and HTLV-2 titers, acetylcholine receptor antibodies, and anti-GM1 ganglioside antibodies are negative.  I explained that my suspicion for myasthenia gravis is low since she does not have eyelid ptosis, diplopia, or significant muscle fatigability.  She feels weak from the time that she wakes up in the morning.    I encouraged the patient to continue physical therapy to improve her function.  She noted partial improvement with Lyrica, but she could not tolerate Lyrica 50 mg twice a day due to sleepiness.  I would like to try Lyrica extended release 165 mg once a day.  I advised her to take it with dinner so that she can tolerate it better.    I reviewed potential side effects, including drowsiness, dizziness, and in rare cases, swelling at the ankles.     She will follow up in our office on 11/28/2018 or earlier if needed.  Sincerely,    Marinda Elk M.D    [facility]  Board Certified, American Board of Psychiatry and Neurology    Phone #: [phone]  Fax #: [phone]      This note was generated by the [facility]  speech recognition program and may contain inherent errors or omissions not intended by the user. Grammatical errors, random word insertions, deletions, pronoun errors and incomplete sentences are occasional consequences of this technology due to software limitations. Not all errors are caught or corrected. If there are questions or concerns about the content of this note or information contained within the body of this dictation they should be addressed directly with the author for clarification.

## 2018-11-16 ENCOUNTER — Telehealth (HOSPITAL_BASED_OUTPATIENT_CLINIC_OR_DEPARTMENT_OTHER): Payer: Self-pay

## 2018-11-16 NOTE — Telephone Encounter (Signed)
Checking on status of Provider statement forms for Disability.

## 2018-11-19 NOTE — Telephone Encounter (Signed)
Please, scan the completed form and fax it.  I will give it to you,  Thank you,  Marinda Elk MD

## 2018-11-20 ENCOUNTER — Encounter (HOSPITAL_BASED_OUTPATIENT_CLINIC_OR_DEPARTMENT_OTHER): Payer: Self-pay | Admitting: Neurology

## 2018-11-20 NOTE — Telephone Encounter (Signed)
Disability forms completed and faxed to the [employer]. [phone].  I have attempted to reach the patient at [phone].  Message left as above.

## 2018-11-22 ENCOUNTER — Telehealth (HOSPITAL_BASED_OUTPATIENT_CLINIC_OR_DEPARTMENT_OTHER): Payer: Self-pay

## 2018-11-22 NOTE — Telephone Encounter (Signed)
Pt's blood work and MRI will not be done prior to currently scheduled appointment. Would like to be seen either 3/26 or 4/2 @ 8 am. Please advise if either appointment is acceptable.

## 2018-11-22 NOTE — Telephone Encounter (Signed)
I have attempted to reach the patient at [phone]. Message left per Dr. Hamilton Capri:  I am fully booked both days. Unless there is a cancellation, she may need to postpone the appointment later in April.  Thank you,  Marinda Elk MD

## 2018-11-22 NOTE — Telephone Encounter (Signed)
I am fully booked both days. Unless there is a cancellation, she may need to postpone the appointment later in April.  Thank you,  Marinda Elk MD

## 2018-11-27 ENCOUNTER — Encounter (HOSPITAL_BASED_OUTPATIENT_CLINIC_OR_DEPARTMENT_OTHER): Payer: Self-pay | Admitting: Neurology

## 2018-11-28 ENCOUNTER — Ambulatory Visit (HOSPITAL_BASED_OUTPATIENT_CLINIC_OR_DEPARTMENT_OTHER): Payer: 59 | Admitting: Neurology

## 2018-11-30 ENCOUNTER — Ambulatory Visit: Payer: 59

## 2018-12-07 ENCOUNTER — Ambulatory Visit (HOSPITAL_BASED_OUTPATIENT_CLINIC_OR_DEPARTMENT_OTHER): Payer: 59 | Admitting: Family Nurse Practitioner

## 2018-12-11 ENCOUNTER — Encounter (HOSPITAL_BASED_OUTPATIENT_CLINIC_OR_DEPARTMENT_OTHER): Payer: Self-pay | Admitting: Neurology

## 2019-01-05 ENCOUNTER — Other Ambulatory Visit (FREE_STANDING_LABORATORY_FACILITY): Payer: 59

## 2019-01-05 ENCOUNTER — Other Ambulatory Visit (HOSPITAL_BASED_OUTPATIENT_CLINIC_OR_DEPARTMENT_OTHER): Payer: Self-pay | Admitting: Neurology

## 2019-01-05 DIAGNOSIS — R700 Elevated erythrocyte sedimentation rate: Secondary | ICD-10-CM

## 2019-01-05 DIAGNOSIS — R29898 Other symptoms and signs involving the musculoskeletal system: Secondary | ICD-10-CM

## 2019-01-05 DIAGNOSIS — M792 Neuralgia and neuritis, unspecified: Secondary | ICD-10-CM

## 2019-01-05 DIAGNOSIS — Z01812 Encounter for preprocedural laboratory examination: Secondary | ICD-10-CM

## 2019-01-06 LAB — SEDIMENTATION RATE: Sed Rate: 25 mm/Hr — ABNORMAL HIGH (ref 0–20)

## 2019-01-08 LAB — BUN-CREAT RATIO W-EGFR
BUN / Creatinine Ratio: 23 (ref 9–23)
BUN: 15 mg/dL (ref 6–20)
Creatinine: 0.66 mg/dL (ref 0.57–1.00)
EGFR: 125 mL/min/{1.73_m2} (ref >59)
EGFR: 144 mL/min/{1.73_m2} (ref >59)

## 2019-01-08 LAB — CYTOMEGALOVIRUS ANTIBODY, IGM: CMV Ab, IgM: 2.31 — ABNORMAL HIGH (ref <=0.90)

## 2019-01-08 LAB — CYTOMEGALOVIRUS ANTIBODY, IGG: CMV AB, IgG: 2.8 — ABNORMAL HIGH (ref <=0.90)

## 2019-01-09 ENCOUNTER — Telehealth (HOSPITAL_BASED_OUTPATIENT_CLINIC_OR_DEPARTMENT_OTHER): Payer: Self-pay

## 2019-01-09 NOTE — Telephone Encounter (Signed)
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  Forward the results to her PCP in case she needs antiviral treatment.  Thank you,  Marinda Elk MD

## 2019-01-09 NOTE — Telephone Encounter (Signed)
Spoke to this pt re lab results. She stated  I was suppose to follow up with Dr. Hamilton Capri.     Explained the message per [place] and last encounter note.     The pt stated I was suppose to follow up with Infectious disease.     Explained the message per Dr. Hamilton Capri. Pt stated.     I want to talk to Dr. Hamilton Capri.     Pt will send a mychart message. Call ended.

## 2019-01-09 NOTE — Telephone Encounter (Signed)
mychart message to follow

## 2019-01-17 ENCOUNTER — Ambulatory Visit (HOSPITAL_BASED_OUTPATIENT_CLINIC_OR_DEPARTMENT_OTHER): Payer: 59 | Admitting: Neurology

## 2019-01-24 ENCOUNTER — Encounter (HOSPITAL_BASED_OUTPATIENT_CLINIC_OR_DEPARTMENT_OTHER): Payer: Self-pay | Admitting: Neurology

## 2019-01-24 ENCOUNTER — Telehealth (INDEPENDENT_AMBULATORY_CARE_PROVIDER_SITE_OTHER): Payer: 59 | Admitting: Neurology

## 2019-01-24 VITALS — Ht 65.0 in | Wt 123.0 lb

## 2019-01-24 DIAGNOSIS — R5382 Chronic fatigue, unspecified: Secondary | ICD-10-CM

## 2019-01-24 DIAGNOSIS — R200 Anesthesia of skin: Secondary | ICD-10-CM

## 2019-01-24 DIAGNOSIS — R29898 Other symptoms and signs involving the musculoskeletal system: Secondary | ICD-10-CM

## 2019-01-24 DIAGNOSIS — M792 Neuralgia and neuritis, unspecified: Secondary | ICD-10-CM

## 2019-01-24 DIAGNOSIS — G2582 Stiff-man syndrome: Secondary | ICD-10-CM

## 2019-01-24 MED ORDER — DIAZEPAM 5 MG PO TABS
5.0000 mg | ORAL_TABLET | Freq: Two times a day (BID) | ORAL | 4 refills | Status: DC | PRN
Start: 2019-01-24 — End: 2019-08-22

## 2019-01-24 NOTE — Progress Notes (Signed)
NEUROLOGY TELEMEDICINE VIDEO FOLLOW UP VISIT           CLINICAL SUMMARY     Date of Visit: Jan 24, 2019     Verbal Consent     Verbal consent has been obtained from the patient to conduct a Video Visit encounter to minimize exposure to COVID-19: yes        Patient ID:  Patient's Name: Alexis Gentry  Date of Birth: 1996-05-30  MRN: [id number]    HPI  The patient was able to start treatment with Lyrica CR 165 mg once a day with less side effects.  However, she is still tends to wake up in the middle of the night due to pain.  She continues to have significant weakness and spasticity in both legs.  She does not think that there is significant worsening since February.    She has not been able to get physical therapy.  She was not able to schedule the MRI of the cervical and thoracic spine.  The following portions of the patient's history were reviewed and updated as appropriate: allergies, current medications, past family history, past medical history, past social history and problem list.    Review of Systems   Constitution: Negative for fever.   HENT: Negative for hearing loss.    Eyes: Negative for double vision.   Respiratory: Positive for chest tightness. Negative for cough and shortness of breath.    Endocrine: Negative for diabetes problem.   Skin: Negative for rash.   Musculoskeletal: Positive for back pain. Negative for neck pain.   Gastrointestinal: Negative for diarrhea.   Neurological: Positive for focal weakness, paresthesias and tremors. Negative for seizures.   Psychiatric/Behavioral: Negative for memory loss. The patient does not have insomnia.      Past Medical History:   Diagnosis Date    Pain     bilat lower ext    Weakness      Current Outpatient Medications   Medication Sig    levonorgestrel (MIRENA) 20 MCG/24HR IUD 1 each by Intrauterine route once Dec 2018    levonorgestrel-ethinyl estradiol (AVIANE,ALESSE,LESSINA) 0.1-20 MG-MCG per tablet Take 1 tablet by mouth daily.    LYRICA CR 165  MG Tablet SR 24 hr Take 1 tablet by mouth daily with dinner    Multiple Vitamins-Minerals (MULTIVITAMIN WITH MINERALS) tablet Take 1 tablet by mouth daily    vitamin D, cholecalciferol, 10 MCG (400 UNIT) tablet Take 10 mcg by mouth daily       Objective:     Neurological Examination:  As per patient:    Vitals:    01/24/19 0851   Weight: 55.8 kg (123 lb)   Height: 1.651 m (5\' 5" )   Body mass index is 20.47 kg/m.    Alexis Gentry is fully awake, alert, and oriented.  She is interactive, answers questions, and follows three-step commands appropriately. Speech is clear.   Cranial nerves II through XII are grossly intact. There is no diplopia, nystagmus, eyelid ptosis, facial weakness, tongue atrophy, fasciculations or tongue weakness. There is no fixation on arm rotating test.   The patient is able to move all 4 extremities symmetrically. No tremors are noted in the hands. There is no asterixis.  There is no dysmetria on finger-to-nose to finger testing bilaterally.  Her gait is still very spastic, wide-based.    Data Review:    Laboratory:    Orders Only on 01/05/2019   Component Date Value Ref Range Status    BUN  01/05/2019 15  6 - 20 mg/dL Final    Creatinine 16/06/9603 0.66  0.57 - 1.00 mg/dL Final    EGFR 54/05/8118 125  >59 mL/min/1.73 Final    EGFR 01/05/2019 144  >59 mL/min/1.73 Final    BUN/Creatinine Ratio 01/05/2019 23  9 - 23 Final   Appointment on 01/05/2019   Component Date Value Ref Range Status    CMV AB, IgG 01/05/2019 2.80* <=0.90 Final    Comment: Index                    Interpretation  <=.90                    Negative - No CMV IgG Antibody detected  0.91 - 1.09              Equivocal  =>1.10                   Positive - CMV IgG Antibody detected  A positive result indicates that the patient has antibody to CMV.  It does not differentiate between an active or past infection.      CMV Ab, IgM 01/05/2019 2.31* <=0.90 Final    Comment: Index                    Interpretation  <=.90                     Negative - No CMV IgM Antibody detected  0.91 - 1.09              Equivocal  =>1.10                   Positive - CMV IgM Antibody detected  Results from any one IgM assay should not be used as a sole  determinant of a current  or  recent  infection, because it  can yield false positive results and low levels of IgM antibody  may persist for more than 12 months post infection.  If an  acute infection is suspected, consider obtaining a new specimen  and submit both for IgG and IgM testing in two or more weeks.  Evaluate repeatedly equivocal specimens by drawing another  sample in one to three weeks.      Sed Rate 01/05/2019 25* 0 - 20 mm/Hr Final   Appointment on 10/12/2018   Component Date Value Ref Range Status    IgG Monos. GM1 10/12/2018 Negative  Negative Final    Comment: -------------------ADDITIONAL INFORMATION-------------------  This test was developed and its performance characteristics  determined by [facility] in a manner consistent with CLIA  requirements. This test has not been cleared or approved by  the U.S. Food and Drug Administration.      IgM Monos. GM1 10/12/2018 Negative  Negative Final    Comment: -------------------ADDITIONAL INFORMATION-------------------  This test was developed and its performance characteristics  determined by [facility] in a manner consistent with CLIA  requirements. This test has not been cleared or approved by  the U.S. Food and Drug Administration.      IgG Asialo. GM1 10/12/2018 Negative  Negative Final    Comment: -------------------ADDITIONAL INFORMATION-------------------  This test was developed and its performance characteristics  determined by [facility] in a manner consistent with CLIA  requirements. This test has not been cleared or approved by  the U.S. Food and Drug Administration.      IgM Asialo. GM1  10/12/2018 Negative  Negative Final    Comment: -------------------ADDITIONAL INFORMATION-------------------  This test was developed and its  performance characteristics  determined by [facility] in a manner consistent with CLIA  requirements. This test has not been cleared or approved by  the U.S. Food and Drug Administration.      IgG Disialo. GD1b 10/12/2018 Negative  Negative Final    Comment: -------------------ADDITIONAL INFORMATION-------------------  This test was developed and its performance characteristics  determined by [facility] in a manner consistent with CLIA  requirements. This test has not been cleared or approved by  the U.S. Food and Drug Administration.      IgM Disialo. GD1b 10/12/2018 Negative  Negative Final    Comment: -------------------ADDITIONAL INFORMATION-------------------  This test was developed and its performance characteristics  determined by [facility] in a manner consistent with CLIA  requirements. This test has not been cleared or approved by  the U.S. Food and Drug Administration.  Test Performed by:  [facility]  [address] [place], [place], [place] [address]  Lab Director: Paul Dykes M.D. Ph.D.; CLIA# [id number]      Vitamin B6 10/12/2018 11  5 - 50 Final    Comment: -------------------ADDITIONAL INFORMATION-------------------  This test was developed and its performance characteristics  determined by [facility] in a manner consistent with CLIA  requirements. This test has not been cleared or approved by  the U.S. Food and Drug Administration.  Test Performed by:  [facility]  [address] [place], [place], [place] [address]  Lab Director: Paul Dykes M.D. Ph.D.; CLIA# [id number]      HTLV-I/-II Ab Screen, S 10/12/2018 Negative  Negative Final    Comment: Test Performed by:  [facility]  [address] [place], [place], [place] [address]  Lab Director: Paul Dykes M.D. Ph.D.; CLIA# [id number]      GAD65 Antibody Assay 10/12/2018 0.26* <=0.02 nmol/L Final    Comment: The following antibody  was identified: Glutamic Acid  Decarboxylase. * This profile is consistent with  predisposition to thyrogastric disorders, including  thyroiditis, pernicious anemia, and type 1 diabetes, but  has low specificity for neurological autoimmunity.  GAD65  antibody values less than 2.00 nmol/L have a lower positive  predictive value for neurological autoimmunity than values  of 20.0 nmol/L and  higher. *  -------------------ADDITIONAL INFORMATION-------------------  This test was developed and its performance characteristics  determined by [facility] in a manner consistent with CLIA  requirements. This test has not been cleared or approved by  the U.S. Food and Drug Administration.  Test Performed by:  [facility]  [address] [place], [place], [place] [address]  Lab Director: Paul Dykes M.D. Ph.D.; CLIA# [id number]      CMV AB, IgG 10/12/2018 3.40* <=0.90 Final    Comment: Index                    Interpretation  <=.90                    Negative - No CMV IgG Antibody detected  0.91 - 1.09              Equivocal  =>1.10                   Positive - CMV IgG Antibody detected  A positive result indicates that the  patient has antibody to CMV.  It does not differentiate between an active or past infection.      CMV Ab, IgM 10/12/2018 3.56* <=0.90 Final    Comment: Index                    Interpretation  <=.90                    Negative - No CMV IgM Antibody detected  0.91 - 1.09              Equivocal  =>1.10                   Positive - CMV IgM Antibody detected  Results from any one IgM assay should not be used as a sole  determinant of a current  or  recent  infection, because it  can yield false positive results and low levels of IgM antibody  may persist for more than 12 months post infection.  If an  acute infection is suspected, consider obtaining a new specimen  and submit both for IgG and IgM testing in two or more weeks.  Evaluate repeatedly equivocal specimens by drawing  another  sample in one to three weeks.      [facility] Receptor Binding AB 10/12/2018 0.00  <=0.02 nmol/L Final    Comment: -------------------ADDITIONAL INFORMATION-------------------  This test was developed and its performance characteristics  determined by [facility] in a manner consistent with CLIA  requirements. This test has not been cleared or approved by  the U.S. Food and Drug Administration.  Test Performed by:  [facility]  [address] [place], [place], [place] [address]  Lab Director: Paul Dykes M.D. Ph.D.; CLIA# [id number]       Assessment:       ICD-10-CM    1. Weakness of both lower extremities R29.898 MRI Thoracic Spine W WO Contrast     MRI Cervical Spine W WO Contrast   2. Muscular rigidity and spasm, progressive G25.82 diazePAM (VALIUM) 5 MG tablet   3. Chronic fatigue R53.82    4. Neuropathic pain M79.2 MRI Thoracic Spine W WO Contrast     MRI Cervical Spine W WO Contrast   5. Numbness in both hands R20.0 MRI Cervical Spine W WO Contrast       Plan:     I had a long conversation with the patient via video conference.  She presents with progressive leg weakness, and numbness in the arms and legs of unclear etiology.  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  I explained that the CMV virus can affect the spinal cord.    I advised the patient to schedule the MRI of the cervical and thoracic spine with and without contrast in order to evaluate for any abnormal signal in the spinal cord.  Her renal function is normal.    I also advised her to have an evaluation by the infectious disease specialist in case there is any specific treatment for CMV.    She will start physical therapy once it is available.    I advised her to continue taking Lyrica CR 165 mg once a day.  I am also going to try diazepam 5 mg before bedtime since she has been waking up with pain in the middle of the night.  She could potentially take it twice a day if she does not  get too  drowsy with it.    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  According to the laboratory, the titer needs to be higher than 20 to suggest a neurological autoimmune condition.  She does not have exacerbation of muscle stiffness by getting surprised or upset, which is typical of stiff person syndrome.  I discussed the option of pursuing second-generation genetic testing for conditions such as limb-girdle muscular dystrophies.  Her mother has had similar symptoms.    Ms. Clauson will follow up in our office in 5 weeks or earlier if needed.  Sincerely,    Marinda Elk M.D    [facility]  Board Certified, American Board of Psychiatry and Neurology    Phone #: [phone]  Fax #: [phone]      This note was generated by the [facility] speech recognition program and may contain inherent errors or omissions not intended by the user. Grammatical errors, random word insertions, deletions, pronoun errors and incomplete sentences are occasional consequences of this technology due to software limitations. Not all errors are caught or corrected. If there are questions or concerns about the content of this note or information contained within the body of this dictation they should be addressed directly with the author for clarification.     Time spent in medical discussion:  20 minutes

## 2019-02-20 ENCOUNTER — Encounter (HOSPITAL_BASED_OUTPATIENT_CLINIC_OR_DEPARTMENT_OTHER): Payer: Self-pay | Admitting: Neurology

## 2019-02-26 ENCOUNTER — Encounter (HOSPITAL_BASED_OUTPATIENT_CLINIC_OR_DEPARTMENT_OTHER): Payer: Self-pay | Admitting: Neurology

## 2019-02-26 ENCOUNTER — Encounter (HOSPITAL_BASED_OUTPATIENT_CLINIC_OR_DEPARTMENT_OTHER): Payer: Self-pay

## 2019-02-26 NOTE — Progress Notes (Signed)
Faxed Disability Medical Request Form to [facility]. See attached scan for form.

## 2019-02-27 ENCOUNTER — Telehealth (HOSPITAL_BASED_OUTPATIENT_CLINIC_OR_DEPARTMENT_OTHER): Payer: 59 | Admitting: Neurology

## 2019-03-02 ENCOUNTER — Ambulatory Visit
Admission: RE | Admit: 2019-03-02 | Discharge: 2019-03-02 | Disposition: A | Discharge status: Home / Self Care | Payer: 59 | Source: Ambulatory Visit | Attending: Neurology | Admitting: Neurology

## 2019-03-02 DIAGNOSIS — E042 Nontoxic multinodular goiter: Secondary | ICD-10-CM | POA: Insufficient documentation

## 2019-03-02 DIAGNOSIS — R200 Anesthesia of skin: Secondary | ICD-10-CM | POA: Insufficient documentation

## 2019-03-02 DIAGNOSIS — M792 Neuralgia and neuritis, unspecified: Secondary | ICD-10-CM | POA: Insufficient documentation

## 2019-03-02 DIAGNOSIS — R29898 Other symptoms and signs involving the musculoskeletal system: Secondary | ICD-10-CM | POA: Insufficient documentation

## 2019-03-02 MED ORDER — GADOBUTROL 1 MMOL/ML IV SOLN
0.10 mmol/kg | Freq: Once | INTRAVENOUS | Status: AC | PRN
Start: 2019-03-02 — End: 2019-03-02
  Administered 2019-03-02: 13:00:00 6 mmol via INTRAVENOUS
  Filled 2019-03-02: qty 7.5

## 2019-03-06 ENCOUNTER — Telehealth (INDEPENDENT_AMBULATORY_CARE_PROVIDER_SITE_OTHER): Payer: 59 | Admitting: Neurology

## 2019-03-06 ENCOUNTER — Encounter (HOSPITAL_BASED_OUTPATIENT_CLINIC_OR_DEPARTMENT_OTHER): Payer: Self-pay | Admitting: Neurology

## 2019-03-06 VITALS — BP 124/80 | HR 72 | Ht 65.0 in | Wt 125.0 lb

## 2019-03-06 DIAGNOSIS — R29898 Other symptoms and signs involving the musculoskeletal system: Secondary | ICD-10-CM

## 2019-03-06 DIAGNOSIS — R200 Anesthesia of skin: Secondary | ICD-10-CM

## 2019-03-06 DIAGNOSIS — R5382 Chronic fatigue, unspecified: Secondary | ICD-10-CM

## 2019-03-06 DIAGNOSIS — B259 Cytomegaloviral disease, unspecified: Secondary | ICD-10-CM

## 2019-03-06 DIAGNOSIS — M792 Neuralgia and neuritis, unspecified: Secondary | ICD-10-CM

## 2019-03-06 DIAGNOSIS — G2582 Stiff-man syndrome: Secondary | ICD-10-CM

## 2019-03-06 MED ORDER — LYRICA CR 165 MG PO TB24
1.00 | ORAL_TABLET | Freq: Every day | ORAL | 5 refills | Status: DC
Start: 2019-03-06 — End: 2019-08-22

## 2019-03-06 NOTE — Progress Notes (Signed)
NEUROLOGY TELEMEDICINE VIDEO FOLLOW UP VISIT           CLINICAL SUMMARY     Date of Visit: March 06, 2019     Verbal Consent     Verbal consent has been obtained from the patient to conduct a Video Visit encounter to minimize exposure to COVID-19: yes        Patient ID:  Patient's Name: Alexis Gentry  Date of Birth: [birth year]  MRN: [id number]    HPI  The patient reports that she continues to have significant weakness and stiffness in her legs.  She has taken Lyrica CR 165 mg once a day and diazepam 5 mg, with partial symptomatic improvement.    She does not think that there is significant worsening since February.    She has not been able to get physical therapy due to the COVID-19 crisis.  The following portions of the patient's history were reviewed and updated as appropriate: allergies, current medications, past family history, past medical history, past social history and problem list.    Review of Systems   Constitution: Negative for fever.   HENT: Negative for hearing loss.    Eyes: Negative for double vision.   Respiratory: Positive for chest tightness. Negative for cough and shortness of breath.    Endocrine: Negative for diabetes problem.   Skin: Negative for rash.   Musculoskeletal: Positive for back pain. Negative for neck pain.   Gastrointestinal: Negative for diarrhea.   Neurological: Positive for focal weakness, paresthesias and tremors. Negative for seizures.   Psychiatric/Behavioral: Negative for memory loss. The patient does not have insomnia.      Past Medical History:   Diagnosis Date    Pain     bilat lower ext    Weakness      Current Outpatient Medications   Medication Sig    diazePAM (VALIUM) 5 MG tablet Take 1 tablet (5 mg total) by mouth every 12 (twelve) hours as needed (Muscle spasms and stiffness)    levonorgestrel (MIRENA) 20 MCG/24HR IUD 1 each by Intrauterine route once Dec 2018    Lyrica CR 165 MG Tablet SR 24 hr Take 1 tablet by mouth daily with dinner    Multiple  Vitamins-Minerals (MULTIVITAMIN WITH MINERALS) tablet Take 1 tablet by mouth daily    VITAMIN D PO Take 5,000 Units by mouth daily       Objective:     Neurological Examination:  As per patient:    Vitals:    03/06/19 1246   BP: 124/80   Pulse: 72   Weight: 56.7 kg (125 lb)   Height: 1.651 m (5\' 5" )   Body mass index is 20.8 kg/m.  Alexis Gentry is fully awake, alert, and oriented.  She is interactive, answers questions, and follows three-step commands appropriately. Speech is clear.   Cranial nerves II through XII are grossly intact. There is no diplopia, nystagmus, eyelid ptosis, facial weakness, tongue atrophy, fasciculations or tongue weakness. There is no fixation on arm rotating test.  She is able to move all 4 extremities symmetrically. No tremors are noted in the hands. There is no asterixis.  There is no dysmetria on finger-to-nose to finger testing bilaterally.  Station and gait are spastic, wide-based.    Data Review:    MRI Cervical Spine W WO Contrast [[id number]] (Order [id number])   Status: Final result   Study Result     CLINICAL HISTORY: Weakness    COMPARISON: None    FINDINGS: MRI  of the cervical spine was performed using standard  technique. Intravenous Gadavist 6 cc was utilized for post contrast  imaging.    Alignment is normal. Vertebral body heights are maintained. There is no  evidence of a marrow infiltrative or replacement process.    Cord signal is normal. There is no cord edema, myelomalacia, syrinx, or  evidence of an inflammatory or demyelinating process. There are no  intraspinal neoplastic masses or abnormal fluid collections.     There is no disc bulge or protrusion. The spinal canal and neural  foramina are patent throughout the cervical levels.    Several right-sided thyroid nodules are present.    IMPRESSION:     1. Normal appearance of the cervical spine.  2. Cord signal is normal.  3. Nonspecific right thyroid nodules are present.    Trilby Drummer, MD   03/02/2019 1:05 PM        MRI Thoracic Spine W WO Contrast [[id number]] (Order [id number])   Status: Final result   Study Result     HISTORY: Lower extremity weakness, neuropathic pain    TECHNIQUE: Multiplanar, multisequence MR imaging of the thoracic spine  was performed before and after the intravenous injection of 6 cc of  Gadavist.    COMPARISON: No prior studies are available.    FINDINGS: Thoracic vertebral body heights are grossly maintained. There  is no listhesis.    There is no focal disc herniation, canal stenosis, or significant neural  foraminal stenosis.    The thoracic spinal cord is normal in caliber and signal intensity.  There is no abnormal enhancement involving the thoracic spinal cord.    Multiple thyroid nodules are partially imaged.    IMPRESSION:      1. No lesions are identified involving the thoracic spinal cord.  2. Thyroid nodules, incompletely evaluated.    Nicoletta Dress, MD   03/02/2019 1:34 PM     MRI Lumbar Spine W WO Contrast [[id number]] (Order [id number])   Status: Final result   Study Result     HISTORY: Lower extremity weakness and numbness. Paralysis.    COMPARISON: None.    TECHNIQUE: Multiplanar imaging of the lumbar spine was performed before  and after the intravenous administration of 5.6cc Gadavist   contrast.         FINDINGS: There is a normal lumbar lordosis. Vertebral body and disc  space heights are within normal limits. Bone marrow signal appears  normal. The conus medullaris appears normal, terminating at L1-L2. No  abnormal enhancement is seen. No significant disc bulge/herniation or  stenosis is identified.    IMPRESSION:    Unremarkable study.    Leandro Reasoner, MD   09/22/2018 8:41 AM     Laboratory:    Orders Only on 01/05/2019   Component Date Value Ref Range Status    BUN 01/05/2019 15  6 - 20 mg/dL Final    Creatinine 62/95/2841 0.66  0.57 - 1.00 mg/dL Final    EGFR 32/44/0102 125  >59 mL/min/1.73 Final    EGFR 01/05/2019 144  >59 mL/min/1.73 Final    BUN / Creatinine Ratio  01/05/2019 23  9 - 23 Final   Appointment on 01/05/2019   Component Date Value Ref Range Status    CMV AB, IgG 01/05/2019 2.80* <=0.90 Final    Comment: Index                    Interpretation  <=.90  Negative - No CMV IgG Antibody detected  0.91 - 1.09              Equivocal  =>1.10                   Positive - CMV IgG Antibody detected  A positive result indicates that the patient has antibody to CMV.  It does not differentiate between an active or past infection.      CMV Ab, IgM 01/05/2019 2.31* <=0.90 Final    Comment: Index                    Interpretation  <=.90                    Negative - No CMV IgM Antibody detected  0.91 - 1.09              Equivocal  =>1.10                   Positive - CMV IgM Antibody detected  Results from any one IgM assay should not be used as a sole  determinant of a current  or  recent  infection, because it  can yield false positive results and low levels of IgM antibody  may persist for more than 12 months post infection.  If an  acute infection is suspected, consider obtaining a new specimen  and submit both for IgG and IgM testing in two or more weeks.  Evaluate repeatedly equivocal specimens by drawing another  sample in one to three weeks.      Sed Rate 01/05/2019 25* 0 - 20 mm/Hr Final     Assessment:       ICD-10-CM    1. Weakness of both lower extremities R29.898    2. Muscular rigidity and spasm, progressive G25.82    3. Chronic fatigue R53.82    4. Neuropathic pain M79.2 Lyrica CR 165 MG Tablet SR 24 hr   5. Numbness in both hands R20.0    6. 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  She continues to have significant weakness and spasticity in her legs of unclear etiology.  I reassured her that the MRI of the cervical spine, thoracic spine, and lumbar spine are unrevealing.  She does not need to have any surgical intervention.    She  was found to have thyroid nodules.  I advised her to discuss with you getting an ultrasound of the thyroid for further evaluation.    I advised her to continue taking Lyrica CR 165 mg once a day.  I provided a new prescription.  I encouraged the patient to start physical therapy.  I also advised her to have the evaluation by the infectious disease specialist to further evaluate the positive CMV titers.    Ms. Goodnough will follow up in our office in 3 months or earlier if needed.  Sincerely,    Marinda Elk M.D    [facility]  Board Certified, American Board of Psychiatry and Neurology    Phone #: [phone]  Fax #: [phone]      This note was generated by the [facility] speech recognition program and may contain inherent errors or omissions not intended by the user. Grammatical errors, random word insertions, deletions, pronoun errors and incomplete sentences are occasional consequences of this  technology due to software limitations. Not all errors are caught or corrected. If there are questions or concerns about the content of this note or information contained within the body of this dictation they should be addressed directly with the author for clarification.     Time spent in medical discussion:  17 minutes

## 2019-03-28 ENCOUNTER — Encounter (HOSPITAL_BASED_OUTPATIENT_CLINIC_OR_DEPARTMENT_OTHER): Payer: Self-pay | Admitting: Neurology

## 2019-04-03 ENCOUNTER — Encounter (HOSPITAL_BASED_OUTPATIENT_CLINIC_OR_DEPARTMENT_OTHER): Payer: Self-pay

## 2019-04-03 NOTE — Progress Notes (Signed)
Disabilty parking placard form completed. Message left for pt and original copy mailed to the address on file.

## 2019-05-10 ENCOUNTER — Ambulatory Visit (HOSPITAL_BASED_OUTPATIENT_CLINIC_OR_DEPARTMENT_OTHER): Payer: 59 | Admitting: Neurology

## 2019-05-16 ENCOUNTER — Encounter (HOSPITAL_BASED_OUTPATIENT_CLINIC_OR_DEPARTMENT_OTHER): Payer: Self-pay | Admitting: Neurology

## 2019-05-17 ENCOUNTER — Encounter (HOSPITAL_BASED_OUTPATIENT_CLINIC_OR_DEPARTMENT_OTHER): Payer: Self-pay | Admitting: Neurology

## 2019-05-18 ENCOUNTER — Encounter (HOSPITAL_BASED_OUTPATIENT_CLINIC_OR_DEPARTMENT_OTHER): Payer: Self-pay

## 2019-05-18 NOTE — Progress Notes (Signed)
Faxed the disability forms to w/ fax confirmation. Mailed to the address on file.

## 2019-08-22 ENCOUNTER — Encounter (HOSPITAL_BASED_OUTPATIENT_CLINIC_OR_DEPARTMENT_OTHER): Payer: Self-pay | Admitting: Neurology

## 2019-08-22 ENCOUNTER — Telehealth (INDEPENDENT_AMBULATORY_CARE_PROVIDER_SITE_OTHER): Payer: 59 | Admitting: Neurology

## 2019-08-22 VITALS — Ht 65.0 in | Wt 125.0 lb

## 2019-08-22 DIAGNOSIS — M792 Neuralgia and neuritis, unspecified: Secondary | ICD-10-CM

## 2019-08-22 DIAGNOSIS — G2582 Stiff-man syndrome: Secondary | ICD-10-CM

## 2019-08-22 DIAGNOSIS — R29898 Other symptoms and signs involving the musculoskeletal system: Secondary | ICD-10-CM

## 2019-08-22 DIAGNOSIS — R5382 Chronic fatigue, unspecified: Secondary | ICD-10-CM

## 2019-08-22 MED ORDER — LYRICA CR 330 MG PO TB24
1.00 | ORAL_TABLET | Freq: Every day | ORAL | 5 refills | Status: DC
Start: 2019-08-22 — End: 2020-07-28

## 2019-08-22 MED ORDER — DIAZEPAM 5 MG PO TABS
5.00 mg | ORAL_TABLET | Freq: Two times a day (BID) | ORAL | 4 refills | Status: AC | PRN
Start: 2019-08-22 — End: ?

## 2019-08-22 NOTE — Progress Notes (Signed)
NEUROLOGY TELEMEDICINE VIDEO FOLLOW UP VISIT           CLINICAL SUMMARY     Date of Visit: August 22, 2019     Verbal Consent     Verbal consent has been obtained from the patient to conduct a Video Visit encounter to minimize exposure to COVID-19: yes        Patient ID:  Patient's Name: Alexis Gentry  Date of Birth: Dec 22, 1995  MRN: [id number]    HPI  The patient reports that she continues to have significant weakness and stiffness in both legs.  She has noted more frequent muscle spasms.  She also has noted that her hand tremors have become worse, especially while holding objects.  She has taken Lyrica CR 165 mg once a day and diazepam 5 mg, with partial symptomatic improvement.  She reports no new side effects.    She has not been able to get physical therapy due to the COVID-19 crisis.  She has been performing the home exercises.  The following portions of the patient's history were reviewed and updated as appropriate: allergies, current medications, past family history, past medical history, past social history and problem list.    Review of Systems   Constitution: Negative for fever.   HENT: Negative for hearing loss.    Eyes: Negative for double vision.   Respiratory: Positive for chest tightness. Negative for cough and shortness of breath.    Endocrine: Negative for diabetes problem.   Skin: Negative for rash.   Musculoskeletal: Positive for back pain. Negative for neck pain.   Gastrointestinal: Negative for diarrhea.   Neurological: Positive for focal weakness, paresthesias and tremors. Negative for seizures.   Psychiatric/Behavioral: Negative for memory loss. The patient does not have insomnia.      Past Medical History:   Diagnosis Date    Pain     bilat lower ext    Weakness      Current Outpatient Medications   Medication Sig    diazePAM (VALIUM) 5 MG tablet Take 1 tablet (5 mg total) by mouth every 12 (twelve) hours as needed (Muscle spasms and stiffness)    levonorgestrel (MIRENA) 20  MCG/24HR IUD 1 each by Intrauterine route once Dec 2018    Lyrica CR 165 MG Tablet SR 24 hr Take 1 tablet by mouth daily with dinner    Multiple Vitamins-Minerals (MULTIVITAMIN WITH MINERALS) tablet Take 1 tablet by mouth daily    VITAMIN D PO Take 5,000 Units by mouth daily       Objective:     Neurological Examination:  As per patient:    Vitals:    08/22/19 1020   Weight: 56.7 kg (125 lb)   Height: 1.651 m (5\' 5" )   Body mass index is 20.8 kg/m.  Alexis Gentry is fully awake, alert, and oriented.  She is interactive, answers questions, and follows three-step commands appropriately. Speech is clear.   Cranial nerves II through XII are grossly intact. There is no diplopia, nystagmus, eyelid ptosis, facial weakness, tongue atrophy, fasciculations or tongue weakness. There is no fixation on arm rotating test.  She is able to move all 4 extremities symmetrically. No tremors are noted in the hands. There is no asterixis.  There is no dysmetria on finger-to-nose to finger testing bilaterally.  Station and gait are spastic, wide-based.      Data Review:    MRI Cervical Spine W WO Contrast [IMG285] (Order [id number])     Status: Final result  Study Result     CLINICAL HISTORY: Weakness    COMPARISON: None    FINDINGS: MRI of the cervical spine was performed using standard  technique. Intravenous Gadavist 6 cc was utilized for post contrast  imaging.    Alignment is normal. Vertebral body heights are maintained. There is no  evidence of a marrow infiltrative or replacement process.    Cord signal is normal. There is no cord edema, myelomalacia, syrinx, or  evidence of an inflammatory or demyelinating process. There are no  intraspinal neoplastic masses or abnormal fluid collections.     There is no disc bulge or protrusion. The spinal canal and neural  foramina are patent throughout the cervical levels.    Several right-sided thyroid nodules are present.    IMPRESSION:     1. Normal appearance of the cervical  spine.  2. Cord signal is normal.  3. Nonspecific right thyroid nodules are present.    Alexis Drummer, MD   03/02/2019 1:05 PM       MRI Thoracic Spine W WO Contrast [[id number]] (Order [id number])   Status: Final result   Study Result     HISTORY: Lower extremity weakness, neuropathic pain    TECHNIQUE: Multiplanar, multisequence MR imaging of the thoracic spine  was performed before and after the intravenous injection of 6 cc of  Gadavist.    COMPARISON: No prior studies are available.    FINDINGS: Thoracic vertebral body heights are grossly maintained. There  is no listhesis.    There is no focal disc herniation, canal stenosis, or significant neural  foraminal stenosis.    The thoracic spinal cord is normal in caliber and signal intensity.  There is no abnormal enhancement involving the thoracic spinal cord.    Multiple thyroid nodules are partially imaged.    IMPRESSION:      1. No lesions are identified involving the thoracic spinal cord.  2. Thyroid nodules, incompletely evaluated.    Alexis Dress, MD   03/02/2019 1:34 PM     MRI Lumbar Spine W WO Contrast [[id number]] (Order [id number])   Status: Final result   Study Result     HISTORY: Lower extremity weakness and numbness. Paralysis.    COMPARISON: None.    TECHNIQUE: Multiplanar imaging of the lumbar spine was performed before  and after the intravenous administration of 5.6cc Gadavist   contrast.         FINDINGS: There is a normal lumbar lordosis. Vertebral body and disc  space heights are within normal limits. Bone marrow signal appears  normal. The conus medullaris appears normal, terminating at L1-L2. No  abnormal enhancement is seen. No significant disc bulge/herniation or  stenosis is identified.    IMPRESSION:    Unremarkable study.    Alexis Reasoner, MD   09/22/2018 8:41 AM     Laboratory:    Orders Only on 01/05/2019   Component Date Value Ref Range Status    BUN 01/05/2019 15  6 - 20 mg/dL Final    Creatinine 78/29/5621 0.66  0.57 - 1.00 mg/dL  Final    EGFR 30/86/5784 125  >59 mL/min/1.73 Final    EGFR 01/05/2019 144  >59 mL/min/1.73 Final    BUN / Creatinine Ratio 01/05/2019 23  9 - 23 Final     Assessment:       ICD-10-CM    1. Chronic fatigue  R53.82 MRI Brain W WO Contrast   2. Weakness of both lower extremities  R29.898 MRI Brain W  WO Contrast   3. Muscular rigidity and spasm, progressive  G25.82 Pregabalin ER (Lyrica CR) 330 MG Tablet SR 24 hr     MRI Brain W WO Contrast     diazePAM (VALIUM) 5 MG tablet   4. Neuropathic pain  M79.2 Pregabalin ER (Lyrica CR) 330 MG Tablet SR 24 hr       Plan:     I had a long conversation with the patient via video conference.  She continues to have significant weakness and spasticity in her legs of unclear etiology.  I reassured her that the MRI of the cervical spine, thoracic spine, and lumbar spine are unrevealing.  She does not need to have any surgical intervention.    She has noted exacerbation of tremors in her hands and muscle spasms.  I am going to request an MRI of the brain with and without contrast to exclude any white matter demyelination.  She also has been feeling fatigued.    I advised her to increase Lyrica CR to 330 mg once a day to optimize her treatment.  She will continue taking Valium 5 mg as needed.  I provided new prescriptions for Lyrica CR and Valium.    She was found to have thyroid nodules.  I advised her to discuss with you getting an ultrasound of the thyroid for further evaluation.    I encouraged the patient to start physical therapy after the pandemic crisis improves.   I also advised her to have the evaluation by the infectious disease specialist to further evaluate the positive CMV titers.    Ms. Montreuil will follow up in our office in 2 months or earlier if needed.  Sincerely,    Marinda Elk M.D    [facility]  Board Certified, American Board of Psychiatry and Neurology    Phone #: [phone]  Fax #: [phone]      This note was generated by the [facility] speech  recognition program and may contain inherent errors or omissions not intended by the user. Grammatical errors, random word insertions, deletions, pronoun errors and incomplete sentences are occasional consequences of this technology due to software limitations. Not all errors are caught or corrected. If there are questions or concerns about the content of this note or information contained within the body of this dictation they should be addressed directly with the author for clarification.     Time spent in medical discussion:  17 minutes

## 2019-08-22 NOTE — Patient Instructions (Addendum)
MRI of the brain with and without contrast at [facility].  MRI Facilities:    These are the phone numbers to call to schedule an MRI:  [place] [facility]:  [phone]  [facility]:  [phone]  [facility]:   [phone]    Magnetic Resonance Imaging (MRI)     Magnetic resonance imaging (MRI) is an imaging test. It lets your doctor see pictures of the inside of your body. MRI uses strong magnets and radio waves to form an image.   How do I get ready for an MRI?    Complete a screening questionnaire about any electronic or metal devices or foreign metal objects in your body.   Follow any directions you are given for not eating or drinking before the test.   Ask your provider if you should stop taking any medicine before the test.   Follow your normal daily routine unless your provider tells you otherwise.   Remove your watch, jewelry, and hearing aids. You will also need to take any credit cards, pens, pocket knives, glasses, and metal objects out of your pockets.   Remove your makeup. Makeup may contain some metal.  Most MRI tests take a half hour to an hour. But the test may take longer depending on the type of MRI you are having. Give yourself extra time to check in.   What happens during an MRI?    You may be asked to wear a hospital gown.   You may be given earplugs to wear if you need them.   You may be injected with a special dye (contrast). This makes the MRI image better.   Youll lie down on a platform that slides into the magnet.    What to tell your healthcare provider  Tell your healthcare provider and the technologist if you:    Have ever had an imaging test such as MRI or CT with contrast dye   Are allergic to contrast dye, iodine, shellfish, or any medicines   Have a serious health problem. This includes diabetes or kidney disease, or a liver transplant.   Are pregnant or could be, or are breastfeeding   Have any implanted device or  metal clips or pins in your body  What happens after an MRI?    You can get back to normal activities right away. Any contrast used will pass naturally through your body within a day. You may be told to drink more water or other fluids during this time.   Your doctor will discuss the test results with you during a follow-up appointment or over the phone.   Your next appointment is: __________________    Nancie Neas last reviewed this educational content on 01/12/2019   2000-2020 [employer] [place]. [address] [place], [place] [address]. All rights reserved. This information is not intended as a substitute for professional medical care. Always follow your healthcare professional's instructions.

## 2019-08-27 ENCOUNTER — Telehealth (HOSPITAL_BASED_OUTPATIENT_CLINIC_OR_DEPARTMENT_OTHER): Payer: Self-pay

## 2019-08-27 NOTE — Telephone Encounter (Signed)
Contacted Patient and informed her that Lyrica CR was denied by her [employer] and the Provider wants her to continue to take Pregabalin. Patient verbalize understanding.

## 2019-10-12 ENCOUNTER — Ambulatory Visit
Admission: RE | Admit: 2019-10-12 | Discharge: 2019-10-12 | Disposition: A | Discharge status: Home / Self Care | Payer: 59 | Source: Ambulatory Visit | Attending: Neurology | Admitting: Neurology

## 2019-10-12 DIAGNOSIS — R29898 Other symptoms and signs involving the musculoskeletal system: Secondary | ICD-10-CM | POA: Insufficient documentation

## 2019-10-12 DIAGNOSIS — G2582 Stiff-man syndrome: Secondary | ICD-10-CM

## 2019-10-12 DIAGNOSIS — R5382 Chronic fatigue, unspecified: Secondary | ICD-10-CM

## 2019-10-12 DIAGNOSIS — G939 Disorder of brain, unspecified: Secondary | ICD-10-CM | POA: Insufficient documentation

## 2019-10-12 MED ORDER — GADOBUTROL 1 MMOL/ML IV SOLN
0.10 mmol/kg | Freq: Once | INTRAVENOUS | Status: AC | PRN
Start: 2019-10-12 — End: 2019-10-12
  Administered 2019-10-12: 14:00:00 6 mmol via INTRAVENOUS
  Filled 2019-10-12: qty 7.5

## 2019-11-26 IMAGING — DX DG LUMBAR SPINE COMPLETE 4+V
5 series · 5 of 5 positions shown · non-contrast
Comparison: None in PACs

CLINICAL DATA: Low back pain for the past 2 weeks. Bilateral lower
extremity radicular symptoms.

EXAM:
LUMBAR SPINE - COMPLETE 4+ VIEW

[l-spine ap]
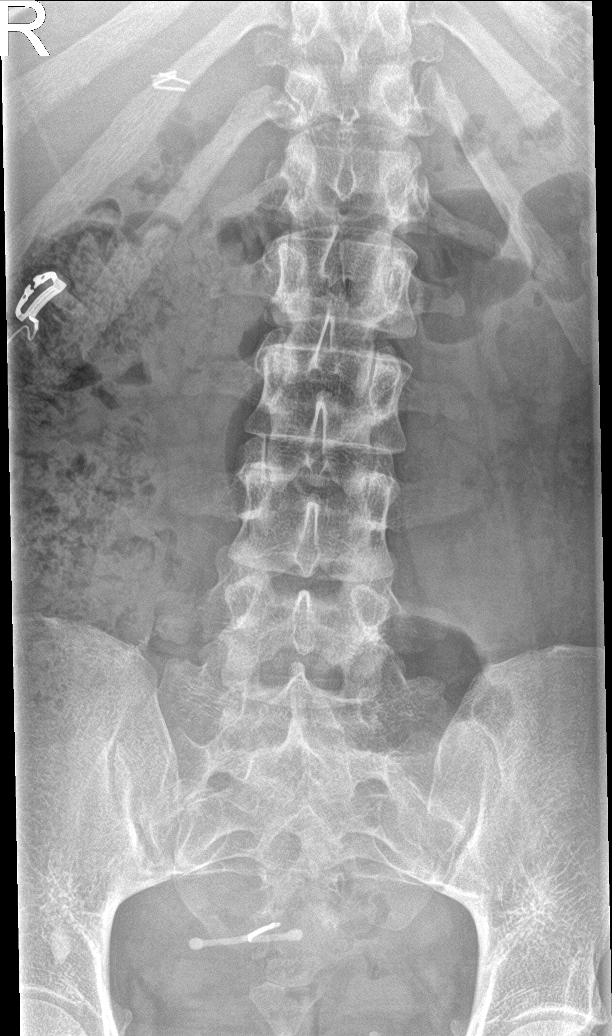

[l-spine obl (1 of 2)]
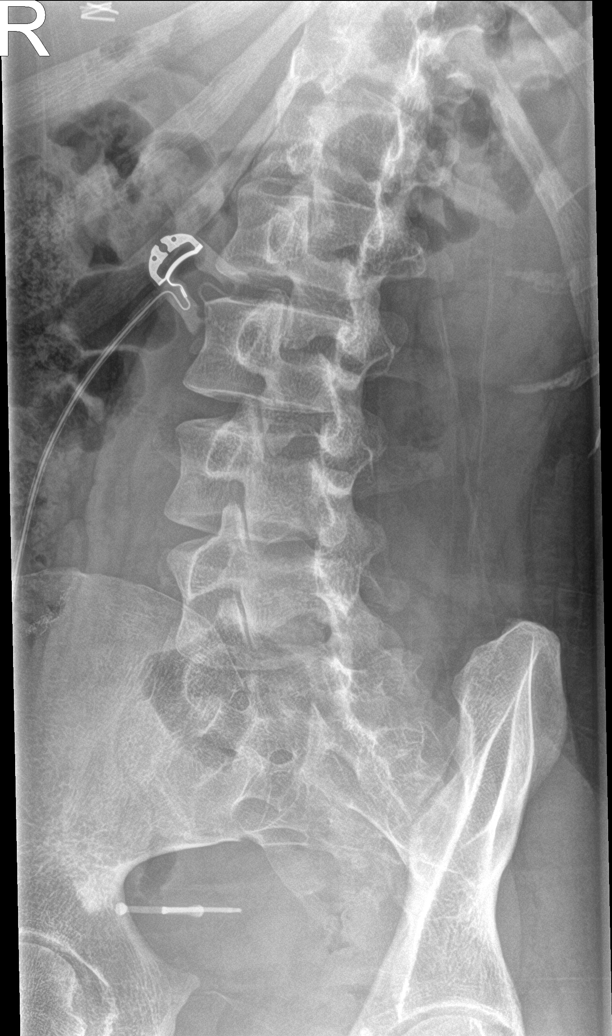

[l-spine obl (2 of 2)]
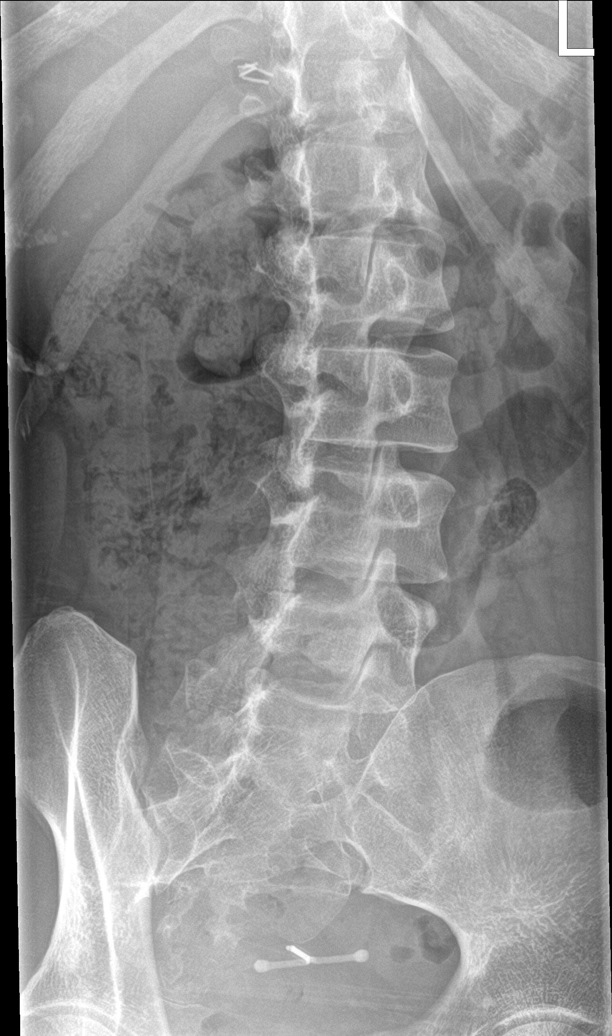

[l-spine lat]
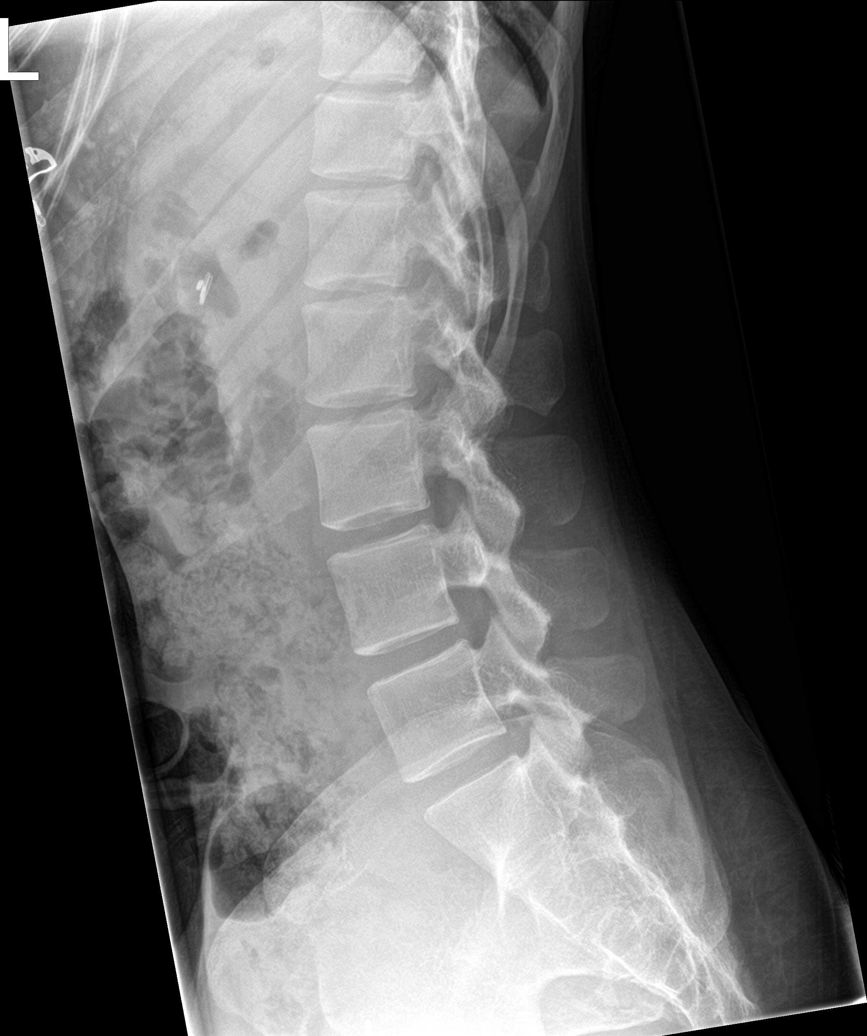

[l-spine spot]
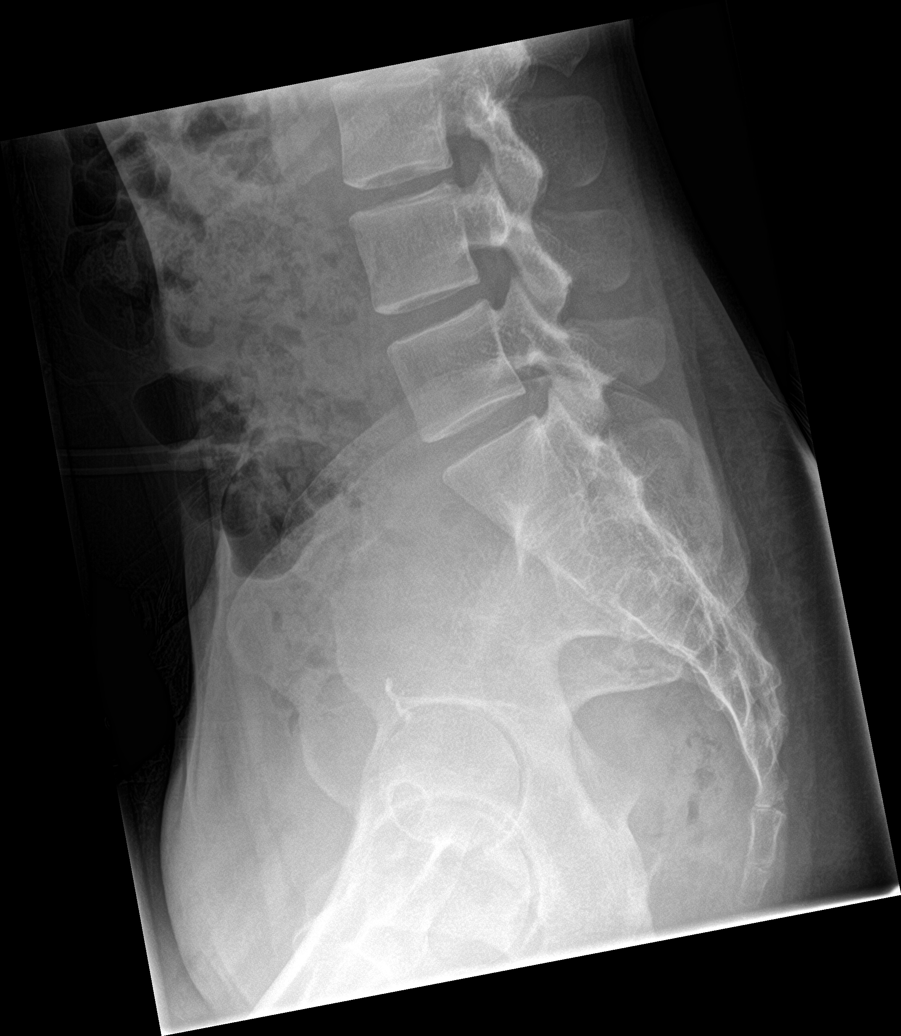

[5 of 5 positions shown; findings below may reference images not displayed]

FINDINGS: The L1 transverse processes are assumed to be transitional. The
lumbar vertebral bodies are preserved in height. The disc space
heights are well maintained. There is no spondylolisthesis. The
pedicles and transverse processes are intact. The IUD is in stable
position.
IMPRESSION: There is no acute or significant chronic bony abnormality of the
lumbar spine.

## 2020-03-11 ENCOUNTER — Telehealth (INDEPENDENT_AMBULATORY_CARE_PROVIDER_SITE_OTHER): Payer: 59 | Admitting: Neurology

## 2020-03-11 ENCOUNTER — Encounter (HOSPITAL_BASED_OUTPATIENT_CLINIC_OR_DEPARTMENT_OTHER): Payer: Self-pay | Admitting: Neurology

## 2020-03-11 VITALS — Ht 65.0 in | Wt 135.0 lb

## 2020-03-11 DIAGNOSIS — R296 Repeated falls: Secondary | ICD-10-CM

## 2020-03-11 DIAGNOSIS — R5382 Chronic fatigue, unspecified: Secondary | ICD-10-CM

## 2020-03-11 DIAGNOSIS — R29898 Other symptoms and signs involving the musculoskeletal system: Secondary | ICD-10-CM

## 2020-03-11 DIAGNOSIS — B259 Cytomegaloviral disease, unspecified: Secondary | ICD-10-CM

## 2020-03-11 DIAGNOSIS — M792 Neuralgia and neuritis, unspecified: Secondary | ICD-10-CM

## 2020-03-11 DIAGNOSIS — G2582 Stiff-man syndrome: Secondary | ICD-10-CM

## 2020-03-11 MED ORDER — PREGABALIN 75 MG PO CAPS
75.00 mg | ORAL_CAPSULE | Freq: Two times a day (BID) | ORAL | 5 refills | Status: DC
Start: 2020-03-11 — End: 2020-07-28

## 2020-03-11 MED ORDER — METAXALONE 800 MG PO TABS
800.00 mg | ORAL_TABLET | Freq: Three times a day (TID) | ORAL | 5 refills | Status: AC | PRN
Start: 2020-03-11 — End: 2020-04-10

## 2020-03-11 MED ORDER — AMITRIPTYLINE HCL 25 MG PO TABS
25.0000 mg | ORAL_TABLET | Freq: Every evening | ORAL | 5 refills | Status: DC
Start: 2020-03-11 — End: 2020-07-28

## 2020-03-11 NOTE — Progress Notes (Signed)
NEUROLOGY TELEMEDICINE VIDEO FOLLOW UP VISIT           CLINICAL SUMMARY     Date of Visit: March 11, 2020     Verbal Consent     Verbal consent has been obtained from the patient to conduct a Video Visit encounter to minimize exposure to COVID-19: yes        Patient ID:  Patient's Name: Alexis Gentry  Date of Birth: 1996/03/08  MRN: [id number]    HPI  The patient reports that she continues to have significant weakness and stiffness in both legs.  She has noted more frequent muscle spasms.  She also has noted that her hand tremors have become worse, especially while holding objects.  She also has been tripping and falling.  She has taken Lyrica 75 mg twice a day and diazepam 5 mg, with partial symptomatic improvement.  She reports no new side effects.  She has not been able to get the Lyrica extended release formulation.    She has not been able to get physical therapy due to the COVID-19 crisis.  She has been performing the home exercises.  The following portions of the patient's history were reviewed and updated as appropriate: allergies, current medications, past family history, past medical history, past social history and problem list.    Review of Systems   Constitution: Negative for fever.   HENT: Negative for hearing loss.    Eyes: Negative for double vision.   Respiratory: Negative for cough and shortness of breath.    Endocrine: Negative for diabetes problem.   Skin: Negative for rash.   Musculoskeletal: Positive for back pain. Negative for neck pain.   Gastrointestinal: Negative for diarrhea.   Neurological: Positive for focal weakness, paresthesias and tremors. Negative for seizures.   Psychiatric/Behavioral: Negative for memory loss. The patient does not have insomnia.      Past Medical History:   Diagnosis Date    Pain     bilat lower ext    Weakness      Current Outpatient Medications   Medication Sig    amitriptyline (ELAVIL) 50 MG tablet 25 mg       diazePAM (VALIUM) 5 MG tablet Take 1  tablet (5 mg total) by mouth every 12 (twelve) hours as needed (Muscle spasms and stiffness)    levonorgestrel (MIRENA) 20 MCG/24HR IUD 1 each by Intrauterine route once Dec 2018    Multiple Vitamins-Minerals (MULTIVITAMIN WITH MINERALS) tablet Take 1 tablet by mouth daily    Pregabalin ER (Lyrica CR) 330 MG Tablet SR 24 hr Take 1 tablet by mouth daily with dinner    VITAMIN D PO Take 5,000 Units by mouth daily       Objective:     Neurological Examination:  As per patient:    Vitals:    03/11/20 0838   Weight: 61.2 kg (135 lb)   Height: 1.651 m (5\' 5" )   Body mass index is 22.47 kg/m.  Alexis Gentry is fully awake, alert, and oriented.  She is interactive, answers questions, and follows three-step commands appropriately. Speech is clear.   Cranial nerves II through XII are grossly intact. There is no diplopia, nystagmus, eyelid ptosis, facial weakness, tongue atrophy, fasciculations or tongue weakness. There is no fixation on arm rotating test.  She is able to raise both arms above shoulder level symmetrically. No tremors are noted in the hands. There is no asterixis.  There is no dysmetria on finger-to-nose to finger testing bilaterally.  Data Review:    MRI Brain W WO Contrast [[id number]] (Order [id number])    Status: Final result   Study Result    Narrative & Impression   HISTORY: Weakness in both lower extremities. Progressive muscular  rigidity and spasm. Chronic fatigue. Correlate with MRI examination of  the cervical spine and thoracic spine March 02, 2019.    COMPARISON: No previous brain imaging is available. MRI examinations of  the cervical spine and thoracic spine dated March 02, 2019 are available.    TECHNIQUE: Non-contrast and contrast enhanced imaging was obtained.  The  patient received  6 mL of   Gadavist contrast material via IV  administration.    FINDINGS: The brain volume is normal. No mass, hemorrhage, or  hydrocephalus is detected.  The volume and the signal of the white  matter is  normal in both cerebral hemispheres and in both cerebellar  hemispheres.  The basilar cisterns are clear. The midline structures of  the brain appear intact. The foramen magnum is patent.  There is no  evidence of a Chiari malformation.    The diffusion weighted images are normal.  There is no evidence of acute  or recent infarction.  There are appropriate flow-voids in the major  arterial and venous structures.     After the administration of contrast material, there is normal pattern  of enhancement of the brain parenchyma and meningeal surfaces.    There is a mild generalized thickening of the calvarium.    The paranasal sinuses are aerated.  There is a small amount of mucosal  thickening in the ethmoid and maxillary sinus bilaterally.  No fluid is  seen in the paranasal sinuses.  The nasopharynx has a normal  configuration.    IMPRESSION:     1. No intracranial mass, hemorrhage, or hydrocephalus is detected.  2. The volume and the signal of the white matter is normal in both  cerebral hemispheres and in both cerebellar hemispheres.  3. There is normal pattern of enhancement of the brain parenchyma and  meningeal surfaces.  4. There is a mild generalized thickening of the calvarium.    Theodoro Doing, MD   10/12/2019 2:41 PM       MRI Cervical Spine W WO Contrast [[id number]] (Order [id number])     Status: Final result   Study Result     CLINICAL HISTORY: Weakness    COMPARISON: None    FINDINGS: MRI of the cervical spine was performed using standard  technique. Intravenous Gadavist 6 cc was utilized for post contrast  imaging.    Alignment is normal. Vertebral body heights are maintained. There is no  evidence of a marrow infiltrative or replacement process.    Cord signal is normal. There is no cord edema, myelomalacia, syrinx, or  evidence of an inflammatory or demyelinating process. There are no  intraspinal neoplastic masses or abnormal fluid collections.     There is no disc bulge or protrusion. The  spinal canal and neural  foramina are patent throughout the cervical levels.    Several right-sided thyroid nodules are present.    IMPRESSION:     1. Normal appearance of the cervical spine.  2. Cord signal is normal.  3. Nonspecific right thyroid nodules are present.    Trilby Drummer, MD   03/02/2019 1:05 PM       MRI Thoracic Spine W WO Contrast [[id number]] (Order [id number])     Status: Final result   Study Result  HISTORY: Lower extremity weakness, neuropathic pain    TECHNIQUE: Multiplanar, multisequence MR imaging of the thoracic spine  was performed before and after the intravenous injection of 6 cc of  Gadavist.    COMPARISON: No prior studies are available.    FINDINGS: Thoracic vertebral body heights are grossly maintained. There  is no listhesis.    There is no focal disc herniation, canal stenosis, or significant neural  foraminal stenosis.    The thoracic spinal cord is normal in caliber and signal intensity.  There is no abnormal enhancement involving the thoracic spinal cord.    Multiple thyroid nodules are partially imaged.    IMPRESSION:      1. No lesions are identified involving the thoracic spinal cord.  2. Thyroid nodules, incompletely evaluated.    Nicoletta Dress, MD   03/02/2019 1:34 PM     MRI Lumbar Spine W WO Contrast [[id number]] (Order [id number])   Status: Final result   Study Result     HISTORY: Lower extremity weakness and numbness. Paralysis.    COMPARISON: None.    TECHNIQUE: Multiplanar imaging of the lumbar spine was performed before  and after the intravenous administration of 5.6cc Gadavist   contrast.         FINDINGS: There is a normal lumbar lordosis. Vertebral body and disc  space heights are within normal limits. Bone marrow signal appears  normal. The conus medullaris appears normal, terminating at L1-L2. No  abnormal enhancement is seen. No significant disc bulge/herniation or  stenosis is identified.    IMPRESSION:    Unremarkable study.    Leandro Reasoner, MD    09/22/2018 8:41 AM     Laboratory:    Orders Only on 01/05/2019   Component Date Value Ref Range Status    BUN 01/05/2019 15  6 - 20 mg/dL Final    Creatinine 40/98/1191 0.66  0.57 - 1.00 mg/dL Final    EGFR 47/82/9562 125  >59 mL/min/1.73 Final    EGFR 01/05/2019 144  >59 mL/min/1.73 Final    BUN / Creatinine Ratio 01/05/2019 23  9 - 23 Final       Assessment and plan:       ICD-10-CM    1. Multiple falls  R29.6 Ambulatory referral to Physical Therapy   2. Weakness of both lower extremities  R29.898 Ambulatory referral to Physical Therapy   3. Muscular rigidity and spasm, progressive  G25.82 Ambulatory referral to Physical Therapy     pregabalin (Lyrica) 75 MG capsule     metaxalone (SKELAXIN) 800 MG tablet   4. Neuropathic pain  M79.2 Ambulatory referral to Physical Therapy     amitriptyline (ELAVIL) 25 MG tablet   5. Chronic fatigue  R53.82    6. Cytomegalovirus infection, unspecified cytomegaloviral infection type  B25.9      I had a long conversation with the patient via video conference.  She continues to have significant weakness and spasticity in her legs of unclear etiology.  I reassured her that the MRI of the brain, cervical spine, thoracic spine, and lumbar spine are unrevealing.  My suspicion for multiple sclerosis is very low.  She does not need to have any surgical intervention.    I advised her to continue Lyrica 75 mg twice a day.  I provided new prescription for Lyrica.  She is trying to get a response regarding the extended release formulation for Lyrica.  I will increase the dose to 330 mg/day once it is available.  I advised her to continue treatment with amitriptyline 25 mg at bedtime and Valium 5 mg as needed.    I have also going to try Skelaxin 800 mg to take as needed up to 3 times a day for muscle spasms.  I explained that Skelaxin tends to have less sedative effects compared to cyclobenzaprine.    I encouraged the patient to start physical therapy.  I provided a new order for  physical therapy.    Ms. Hyle will follow up in our office in 3 months or earlier if needed.  Sincerely,    Marinda Elk M.D    [facility]  Board Certified, American Board of Psychiatry and Neurology    Phone #: [phone]  Fax #: [phone]      This note was generated by the [facility] speech recognition program and may contain inherent errors or omissions not intended by the user. Grammatical errors, random word insertions, deletions, pronoun errors and incomplete sentences are occasional consequences of this technology due to software limitations. Not all errors are caught or corrected. If there are questions or concerns about the content of this note or information contained within the body of this dictation they should be addressed directly with the author for clarification.     Time spent in medical discussion:  17 minutes

## 2020-03-21 ENCOUNTER — Encounter (HOSPITAL_BASED_OUTPATIENT_CLINIC_OR_DEPARTMENT_OTHER): Payer: Self-pay | Admitting: Neurology

## 2020-05-14 ENCOUNTER — Telehealth (HOSPITAL_BASED_OUTPATIENT_CLINIC_OR_DEPARTMENT_OTHER): Payer: Self-pay | Admitting: Neurology

## 2020-05-14 NOTE — Telephone Encounter (Signed)
Patient needs to be seen for follow-up and is requesting to be squeezed in for a sooner appointment than available with Dr. Sterling Big. Please return call at noted phone number below.    Patient declined to schedule next available appointment.        Patient Preferred Callback Number:   [phone] (M)

## 2020-05-16 ENCOUNTER — Encounter (HOSPITAL_BASED_OUTPATIENT_CLINIC_OR_DEPARTMENT_OTHER): Payer: Self-pay | Admitting: Neurology

## 2020-07-01 ENCOUNTER — Ambulatory Visit (HOSPITAL_BASED_OUTPATIENT_CLINIC_OR_DEPARTMENT_OTHER): Payer: 59 | Admitting: Neurology

## 2020-07-28 ENCOUNTER — Telehealth (INDEPENDENT_AMBULATORY_CARE_PROVIDER_SITE_OTHER): Payer: 59 | Admitting: Neurology

## 2020-07-28 ENCOUNTER — Encounter (HOSPITAL_BASED_OUTPATIENT_CLINIC_OR_DEPARTMENT_OTHER): Payer: Self-pay | Admitting: Neurology

## 2020-07-28 VITALS — Ht 65.0 in | Wt 135.0 lb

## 2020-07-28 DIAGNOSIS — M792 Neuralgia and neuritis, unspecified: Secondary | ICD-10-CM

## 2020-07-28 DIAGNOSIS — R270 Ataxia, unspecified: Secondary | ICD-10-CM

## 2020-07-28 DIAGNOSIS — G2582 Stiff-man syndrome: Secondary | ICD-10-CM

## 2020-07-28 DIAGNOSIS — R296 Repeated falls: Secondary | ICD-10-CM

## 2020-07-28 DIAGNOSIS — R29898 Other symptoms and signs involving the musculoskeletal system: Secondary | ICD-10-CM

## 2020-07-28 MED ORDER — PREGABALIN 100 MG PO CAPS
100.00 mg | ORAL_CAPSULE | Freq: Two times a day (BID) | ORAL | 5 refills | Status: DC
Start: 2020-07-28 — End: 2021-01-01

## 2020-07-28 MED ORDER — AMITRIPTYLINE HCL 25 MG PO TABS
25.0000 mg | ORAL_TABLET | Freq: Every evening | ORAL | 5 refills | Status: DC
Start: 2020-07-28 — End: 2021-08-07

## 2020-07-28 NOTE — Progress Notes (Signed)
[facility] Neurology Telemedicine Follow up Visit    Date of visit: July 28, 2020    Verbal Consent     Verbal consent has been obtained from the patient to conduct a Video Visit encounter to minimize exposure to COVID-19: Yes       Patient ID:  Patient's Name: Alexis Gentry  Date of Birth: 03/19/1996  MRN: [id number]    Subjective:      HPI    The patient reports that she continues to have significant weakness and stiffness in both legs.    She has had 3 falls due to loss of balance.  The last fall occurred 3 weeks ago.  She did not have head injury.  She also has noted that her hand tremors have become worse, especially while holding objects.     She has been taking Lyrica 75 mg twice a day, amitriptyline 25 mg at bedtime, and diazepam 5 mg, with partial symptomatic improvement.  She feels that Lyrica is not working as well recently.  She has noticed some recurrence of paresthesias.   She reports no new side effects.  She has not been able to get the Lyrica extended release formulation.  The following portions of the patient's history were reviewed and updated as appropriate: allergies, current medications, past family history, past medical history, past social history, past surgical history and problem list.    Review of Systems   Constitution: Negative for fever.   HENT: Negative for hearing loss.    Eyes: Negative for double vision.   Respiratory: Negative for cough and shortness of breath.    Skin: Negative for rash.   Musculoskeletal: Positive for back pain. Negative for neck pain.   Gastrointestinal: Negative for diarrhea.   Neurological: Positive for focal weakness, paresthesias and tremors. Negative for seizures.   Psychiatric/Behavioral: Negative for memory loss. The patient does not have insomnia.      Past Medical History:   Diagnosis Date    Pain     bilat lower ext    Weakness      Current Outpatient Medications   Medication Sig    amitriptyline (ELAVIL) 25 MG tablet Take 1 tablet (25 mg  total) by mouth nightly    diazePAM (VALIUM) 5 MG tablet Take 1 tablet (5 mg total) by mouth every 12 (twelve) hours as needed (Muscle spasms and stiffness)    levonorgestrel (MIRENA) 20 MCG/24HR IUD 1 each by Intrauterine route once Dec 2018    Multiple Vitamins-Minerals (MULTIVITAMIN WITH MINERALS) tablet Take 1 tablet by mouth daily    pregabalin (Lyrica) 75 MG capsule Take 1 capsule (75 mg total) by mouth 2 (two) times daily    VITAMIN D PO Take 5,000 Units by mouth daily    Pregabalin ER (Lyrica CR) 330 MG Tablet SR 24 hr Take 1 tablet by mouth daily with dinner       Objective:     Neurological Examination:  Vitals:    07/28/20 1609   Weight: 61.2 kg (135 lb)   Height: 1.651 m (5\' 5" )   Body mass index is 22.47 kg/m.  Ms. Malicoat is fully awake, alert, and oriented. She answers questions, and follows commands appropriately. Speech is clear. No skin rashes are noted. Cranial nerves II through XII are grossly intact. There is no diplopia, nystagmus, eyelid ptosis, facial weakness, tongue atrophy, fasciculations or tongue weakness.   There is no fixation on arm rotating test.  She is able to raise both arms above shoulder  level symmetrically.      Data Review:    Results for orders placed or performed during the hospital encounter of 10/12/19   MRI Brain W WO Contrast    Narrative    HISTORY: Weakness in both lower extremities. Progressive muscular  rigidity and spasm. Chronic fatigue. Correlate with MRI examination of  the cervical spine and thoracic spine March 02, 2019.    COMPARISON: No previous brain imaging is available. MRI examinations of  the cervical spine and thoracic spine dated March 02, 2019 are available.    TECHNIQUE: Non-contrast and contrast enhanced imaging was obtained.  The  patient received  6 mL of   Gadavist contrast material via IV  administration.    FINDINGS: The brain volume is normal. No mass, hemorrhage, or  hydrocephalus is detected.  The volume and the signal of the  white  matter is normal in both cerebral hemispheres and in both cerebellar  hemispheres.  The basilar cisterns are clear. The midline structures of  the brain appear intact. The foramen magnum is patent.  There is no  evidence of a Chiari malformation.    The diffusion weighted images are normal.  There is no evidence of acute  or recent infarction.  There are appropriate flow-voids in the major  arterial and venous structures.     After the administration of contrast material, there is normal pattern  of enhancement of the brain parenchyma and meningeal surfaces.    There is a mild generalized thickening of the calvarium.    The paranasal sinuses are aerated.  There is a small amount of mucosal  thickening in the ethmoid and maxillary sinus bilaterally.  No fluid is  seen in the paranasal sinuses.  The nasopharynx has a normal  configuration.      Impression    1. No intracranial mass, hemorrhage, or hydrocephalus is detected.  2. The volume and the signal of the white matter is normal in both  cerebral hemispheres and in both cerebellar hemispheres.  3. There is normal pattern of enhancement of the brain parenchyma and  meningeal surfaces.  4. There is a mild generalized thickening of the calvarium.    Theodoro Doing, MD   10/12/2019 2:41 PM       Laboratory:    Lab Results   Component Value Date    GLU 83 09/22/2018     Lab Results   Component Value Date    NA 135 (L) 09/22/2018    K 3.8 09/22/2018    CO2 22 09/22/2018    AST 15 09/21/2018    ALT 17 09/21/2018    B12 706 09/22/2018    VITB6 11 10/12/2018    TSH 0.58 09/22/2018    WBC 4.91 09/22/2018    HGB 11.5 09/22/2018    HCT 35.5 09/22/2018    PLT 316 09/22/2018       Assessment and Plan:       ICD-10-CM    1. Multiple falls  R29.6 Ambulatory referral to Physical Therapy   2. Ataxia  R27.0 Ambulatory referral to Physical Therapy   3. Neuropathic pain  M79.2 amitriptyline (ELAVIL) 25 MG tablet   4. Muscular rigidity and spasm, progressive  G25.82 pregabalin  (Lyrica) 100 MG capsule   5. Weakness of both lower extremities  R29.898 Ambulatory referral to Physical Therapy     I had a long conversation with the patient via video conference.  She continues to have loss of balance, with several falls,  stiffness, weakness in his legs, of unclear etiology.  She is going to pursue genetic testing at the [facility] clinic in March.    In the meantime, I am going to refer the patient to physical therapy so that she can have balance training, gait training, for precautions, and a home exercise program.    I advised her to increase pregabalin to 100 mg twice a day to optimize her treatment.  She feels that the medication is not working as well.  She will continue taking amitriptyline 25 mg at bedtime.  I provided new prescriptions for pregabalin and amitriptyline.    Ms. Balthazar will follow up in our office in 4 months or earlier if needed.  She can contact the office should he have any further questions or concerns.    Sincerely,    Marinda Elk M.D    Board Certified, American Board of Psychiatry and Neurology  [facility]    Phone #: [phone]  Fax #: [phone] [place])  [address]  [place], [place]. [address]      This note was generated by the [facility] speech recognition program and may contain inherent errors or omissions not intended by the user. Grammatical errors, random word insertions, deletions, pronoun errors and incomplete sentences are occasional consequences of this technology due to software limitations. Not all errors are caught or corrected. If there are questions or concerns about the content of this note or information contained within the body of this dictation they should be addressed directly with the author for clarification.

## 2020-12-02 ENCOUNTER — Encounter (HOSPITAL_BASED_OUTPATIENT_CLINIC_OR_DEPARTMENT_OTHER): Payer: Self-pay | Admitting: Neurology

## 2020-12-09 ENCOUNTER — Encounter (HOSPITAL_BASED_OUTPATIENT_CLINIC_OR_DEPARTMENT_OTHER): Payer: Self-pay

## 2021-01-01 ENCOUNTER — Telehealth (INDEPENDENT_AMBULATORY_CARE_PROVIDER_SITE_OTHER): Payer: No Typology Code available for payment source | Admitting: Neurology

## 2021-01-01 ENCOUNTER — Encounter (HOSPITAL_BASED_OUTPATIENT_CLINIC_OR_DEPARTMENT_OTHER): Payer: Self-pay | Admitting: Neurology

## 2021-01-01 VITALS — Ht 65.0 in | Wt 130.0 lb

## 2021-01-01 DIAGNOSIS — R270 Ataxia, unspecified: Secondary | ICD-10-CM

## 2021-01-01 DIAGNOSIS — G47411 Narcolepsy with cataplexy: Secondary | ICD-10-CM

## 2021-01-01 DIAGNOSIS — M792 Neuralgia and neuritis, unspecified: Secondary | ICD-10-CM

## 2021-01-01 DIAGNOSIS — R296 Repeated falls: Secondary | ICD-10-CM

## 2021-01-01 DIAGNOSIS — G4719 Other hypersomnia: Secondary | ICD-10-CM

## 2021-01-01 DIAGNOSIS — R5382 Chronic fatigue, unspecified: Secondary | ICD-10-CM

## 2021-01-01 DIAGNOSIS — G4720 Circadian rhythm sleep disorder, unspecified type: Secondary | ICD-10-CM

## 2021-01-01 DIAGNOSIS — F518 Other sleep disorders not due to a substance or known physiological condition: Secondary | ICD-10-CM

## 2021-01-01 DIAGNOSIS — G2582 Stiff-man syndrome: Secondary | ICD-10-CM

## 2021-01-01 DIAGNOSIS — R29898 Other symptoms and signs involving the musculoskeletal system: Secondary | ICD-10-CM

## 2021-01-01 MED ORDER — PREGABALIN 100 MG PO CAPS
100.00 mg | ORAL_CAPSULE | Freq: Two times a day (BID) | ORAL | 5 refills | Status: DC
Start: 2021-01-01 — End: 2021-06-30

## 2021-01-01 MED ORDER — PREGABALIN 25 MG PO CAPS
25.00 mg | ORAL_CAPSULE | Freq: Two times a day (BID) | ORAL | 5 refills | Status: AC
Start: 2021-01-01 — End: ?

## 2021-01-01 NOTE — Patient Instructions (Signed)
You can Schedule an overnight Sleep Study:    -Sleep Lab at [facility]:  [phone]    -Sleep Lab at [facility] [address] or at [place] at:  [phone].    Do you often have problems sleeping? Do you feel tired most days of the week?   The sleep study can help diagnose a sleep disorder such as sleep apnea or narcolepsy. During the study, a special machine is used to monitor your sleep through the night.    Who needs a sleep study?  If you have sleep problems that last longer than a few weeks, you may need a sleep study. It is important to get tested for sleep disorders. Untreated sleep disorders raise your risk for heart failure, high blood pressure, stroke, diabetes, and depression. Talk with your healthcare provider. Be prepared to answer questions about your health history. Try to keep a daily sleep diary for a week or 2. Write down the time you go to bed, the time you wake up, and anything that seems to affect your sleep. Then your healthcare provider can refer you to a sleep specialist and recommend a sleep study.    Monitoring your sleep  Your sleep can be monitored at a sleep clinic or at your home. In either case, your healthcare provider will discuss the results with you at a future visit:  At a sleep clinic. Most sleep studies are done at a sleep clinic or a sleep lab. In many cases, you will need to stay overnight. You will sleep in a private room, much like a hotel or hospital room. A family member or a friend can come along. But he or she can't stay overnight. Most people don't have trouble sleeping during the study. In the morning you can go home. Sometimes you may be asked to stay at the lab the next day for a daytime nap study.  At home. At times, a sleep study can be done at home. A home sleep study provides most of the same information as a study done at a clinic. A special computer is loaned to you by a sleep clinic or a medical supplier. You will be given  instructions on how to use it. Or someone may come to your home to help. Before bedtime, the computer is turned on to monitor your sleep all night. In the morning, you return the computer.

## 2021-01-01 NOTE — Progress Notes (Signed)
[facility] Neurology Telemedicine Follow up Visit    Date of visit: January 01, 2021    Verbal Consent     Verbal consent has been obtained from the patient to conduct a Video Visit encounter to minimize exposure to COVID-19: Yes       Patient ID:  Patient's Name: Alexis Gentry  Date of Birth: 1996/05/29  MRN: [id number]    Subjective:      HPI    The patient reports that she continues to have significant weakness and stiffness in both legs.    She continues to fall down once or twice per month.  She thinks that the falls are preceded by intense emotions are getting surprised.  She has been having very disrupted sleep.  She feels fatigued during the daytime.  She has not been able to pursue the genetic testing at the [facility].  She has been getting physical therapy, which has provided partial improvement.    She also has noted that her hand tremors have become worse, especially while holding objects.     She has been taking Lyrica 100 mg twice a day, amitriptyline 25 mg at bedtime, and diazepam 5 mg, with partial symptomatic improvement.   Lyrica extended release was not approved by her insurance company.  She has noticed some recurrence of paresthesias.   She reports no new side effects.    The following portions of the patient's history were reviewed and updated as appropriate: allergies, current medications, past family history, past medical history, past social history, past surgical history and problem list.    Review of Systems   Constitution: Negative for fever.   HENT: Negative for hearing loss.    Eyes: Negative for double vision.   Respiratory: Negative for cough and shortness of breath.    Skin: Negative for rash.   Musculoskeletal: Positive for back pain. Negative for neck pain.   Gastrointestinal: Negative for diarrhea.   Neurological: Positive for focal weakness, paresthesias and tremors. Negative for seizures.   Psychiatric/Behavioral: Negative for memory loss. The patient does not have  insomnia.      Past Medical History:   Diagnosis Date    Pain     bilat lower ext    Weakness      Current Outpatient Medications   Medication Sig    amitriptyline (ELAVIL) 25 MG tablet Take 1 tablet (25 mg total) by mouth nightly    diazePAM (VALIUM) 5 MG tablet Take 1 tablet (5 mg total) by mouth every 12 (twelve) hours as needed (Muscle spasms and stiffness)    levonorgestrel (MIRENA) 20 MCG/24HR IUD 1 each by Intrauterine route once Dec 2018    Multiple Vitamins-Minerals (MULTIVITAMIN WITH MINERALS) tablet Take 1 tablet by mouth daily    pregabalin (Lyrica) 100 MG capsule Take 1 capsule (100 mg total) by mouth 2 (two) times daily    VITAMIN D PO Take 5,000 Units by mouth daily       Objective:     Neurological Examination:  Vitals:    01/01/21 1501   Weight: 59 kg (130 lb)   Height: 1.651 m (5\' 5" )   Body mass index is 21.63 kg/m.  Ms. Culbreath is fully awake, alert, and oriented. She answers questions, and follows commands appropriately. Speech is clear. No skin rashes are noted. There is no diplopia, nystagmus, eyelid ptosis, facial weakness, tongue atrophy, fasciculations or tongue weakness.   There is no fixation on arm rotating test.  She is able to raise  both arms above shoulder level symmetrically.  She has intentional tremors in both hands. She is able to open and close her fists symmetrically.      Data Review:    Results for orders placed or performed during the hospital encounter of 10/12/19   MRI Brain W WO Contrast    Narrative    HISTORY: Weakness in both lower extremities. Progressive muscular  rigidity and spasm. Chronic fatigue. Correlate with MRI examination of  the cervical spine and thoracic spine March 02, 2019.    COMPARISON: No previous brain imaging is available. MRI examinations of  the cervical spine and thoracic spine dated March 02, 2019 are available.    TECHNIQUE: Non-contrast and contrast enhanced imaging was obtained.  The  patient received  6 mL of   Gadavist contrast  material via IV  administration.    FINDINGS: The brain volume is normal. No mass, hemorrhage, or  hydrocephalus is detected.  The volume and the signal of the white  matter is normal in both cerebral hemispheres and in both cerebellar  hemispheres.  The basilar cisterns are clear. The midline structures of  the brain appear intact. The foramen magnum is patent.  There is no  evidence of a Chiari malformation.    The diffusion weighted images are normal.  There is no evidence of acute  or recent infarction.  There are appropriate flow-voids in the major  arterial and venous structures.     After the administration of contrast material, there is normal pattern  of enhancement of the brain parenchyma and meningeal surfaces.    There is a mild generalized thickening of the calvarium.    The paranasal sinuses are aerated.  There is a small amount of mucosal  thickening in the ethmoid and maxillary sinus bilaterally.  No fluid is  seen in the paranasal sinuses.  The nasopharynx has a normal  configuration.      Impression    1. No intracranial mass, hemorrhage, or hydrocephalus is detected.  2. The volume and the signal of the white matter is normal in both  cerebral hemispheres and in both cerebellar hemispheres.  3. There is normal pattern of enhancement of the brain parenchyma and  meningeal surfaces.  4. There is a mild generalized thickening of the calvarium.    Theodoro Doing, MD   10/12/2019 2:41 PM       Laboratory:    Lab Results   Component Value Date    GLU 83 09/22/2018     Lab Results   Component Value Date    NA 135 (L) 09/22/2018    K 3.8 09/22/2018    CO2 22 09/22/2018    AST 15 09/21/2018    ALT 17 09/21/2018    B12 706 09/22/2018    VITB6 11 10/12/2018    TSH 0.58 09/22/2018    WBC 4.91 09/22/2018    HGB 11.5 09/22/2018    HCT 35.5 09/22/2018    PLT 316 09/22/2018       Assessment and Plan:       ICD-10-CM    1. Ataxia  R27.0    2. Muscular rigidity and spasm, progressive  G25.82 pregabalin (Lyrica) 25  MG capsule     pregabalin (Lyrica) 100 MG capsule   3. Weakness of both lower extremities  R29.898    4. Multiple falls  R29.6 Multiple sleep latency test without CPAP     Polysomnography 4 or more parameters   5. Chronic fatigue  R53.82 Multiple sleep latency test without CPAP     Polysomnography 4 or more parameters   6. Neuropathic pain  M79.2 pregabalin (Lyrica) 25 MG capsule     pregabalin (Lyrica) 100 MG capsule   7. 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 Daytime hypersomnolence  G47.19 Multiple sleep latency test without CPAP     Polysomnography 4 or more parameters     I had a long conversation with the patient via video conference.  She continues to have muscle rigidity and multiple falls, which potentially could be triggered by intense emotions or getting surprised.  On further questioning, she reports that she feels very fatigued during the daytime and her sleep is disrupted at night.  She had at least one episode of sleep paralysis.    I am going to request an in lab overnight polysomnogram followed by the Multiple Sleep Latency Test to evaluate for REM onset sleep, which is diagnostic of narcolepsy.  Depend on the results, we can try modafinil or Sunosi.    In the meantime, I am going to increase Lyrica to 125 mg twice a day since she continues to have leg pain.  She also has been having hand tremors, which may improve with a higher dose of Lyrica.  I provided new prescription for Lyrica.  I advised her to continue treatment with amitriptyline 25 mg at bedtime.  I reassured her that the tremors are most likely related to essential tremors rather than parkinsonism.    She is going to pursue genetic testing at the [facility] clinic in March.    I encouraged her to continue physical therapy and the home exercise  program.    Ms. Dalpe will follow up in our office in 4 months or earlier if needed.  She can contact the office should he have any further questions or concerns.    Sincerely,    Marinda Elk M.D    Board Certified, American Board of Psychiatry and Neurology  [facility]    Phone #: [phone]  Fax #: [phone] Morene Antu Jasper)  [address]  [place], [place]. [address]      This note was generated by the [id number] speech recognition program and may contain inherent errors or omissions not intended by the user. Grammatical errors, random word insertions, deletions, pronoun errors and incomplete sentences are occasional consequences of this technology due to software limitations. Not all errors are caught or corrected. If there are questions or concerns about the content of this note or information contained within the body of this dictation they should be addressed directly with the author for clarification.

## 2021-01-26 ENCOUNTER — Encounter (HOSPITAL_BASED_OUTPATIENT_CLINIC_OR_DEPARTMENT_OTHER): Payer: Self-pay | Admitting: Neurology

## 2021-02-09 ENCOUNTER — Encounter (HOSPITAL_BASED_OUTPATIENT_CLINIC_OR_DEPARTMENT_OTHER): Payer: Self-pay | Admitting: Neurology

## 2021-08-07 ENCOUNTER — Encounter (HOSPITAL_BASED_OUTPATIENT_CLINIC_OR_DEPARTMENT_OTHER): Payer: Self-pay | Admitting: Neurology

## 2021-08-07 ENCOUNTER — Telehealth (INDEPENDENT_AMBULATORY_CARE_PROVIDER_SITE_OTHER): Payer: 59 | Admitting: Neurology

## 2021-08-07 VITALS — Ht 65.0 in | Wt 130.0 lb

## 2021-08-07 DIAGNOSIS — R270 Ataxia, unspecified: Secondary | ICD-10-CM

## 2021-08-07 DIAGNOSIS — M792 Neuralgia and neuritis, unspecified: Secondary | ICD-10-CM

## 2021-08-07 DIAGNOSIS — G4719 Other hypersomnia: Secondary | ICD-10-CM

## 2021-08-07 DIAGNOSIS — G47411 Narcolepsy with cataplexy: Secondary | ICD-10-CM

## 2021-08-07 DIAGNOSIS — R296 Repeated falls: Secondary | ICD-10-CM

## 2021-08-07 MED ORDER — AMITRIPTYLINE HCL 25 MG PO TABS
25.0000 mg | ORAL_TABLET | Freq: Every evening | ORAL | 5 refills | Status: DC
Start: 2021-08-07 — End: 2022-02-19

## 2021-08-07 MED ORDER — PREGABALIN 150 MG PO CAPS
150.00 mg | ORAL_CAPSULE | Freq: Two times a day (BID) | ORAL | 5 refills | Status: DC
Start: 2021-08-07 — End: 2023-02-17

## 2021-08-07 NOTE — Progress Notes (Unsigned)
[facility] Neurology Telemedicine Follow up Visit    Date of visit: August 07, 2021    Verbal Consent      Verbal consent has been obtained from the patient to conduct a Video Visit encounter to minimize exposure to COVID-19: Yes       Patient ID:  Patient's Name: Alexis Gentry  Date of Birth: 11/10/1995  MRN: [id number]    Subjective:      HPI    The patient reports that she continues to have significant weakness and stiffness in both legs.    She continues to fall down once or twice per month.  She thinks that the falls are preceded by intense emotions are getting surprised.  She has been having very disrupted sleep.  She feels fatigued during the daytime.  She has not been able to pursue the genetic testing at the [facility].  She has been getting physical therapy, which has provided partial improvement.    She also has noted that her hand tremors have become worse, especially while holding objects.     She has been taking Lyrica 100 mg twice a day, amitriptyline 25 mg at bedtime, and diazepam 5 mg, with partial symptomatic improvement.   Lyrica extended release was not approved by her insurance company.  She has noticed some recurrence of paresthesias.   She reports no new side effects.    The following portions of the patient's history were reviewed and updated as appropriate: allergies, current medications, past family history, past medical history, past social history, past surgical history and problem list.    Review of Systems   Constitution: Negative for fever.   HENT:  Negative for hearing loss.    Eyes:  Negative for double vision.   Respiratory:  Negative for cough and shortness of breath.    Skin:  Negative for rash.   Musculoskeletal:  Positive for back pain. Negative for neck pain.   Gastrointestinal:  Negative for diarrhea.   Neurological:  Positive for focal weakness, paresthesias and tremors. Negative for seizures.   Psychiatric/Behavioral:  Negative for memory loss. The patient does not  have insomnia.    Past Medical History:   Diagnosis Date    Pain     bilat lower ext    Weakness      Current Outpatient Medications on File Prior to Visit   Medication Sig Dispense Refill    amitriptyline (ELAVIL) 25 MG tablet Take 1 tablet (25 mg total) by mouth nightly 30 tablet 5    diazePAM (VALIUM) 5 MG tablet Take 1 tablet (5 mg total) by mouth every 12 (twelve) hours as needed (Muscle spasms and stiffness) 60 tablet 4    levonorgestrel (MIRENA) 20 MCG/24HR IUD 1 each by Intrauterine route once Dec 2018      Multiple Vitamins-Minerals (MULTIVITAMIN WITH MINERALS) tablet Take 1 tablet by mouth daily      pregabalin (Lyrica) 25 MG capsule Take 1 capsule (25 mg total) by mouth 2 (two) times daily 60 capsule 5    VITAMIN D PO Take 5,000 Units by mouth daily       No current facility-administered medications on file prior to visit.       Objective:     Neurological Examination:  Vitals:    08/07/21 1025   Weight: 59 kg (130 lb)   Height: 1.651 m (5\' 5" )   Body mass index is 21.63 kg/m.  Ms. Dhaliwal is fully awake, alert, and oriented. She answers questions, and  follows commands appropriately. Speech is clear. No skin rashes are noted. There is no diplopia, nystagmus, eyelid ptosis, facial weakness, tongue atrophy, fasciculations or tongue weakness.   There is no fixation on arm rotating test.  She is able to raise both arms above shoulder level symmetrically.  She has intentional tremors in both hands. She is able to open and close her fists symmetrically.      Data Review:    Results for orders placed or performed during the hospital encounter of 10/12/19   MRI Brain W WO Contrast    Narrative    HISTORY: Weakness in both lower extremities. Progressive muscular  rigidity and spasm. Chronic fatigue. Correlate with MRI examination of  the cervical spine and thoracic spine March 02, 2019.    COMPARISON: No previous brain imaging is available. MRI examinations of  the cervical spine and thoracic spine dated March 02, 2019 are available.    TECHNIQUE: Non-contrast and contrast enhanced imaging was obtained.  The  patient received  6 mL of   Gadavist contrast material via IV  administration.    FINDINGS: The brain volume is normal. No mass, hemorrhage, or  hydrocephalus is detected.  The volume and the signal of the white  matter is normal in both cerebral hemispheres and in both cerebellar  hemispheres.  The basilar cisterns are clear. The midline structures of  the brain appear intact. The foramen magnum is patent.  There is no  evidence of a Chiari malformation.    The diffusion weighted images are normal.  There is no evidence of acute  or recent infarction.  There are appropriate flow-voids in the major  arterial and venous structures.     After the administration of contrast material, there is normal pattern  of enhancement of the brain parenchyma and meningeal surfaces.    There is a mild generalized thickening of the calvarium.    The paranasal sinuses are aerated.  There is a small amount of mucosal  thickening in the ethmoid and maxillary sinus bilaterally.  No fluid is  seen in the paranasal sinuses.  The nasopharynx has a normal  configuration.      Impression    1. No intracranial mass, hemorrhage, or hydrocephalus is detected.  2. The volume and the signal of the white matter is normal in both  cerebral hemispheres and in both cerebellar hemispheres.  3. There is normal pattern of enhancement of the brain parenchyma and  meningeal surfaces.  4. There is a mild generalized thickening of the calvarium.    Theodoro Doing, MD   10/12/2019 2:41 PM       Laboratory:    Lab Results   Component Value Date    GLU 83 09/22/2018     Lab Results   Component Value Date    NA 135 (L) 09/22/2018    K 3.8 09/22/2018    CO2 22 09/22/2018    AST 15 09/21/2018    ALT 17 09/21/2018    B12 706 09/22/2018    VITB6 11 10/12/2018    TSH 0.58 09/22/2018    WBC 4.91 09/22/2018    HGB 11.5 09/22/2018    HCT 35.5 09/22/2018    PLT 316  09/22/2018       Assessment and Plan:       I had a long conversation with the patient via video conference.  She continues to have muscle rigidity and multiple falls, which potentially could be triggered by intense emotions  or getting surprised.  On further questioning, she reports that she feels very fatigued during the daytime and her sleep is disrupted at night.  She had at least one episode of sleep paralysis.    I am going to request an in lab overnight polysomnogram followed by the Multiple Sleep Latency Test to evaluate for REM onset sleep, which is diagnostic of narcolepsy.  Depend on the results, we can try modafinil or Sunosi.    In the meantime, I am going to increase Lyrica to 125 mg twice a day since she continues to have leg pain.  She also has been having hand tremors, which may improve with a higher dose of Lyrica.  I provided new prescription for Lyrica.  I advised her to continue treatment with amitriptyline 25 mg at bedtime.  I reassured her that the tremors are most likely related to essential tremors rather than parkinsonism.    She is going to pursue genetic testing at the [facility] clinic in March.    I encouraged her to continue physical therapy and the home exercise program.    Ms. Dix will follow up in our office in 4 months or earlier if needed.  She can contact the office should he have any further questions or concerns.    Sincerely,    Marinda Elk M.D    Board Certified, American Board of Psychiatry and Neurology  [facility]    Phone #: [phone]  Fax #: [phone] Morene Antu Nicholson)  [address]  [place], [place]. [address]      This note was generated by the [facility] speech recognition program and may contain inherent errors or omissions not intended by the user. Grammatical errors, random word insertions, deletions, pronoun errors and incomplete sentences are occasional consequences of this technology due to software limitations. Not all errors are caught or  corrected. If there are questions or concerns about the content of this note or information contained within the body of this dictation they should be addressed directly with the author for clarification.

## 2021-12-10 ENCOUNTER — Encounter (HOSPITAL_BASED_OUTPATIENT_CLINIC_OR_DEPARTMENT_OTHER): Payer: Self-pay | Admitting: Neurology

## 2021-12-10 ENCOUNTER — Other Ambulatory Visit (HOSPITAL_BASED_OUTPATIENT_CLINIC_OR_DEPARTMENT_OTHER): Payer: Self-pay | Admitting: Neurology

## 2021-12-10 DIAGNOSIS — G4720 Circadian rhythm sleep disorder, unspecified type: Secondary | ICD-10-CM

## 2021-12-10 DIAGNOSIS — G47411 Narcolepsy with cataplexy: Secondary | ICD-10-CM

## 2021-12-10 DIAGNOSIS — R296 Repeated falls: Secondary | ICD-10-CM

## 2021-12-10 DIAGNOSIS — R5382 Chronic fatigue, unspecified: Secondary | ICD-10-CM

## 2021-12-10 DIAGNOSIS — G4719 Other hypersomnia: Secondary | ICD-10-CM

## 2022-01-28 ENCOUNTER — Ambulatory Visit: Payer: 59

## 2022-01-29 ENCOUNTER — Encounter: Payer: Self-pay | Admitting: Neurology

## 2022-01-29 ENCOUNTER — Ambulatory Visit: Payer: 59

## 2022-02-19 ENCOUNTER — Telehealth (INDEPENDENT_AMBULATORY_CARE_PROVIDER_SITE_OTHER): Payer: 59 | Admitting: Neurology

## 2022-02-19 ENCOUNTER — Encounter (HOSPITAL_BASED_OUTPATIENT_CLINIC_OR_DEPARTMENT_OTHER): Payer: Self-pay | Admitting: Neurology

## 2022-02-19 VITALS — Ht 65.0 in | Wt 128.0 lb

## 2022-02-19 DIAGNOSIS — G4719 Other hypersomnia: Secondary | ICD-10-CM

## 2022-02-19 DIAGNOSIS — R296 Repeated falls: Secondary | ICD-10-CM

## 2022-02-19 DIAGNOSIS — G47411 Narcolepsy with cataplexy: Secondary | ICD-10-CM

## 2022-02-19 DIAGNOSIS — M792 Neuralgia and neuritis, unspecified: Secondary | ICD-10-CM

## 2022-02-19 DIAGNOSIS — R270 Ataxia, unspecified: Secondary | ICD-10-CM

## 2022-02-19 MED ORDER — PREGABALIN 150 MG PO CAPS
150.00 mg | ORAL_CAPSULE | Freq: Two times a day (BID) | ORAL | 5 refills | Status: DC
Start: 2022-02-19 — End: 2022-10-13

## 2022-02-19 MED ORDER — AMITRIPTYLINE HCL 25 MG PO TABS
25.0000 mg | ORAL_TABLET | Freq: Every evening | ORAL | 5 refills | Status: DC
Start: 2022-02-19 — End: 2022-10-13

## 2022-02-19 NOTE — Progress Notes (Unsigned)
[facility] Neurology Telemedicine Follow up Visit    Date of visit: February 19, 2022    Verbal Consent      Verbal consent has been obtained from the patient to conduct a Video Visit encounter to minimize exposure to COVID-19: Yes       Patient ID:  Patient's Name: Alexis Gentry  Date of Birth: July 28, 1996  MRN: [id number]    Subjective:      HPI    The patient reports that she continues to have weakness and stiffness in both legs.    She continues to fall down after having intense emotions such as getting surprised.  She has been taking Lyrica 150 mg twice a day and amitriptyline 25 mg at bedtime.  She reports no new side effects.    She continues to have disrupted sleep.  She feels fatigued during the daytime.  She has not been able to arrange the sleep study.  She has not been able to pursue the genetic testing at the [facility].    She has been taking vitamin D 5000 units per day.  The following portions of the patient's history were reviewed and updated as appropriate: allergies, current medications, past family history, past medical history, past social history, past surgical history and problem list.    Review of Systems   Constitution: Negative for fever.   HENT:  Negative for hearing loss.    Eyes:  Negative for double vision.   Respiratory:  Negative for cough and shortness of breath.    Skin:  Negative for rash.   Musculoskeletal:  Positive for back pain. Negative for neck pain.   Gastrointestinal:  Negative for diarrhea.   Neurological:  Positive for focal weakness, paresthesias and tremors. Negative for seizures.   Psychiatric/Behavioral:  Negative for memory loss. The patient does not have insomnia.      Past Medical History:   Diagnosis Date    Pain     bilat lower ext    Weakness      Current Outpatient Medications on File Prior to Visit   Medication Sig Dispense Refill    amitriptyline (ELAVIL) 25 MG tablet Take 1 tablet (25 mg) by mouth nightly 30 tablet 5    diazePAM (VALIUM) 5 MG tablet Take 1  tablet (5 mg total) by mouth every 12 (twelve) hours as needed (Muscle spasms and stiffness) 60 tablet 4    levonorgestrel (MIRENA) 20 MCG/24HR IUD 1 Intra Uterine Device (52 mg) by Intrauterine route once Dec 2018      Multiple Vitamins-Minerals (MULTIVITAMIN WITH MINERALS) tablet Take 1 tablet by mouth daily      pregabalin (LYRICA) 150 MG capsule Take 1 capsule (150 mg) by mouth 2 (two) times daily      VITAMIN D PO Take 5,000 Units by mouth daily       No current facility-administered medications on file prior to visit.       Objective:     Neurological Examination:  Vitals:    02/19/22 1423   Weight: 58.1 kg (128 lb)   Height: 1.651 m (5\' 5" )   Body mass index is 21.3 kg/m.  Alexis Gentry is drowsy, but interactive.  She answers questions, and follows commands appropriately. Speech is clear. No skin rashes are noted.   There is no diplopia, nystagmus, eyelid ptosis, facial weakness, tongue atrophy, fasciculations or tongue weakness.   There is no fixation on arm rotating test.  She is able to raise both arms above shoulder  level symmetrically.  She has intentional tremors in both hands. She is able to open and close her fists symmetrically.      Data Review:    Results for orders placed or performed during the hospital encounter of 10/12/19   MRI Brain W WO Contrast    Narrative    HISTORY: Weakness in both lower extremities. Progressive muscular  rigidity and spasm. Chronic fatigue. Correlate with MRI examination of  the cervical spine and thoracic spine March 02, 2019.    COMPARISON: No previous brain imaging is available. MRI examinations of  the cervical spine and thoracic spine dated March 02, 2019 are available.    TECHNIQUE: Non-contrast and contrast enhanced imaging was obtained.  The  patient received  6 mL of   Gadavist contrast material via IV  administration.    FINDINGS: The brain volume is normal. No mass, hemorrhage, or  hydrocephalus is detected.  The volume and the signal of the white  matter is  normal in both cerebral hemispheres and in both cerebellar  hemispheres.  The basilar cisterns are clear. The midline structures of  the brain appear intact. The foramen magnum is patent.  There is no  evidence of a Chiari malformation.    The diffusion weighted images are normal.  There is no evidence of acute  or recent infarction.  There are appropriate flow-voids in the major  arterial and venous structures.     After the administration of contrast material, there is normal pattern  of enhancement of the brain parenchyma and meningeal surfaces.    There is a mild generalized thickening of the calvarium.    The paranasal sinuses are aerated.  There is a small amount of mucosal  thickening in the ethmoid and maxillary sinus bilaterally.  No fluid is  seen in the paranasal sinuses.  The nasopharynx has a normal  configuration.      Impression    1. No intracranial mass, hemorrhage, or hydrocephalus is detected.  2. The volume and the signal of the white matter is normal in both  cerebral hemispheres and in both cerebellar hemispheres.  3. There is normal pattern of enhancement of the brain parenchyma and  meningeal surfaces.  4. There is a mild generalized thickening of the calvarium.    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 Ataxia  R27.0       3. Multiple falls  R29.6       4. Daytime hypersomnolence  G47.19       5. Neuropathic pain  M79.2 amitriptyline (ELAVIL) 25 MG tablet     pregabalin (LYRICA) 150 MG capsule        I had a long conversation with the patient  via video  conference.  She continues to have muscle rigidity and multiple falls, which potentially could be triggered by intense emotions or getting surprised.  She continues to feel very fatigued during the daytime and her sleep is disrupted at night.    She is going to have an in lab overnight polysomnogram followed by the Multiple Sleep Latency Test to evaluate for REM onset sleep, which is diagnostic of narcolepsy.  Depend on the results, we can try modafinil or Sunosi.    I also advised her to to continue to take Lyrica 150 mg twice a day in combination with amitriptyline 25 mg at bedtime.  I provided new prescriptions for Lyrica and amitriptyline.    She is going to pursue genetic testing at the [facility].    I encouraged her to continue to perform the home exercises regularly.    Ms. Vandenheuvel will follow up in our office in 3 months or earlier if needed.  She can contact the office should he have any further questions or concerns.    Sincerely,    Marinda Elk M.D    Board Certified, American Board of Psychiatry and Neurology  [facility]    Phone #: [phone]  Fax #: [phone] [facility])  [address]  [place], [place]. [address]      This note was generated by the [facility] speech recognition program and may contain inherent errors or omissions not intended by the user. Grammatical errors, random word insertions, deletions, pronoun errors and incomplete sentences are occasional consequences of this technology due to software limitations. Not all errors are caught or corrected. If there are questions or concerns about the content of this note or information contained within the body of this dictation they should be addressed directly with the author for clarification.

## 2022-02-26 ENCOUNTER — Ambulatory Visit: Payer: 59 | Attending: Neurology

## 2022-02-26 DIAGNOSIS — R000 Tachycardia, unspecified: Secondary | ICD-10-CM

## 2022-02-26 DIAGNOSIS — G4719 Other hypersomnia: Secondary | ICD-10-CM | POA: Insufficient documentation

## 2022-02-26 DIAGNOSIS — F518 Other sleep disorders not due to a substance or known physiological condition: Secondary | ICD-10-CM | POA: Insufficient documentation

## 2022-02-26 DIAGNOSIS — R296 Repeated falls: Secondary | ICD-10-CM | POA: Insufficient documentation

## 2022-02-26 DIAGNOSIS — G47411 Narcolepsy with cataplexy: Secondary | ICD-10-CM | POA: Insufficient documentation

## 2022-02-26 DIAGNOSIS — R202 Paresthesia of skin: Secondary | ICD-10-CM

## 2022-02-26 DIAGNOSIS — G4720 Circadian rhythm sleep disorder, unspecified type: Secondary | ICD-10-CM | POA: Insufficient documentation

## 2022-02-26 DIAGNOSIS — R5382 Chronic fatigue, unspecified: Secondary | ICD-10-CM | POA: Insufficient documentation

## 2022-02-27 ENCOUNTER — Ambulatory Visit (HOSPITAL_BASED_OUTPATIENT_CLINIC_OR_DEPARTMENT_OTHER): Payer: 59

## 2022-02-27 DIAGNOSIS — G47411 Narcolepsy with cataplexy: Secondary | ICD-10-CM

## 2022-02-27 DIAGNOSIS — G4719 Other hypersomnia: Secondary | ICD-10-CM

## 2022-02-27 DIAGNOSIS — R5382 Chronic fatigue, unspecified: Secondary | ICD-10-CM

## 2022-02-27 DIAGNOSIS — R202 Paresthesia of skin: Secondary | ICD-10-CM

## 2022-02-27 DIAGNOSIS — G4720 Circadian rhythm sleep disorder, unspecified type: Secondary | ICD-10-CM

## 2022-02-27 DIAGNOSIS — R000 Tachycardia, unspecified: Secondary | ICD-10-CM

## 2022-02-27 DIAGNOSIS — F518 Other sleep disorders not due to a substance or known physiological condition: Secondary | ICD-10-CM

## 2022-02-27 DIAGNOSIS — R296 Repeated falls: Secondary | ICD-10-CM

## 2022-02-27 NOTE — Procedures (Signed)
Patient: Alexis Gentry     MRN#: [id number]     Date: 02/27/2022        Time: 6:58 AM     [name] is a 26 y.o. female who uses a walker scheduled for Diagnostic NPSG with next day MSLT sleep study. Referring physician's order reviewed. Patient was oriented to the sleep lab environment and emergency fire exit route. Individualized sleep education provided to the patient. All questions answered. Plan of care for the entire patient stay was explained and patient was oriented and encouraged to use call bell and ask questions at any point during the study.  Patient's sister stayed as caregiver with her.     Patient displayed isolate respiratory event, few mild snoring, some sleep related body movements, period of restless sleep on Supine, and minimal sleep fragmentation. Patient slept mostly on lateral and partially lateral and less on Supine body positions. All stages of sleep were reached.  Period of increased leg movement was not noted.   Minimum oxygen saturation (= Oxygen -NADIR) was 94 %.    EKG was normal sinus.   Oxygen supplement applied? No.     The patient met the criteria to continue with daytime MSLT and was not discharged from lab in the morning.     Information written on this document is recorded by overnight monitoring sleep technologist. Refer to final analysis on the sleep study report of interpreting physician.

## 2022-03-02 NOTE — Progress Notes (Signed)
PSG done.

## 2022-03-02 NOTE — Progress Notes (Signed)
MSLT done.

## 2022-03-02 NOTE — Progress Notes (Signed)
MULTIPLE SLEEP LATENCY TEST    Date of study: 02/27/2022    Indication: Daytime hypersomnolence    Scoring Table    Naps Sleep Latency REM        1 3.6 minutes No   2 1.6 minutes Yes   3 2.3 minutes No   4 7.6 minutes No   5 3.6 minutes No        Average Sleep Latency: 3.8 minutes 1/5 REMs       IMPRESSION:   Abnormal study indicating severe hypersomnolence, with 1 sleep onset REM noted.  These findings are suggestive, but not diagnostic, of narcolepsy in the proper clinical setting.  Clinical correlation recommended.      Leylanie Woodmansee B. Stacy Gardner, MD  Director, [facility]  Assistant Professor, [employer], [employer] Certified in Neurology, Sleep Medicine, Clinical Neurophysiology and Vascular Neurology      MSLT NORMALS: In normal patients, the MSLT sleep latency is longer than 15 minutes and only 0 to 1 REM periods occur during the test. A mean sleep latency during the MSLT between 10 and 15 minutes represents mild sleepiness; less than 10 minutes is considered to be abnormal; shorter than 5 minutes is considered to be severe (pathologic) sleepiness. Two or more REM periods in the five naps is characteristic of narcolepsy. However, it is important to note that REM may occur during naps in patients who are sleep deprived or withdrawing from REM depressing medications such as anti-depressants and stimulants.

## 2022-04-12 ENCOUNTER — Telehealth (INDEPENDENT_AMBULATORY_CARE_PROVIDER_SITE_OTHER): Payer: 59 | Admitting: Neurology

## 2022-04-12 ENCOUNTER — Telehealth: Payer: Self-pay

## 2022-04-12 ENCOUNTER — Encounter (HOSPITAL_BASED_OUTPATIENT_CLINIC_OR_DEPARTMENT_OTHER): Payer: Self-pay | Admitting: Neurology

## 2022-04-12 VITALS — Ht 65.0 in | Wt 130.0 lb

## 2022-04-12 DIAGNOSIS — R270 Ataxia, unspecified: Secondary | ICD-10-CM

## 2022-04-12 DIAGNOSIS — M792 Neuralgia and neuritis, unspecified: Secondary | ICD-10-CM

## 2022-04-12 DIAGNOSIS — G4719 Other hypersomnia: Secondary | ICD-10-CM

## 2022-04-12 DIAGNOSIS — F518 Other sleep disorders not due to a substance or known physiological condition: Secondary | ICD-10-CM

## 2022-04-12 DIAGNOSIS — R296 Repeated falls: Secondary | ICD-10-CM

## 2022-04-12 DIAGNOSIS — G4720 Circadian rhythm sleep disorder, unspecified type: Secondary | ICD-10-CM

## 2022-04-12 DIAGNOSIS — G43009 Migraine without aura, not intractable, without status migrainosus: Secondary | ICD-10-CM

## 2022-04-12 DIAGNOSIS — G47411 Narcolepsy with cataplexy: Secondary | ICD-10-CM

## 2022-04-12 MED ORDER — UBRELVY 100 MG PO TABS
100.0000 mg | ORAL_TABLET | Freq: Every day | ORAL | 5 refills | Status: DC | PRN
Start: 2022-04-12 — End: 2022-10-13

## 2022-04-12 MED ORDER — MODAFINIL 100 MG PO TABS
100.0000 mg | ORAL_TABLET | Freq: Every morning | ORAL | 4 refills | Status: DC
Start: 2022-04-12 — End: 2022-07-09

## 2022-04-12 NOTE — Telephone Encounter (Signed)
Insurance want to know if:    Has the patient tried and failed (or has contraindications to) 2 preferred triptans?

## 2022-04-12 NOTE — Telephone Encounter (Signed)
Received request via [employer] and beginning benefits investigation for Ubrelvy 100mg     Directions: Take 1 tablet (100 mg) by mouth daily as needed (Migraine)     PA required. Initiated via CMM (Key: [id number])

## 2022-04-12 NOTE — Progress Notes (Unsigned)
[facility] Neurology Telemedicine Follow up Visit    Date of visit: April 12, 2022    Verbal Consent      Verbal consent has been obtained from the patient to conduct a Video Visit encounter to minimize exposure to COVID-19: Yes     Patient ID:  Patient's Name: Alexis Gentry  Date of Birth: 06-Apr-1996  MRN: [id number]    Subjective:      HPI    The patient reports that she continues to have weakness and stiffness in both legs.    She continues to fall down after having intense emotions such as getting surprised.  She has been taking Lyrica 150 mg twice a day and amitriptyline 25 mg at bedtime.  She reports no new side effects.    She continues to have disrupted sleep.  She feels fatigued during the daytime.  She has not been able to arrange the sleep study.  She has not been able to pursue the genetic testing at the [facility].    She has been taking vitamin D 5000 units per day.  The following portions of the patient's history were reviewed and updated as appropriate: allergies, current medications, past family history, past medical history, past social history, past surgical history and problem list.    Review of Systems   Constitution: Negative for fever.   HENT:  Negative for hearing loss.    Eyes:  Negative for double vision.   Respiratory:  Negative for cough and shortness of breath.    Skin:  Negative for rash.   Musculoskeletal:  Positive for back pain. Negative for neck pain.   Gastrointestinal:  Negative for diarrhea.   Neurological:  Positive for focal weakness, paresthesias and tremors. Negative for seizures.   Psychiatric/Behavioral:  Negative for memory loss. The patient does not have insomnia.      Past Medical History:   Diagnosis Date    Pain     bilat lower ext    Weakness      Current Outpatient Medications on File Prior to Visit   Medication Sig Dispense Refill    amitriptyline (ELAVIL) 25 MG tablet Take 1 tablet (25 mg) by mouth nightly 30 tablet 5    diazePAM (VALIUM) 5 MG tablet Take 1  tablet (5 mg total) by mouth every 12 (twelve) hours as needed (Muscle spasms and stiffness) 60 tablet 4    levonorgestrel (MIRENA) 20 MCG/24HR IUD 1 Intra Uterine Device (52 mg) by Intrauterine route once Dec 2018      Multiple Vitamins-Minerals (MULTIVITAMIN WITH MINERALS) tablet Take 1 tablet by mouth daily      pregabalin (LYRICA) 150 MG capsule Take 1 capsule (150 mg) by mouth 2 (two) times daily 60 capsule 5    VITAMIN D PO Take 5,000 Units by mouth daily       No current facility-administered medications on file prior to visit.       Objective:     Neurological Examination:  Vitals:    04/12/22 0810   Weight: 59 kg (130 lb)   Height: 1.651 m (5\' 5" )   Body mass index is 21.63 kg/m.  Alexis Gentry is drowsy, but interactive.  She answers questions, and follows commands appropriately. Speech is clear. No skin rashes are noted.   There is no diplopia, nystagmus, eyelid ptosis, facial weakness, tongue atrophy, fasciculations or tongue weakness.   There is no fixation on arm rotating test.  She is able to raise both arms above shoulder level  symmetrically.  She has intentional tremors in both hands. She is able to open and close her fists symmetrically.      Data Review:    Results for orders placed or performed during the hospital encounter of 10/12/19   MRI Brain W WO Contrast    Narrative    HISTORY: Weakness in both lower extremities. Progressive muscular  rigidity and spasm. Chronic fatigue. Correlate with MRI examination of  the cervical spine and thoracic spine March 02, 2019.    COMPARISON: No previous brain imaging is available. MRI examinations of  the cervical spine and thoracic spine dated March 02, 2019 are available.    TECHNIQUE: Non-contrast and contrast enhanced imaging was obtained.  The  patient received  6 mL of   Gadavist contrast material via IV  administration.    FINDINGS: The brain volume is normal. No mass, hemorrhage, or  hydrocephalus is detected.  The volume and the signal of the  white  matter is normal in both cerebral hemispheres and in both cerebellar  hemispheres.  The basilar cisterns are clear. The midline structures of  the brain appear intact. The foramen magnum is patent.  There is no  evidence of a Chiari malformation.    The diffusion weighted images are normal.  There is no evidence of acute  or recent infarction.  There are appropriate flow-voids in the major  arterial and venous structures.     After the administration of contrast material, there is normal pattern  of enhancement of the brain parenchyma and meningeal surfaces.    There is a mild generalized thickening of the calvarium.    The paranasal sinuses are aerated.  There is a small amount of mucosal  thickening in the ethmoid and maxillary sinus bilaterally.  No fluid is  seen in the paranasal sinuses.  The nasopharynx has a normal  configuration.      Impression    1. No intracranial mass, hemorrhage, or hydrocephalus is detected.  2. The volume and the signal of the white matter is normal in both  cerebral hemispheres and in both cerebellar hemispheres.  3. There is normal pattern of enhancement of the brain parenchyma and  meningeal surfaces.  4. There is a mild generalized thickening of the calvarium.    Theodoro Doing, MD   10/12/2019 2:41 PM       Laboratory:    Lab Results   Component Value Date    GLU 83 09/22/2018     Component Value Date    NA 135 (L) 09/22/2018    K 3.8 09/22/2018    CO2 22 09/22/2018    AST 15 09/21/2018    ALT 17 09/21/2018    B12 706 09/22/2018    VITB6 11 10/12/2018    TSH 0.58 09/22/2018    WBC 4.91 09/22/2018    HGB 11.5 09/22/2018    HCT 35.5 09/22/2018    PLT 316 09/22/2018       Assessment and Plan:       ICD-10-CM    1. Multiple falls  R29.6       2. 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 Daytime hypersomnolence  G47.19       5. 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 Disrupted sleep-wake cycle  G47.20     F51.8         I had a long conversation  with the patient via video  conference.  She continues to have muscle rigidity and multiple falls, which potentially could be triggered by intense emotions or getting surprised.  She continues to feel very fatigued during the daytime and her sleep is disrupted at night.    She is going to have an in lab overnight polysomnogram followed by the Multiple Sleep Latency Test to evaluate for REM onset sleep, which is diagnostic of narcolepsy.  Depend on the results, we can try modafinil or Sunosi.    I also advised her to to continue to take Lyrica 150 mg twice a day in combination with amitriptyline 25 mg at bedtime.  I provided new prescriptions for Lyrica and amitriptyline.    She is going to pursue genetic testing at the [facility] clinic.    I encouraged her to continue to perform the home exercises regularly.    Ms. Boshers will follow up in our office in 3 months or earlier if needed.  She can contact the office should he have any further questions or concerns.    Sincerely,    Marinda Elk M.D    Board Certified, American Board of Psychiatry and Neurology  [facility]    Phone #: [phone]  Fax #: [phone] [facility] [place])  [address]  [place], [place]. [address]      This note was generated by the [facility] speech recognition program and may contain inherent errors or omissions not intended by the user. Grammatical errors, random word insertions, deletions, pronoun errors and incomplete sentences are occasional consequences of this technology due to software limitations. Not all errors are caught or corrected. If there are questions or concerns about the content of this note or information contained within the body of this dictation they should be addressed directly with the author for clarification.

## 2022-04-13 ENCOUNTER — Other Ambulatory Visit (HOSPITAL_BASED_OUTPATIENT_CLINIC_OR_DEPARTMENT_OTHER): Payer: Self-pay | Admitting: Neurology

## 2022-04-13 DIAGNOSIS — G43009 Migraine without aura, not intractable, without status migrainosus: Secondary | ICD-10-CM

## 2022-04-13 MED ORDER — ELETRIPTAN HYDROBROMIDE 40 MG PO TABS
40.0000 mg | ORAL_TABLET | ORAL | 5 refills | Status: DC | PRN
Start: 2022-04-13 — End: 2023-02-17

## 2022-04-13 MED ORDER — NARATRIPTAN HCL 2.5 MG PO TABS
2.5000 mg | ORAL_TABLET | ORAL | 5 refills | Status: DC | PRN
Start: 2022-04-13 — End: 2023-06-21

## 2022-04-13 NOTE — Telephone Encounter (Signed)
She would have to try and fail 2 preferred triptans (or has contraindications) before Bernita Raisin is approved.    Would you like to hold off on Ubrelvy, until she has tried 2 triptans?

## 2022-04-23 DIAGNOSIS — E559 Vitamin D deficiency, unspecified: Secondary | ICD-10-CM | POA: Diagnosis not present

## 2022-04-23 DIAGNOSIS — Z124 Encounter for screening for malignant neoplasm of cervix: Secondary | ICD-10-CM | POA: Diagnosis not present

## 2022-04-23 DIAGNOSIS — E010 Iodine-deficiency related diffuse (endemic) goiter: Secondary | ICD-10-CM | POA: Diagnosis not present

## 2022-04-23 DIAGNOSIS — Z0000 Encounter for general adult medical examination without abnormal findings: Secondary | ICD-10-CM | POA: Diagnosis not present

## 2022-04-23 DIAGNOSIS — Z1231 Encounter for screening mammogram for malignant neoplasm of breast: Secondary | ICD-10-CM | POA: Diagnosis not present

## 2022-04-23 DIAGNOSIS — Z23 Encounter for immunization: Secondary | ICD-10-CM | POA: Diagnosis not present

## 2022-04-23 DIAGNOSIS — Z1331 Encounter for screening for depression: Secondary | ICD-10-CM | POA: Diagnosis not present

## 2022-04-30 ENCOUNTER — Ambulatory Visit: Payer: BLUE CROSS/BLUE SHIELD | Admitting: Physician Assistant

## 2022-05-19 ENCOUNTER — Encounter (HOSPITAL_BASED_OUTPATIENT_CLINIC_OR_DEPARTMENT_OTHER): Payer: Self-pay | Admitting: Neurology

## 2022-06-15 DIAGNOSIS — Z6822 Body mass index (BMI) 22.0-22.9, adult: Secondary | ICD-10-CM | POA: Diagnosis not present

## 2022-06-15 DIAGNOSIS — Z124 Encounter for screening for malignant neoplasm of cervix: Secondary | ICD-10-CM | POA: Diagnosis not present

## 2022-06-15 DIAGNOSIS — N760 Acute vaginitis: Secondary | ICD-10-CM | POA: Diagnosis not present

## 2022-06-15 DIAGNOSIS — Z01419 Encounter for gynecological examination (general) (routine) without abnormal findings: Secondary | ICD-10-CM | POA: Diagnosis not present

## 2022-06-15 DIAGNOSIS — Z113 Encounter for screening for infections with a predominantly sexual mode of transmission: Secondary | ICD-10-CM | POA: Diagnosis not present

## 2022-06-16 LAB — HM PAP SMEAR: HM Pap smear: NEGATIVE

## 2022-07-07 ENCOUNTER — Encounter: Payer: Self-pay | Admitting: Physician Assistant

## 2022-07-07 ENCOUNTER — Ambulatory Visit: Payer: 59 | Admitting: Physician Assistant

## 2022-07-07 VITALS — BP 100/70 | HR 81 | Temp 98.0°F | Ht 64.5 in | Wt 132.5 lb

## 2022-07-07 DIAGNOSIS — R270 Ataxia, unspecified: Secondary | ICD-10-CM | POA: Diagnosis not present

## 2022-07-07 DIAGNOSIS — M792 Neuralgia and neuritis, unspecified: Secondary | ICD-10-CM

## 2022-07-07 DIAGNOSIS — R5383 Other fatigue: Secondary | ICD-10-CM | POA: Diagnosis not present

## 2022-07-07 DIAGNOSIS — Z8669 Personal history of other diseases of the nervous system and sense organs: Secondary | ICD-10-CM

## 2022-07-07 LAB — URINALYSIS, ROUTINE W REFLEX MICROSCOPIC
Bilirubin Urine: NEGATIVE
Hgb urine dipstick: NEGATIVE
Ketones, ur: NEGATIVE
Leukocytes,Ua: NEGATIVE
Nitrite: NEGATIVE
Specific Gravity, Urine: 1.01 (ref 1.000–1.030)
Total Protein, Urine: NEGATIVE
Urine Glucose: NEGATIVE
Urobilinogen, UA: 2 — AB (ref 0.0–1.0)
pH: 7.5 (ref 5.0–8.0)

## 2022-07-07 LAB — TSH: TSH: 0.66 u[IU]/mL (ref 0.35–5.50)

## 2022-07-07 LAB — COMPREHENSIVE METABOLIC PANEL
ALT: 15 U/L (ref 0–35)
AST: 15 U/L (ref 0–37)
Albumin: 4.4 g/dL (ref 3.5–5.2)
Alkaline Phosphatase: 57 U/L (ref 39–117)
BUN: 15 mg/dL (ref 6–23)
CO2: 28 mEq/L (ref 19–32)
Calcium: 9.5 mg/dL (ref 8.4–10.5)
Chloride: 101 mEq/L (ref 96–112)
Creatinine, Ser: 0.59 mg/dL (ref 0.40–1.20)
GFR: 123.53 mL/min (ref >60.00)
Glucose, Bld: 80 mg/dL (ref 70–99)
Potassium: 5.2 mEq/L — ABNORMAL HIGH (ref 3.5–5.1)
Sodium: 135 mEq/L (ref 135–145)
Total Bilirubin: 0.8 mg/dL (ref 0.2–1.2)
Total Protein: 7.1 g/dL (ref 6.0–8.3)

## 2022-07-07 LAB — CBC WITH DIFFERENTIAL/PLATELET
Basophils Absolute: 0 10*3/uL (ref 0.0–0.1)
Basophils Relative: 1 % (ref 0.0–3.0)
Eosinophils Absolute: 0.1 10*3/uL (ref 0.0–0.7)
Eosinophils Relative: 2.9 % (ref 0.0–5.0)
HCT: 34.7 % — ABNORMAL LOW (ref 36.0–46.0)
Hemoglobin: 11 g/dL — ABNORMAL LOW (ref 12.0–15.0)
Lymphocytes Relative: 41.7 % (ref 12.0–46.0)
Lymphs Abs: 1.9 10*3/uL (ref 0.7–4.0)
MCHC: 31.8 g/dL (ref 30.0–36.0)
MCV: 89.3 fl (ref 78.0–100.0)
Monocytes Absolute: 0.4 10*3/uL (ref 0.1–1.0)
Monocytes Relative: 9.8 % (ref 3.0–12.0)
Neutro Abs: 2.1 10*3/uL (ref 1.4–7.7)
Neutrophils Relative %: 44.6 % (ref 43.0–77.0)
Platelets: 304 10*3/uL (ref 150.0–400.0)
RBC: 3.88 Mil/uL (ref 3.87–5.11)
RDW: 13.2 % (ref 11.5–15.5)
WBC: 4.6 10*3/uL (ref 4.0–10.5)

## 2022-07-07 LAB — HEMOGLOBIN A1C: Hgb A1c MFr Bld: 5.6 % (ref 4.6–6.5)

## 2022-07-07 LAB — VITAMIN D 25 HYDROXY (VIT D DEFICIENCY, FRACTURES): VITD: 26.02 ng/mL — ABNORMAL LOW (ref 30.00–100.00)

## 2022-07-07 LAB — IBC + FERRITIN
Ferritin: 79.1 ng/mL (ref 10.0–291.0)
Iron: 75 ug/dL (ref 42–145)
Saturation Ratios: 24.1 % (ref 20.0–50.0)
TIBC: 310.8 ug/dL (ref 250.0–450.0)
Transferrin: 222 mg/dL (ref 212.0–360.0)

## 2022-07-07 LAB — VITAMIN B12: Vitamin B-12: 335 pg/mL (ref 211–911)

## 2022-07-07 NOTE — Patient Instructions (Signed)
It was great to see you!  Referral to [facility] is our primary concern today -- I think you would benefit from an expert opinion and care team to try to figure out what is going on  We will update blood work today for further evaluation of fatigue  If a referral was placed today --> you will be contacted for an appointment. Please note that routine referrals can sometimes take up to 3-4 weeks to process. Please call our office if you haven't heard anything after this time frame.  If blood work, urine studies, or any imaging was ordered today -->  we will release your results to you on your MyChart account (if you have chosen to sign up for this) with further instructions. You may see the results before I do, but when I review them I will send you a message with my report or have my staff call you if things need to be discussed. Please reply to my message with any questions.   Take care,  Inda Coke PA-C
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
## 2022-07-08 ENCOUNTER — Other Ambulatory Visit: Payer: Self-pay | Admitting: Physician Assistant

## 2022-07-08 DIAGNOSIS — G47411 Narcolepsy with cataplexy: Secondary | ICD-10-CM | POA: Diagnosis not present

## 2022-07-08 DIAGNOSIS — E010 Iodine-deficiency related diffuse (endemic) goiter: Secondary | ICD-10-CM | POA: Diagnosis not present

## 2022-07-08 DIAGNOSIS — R296 Repeated falls: Secondary | ICD-10-CM | POA: Diagnosis not present

## 2022-07-08 DIAGNOSIS — E875 Hyperkalemia: Secondary | ICD-10-CM

## 2022-07-08 DIAGNOSIS — S060X1D Concussion with loss of consciousness of 30 minutes or less, subsequent encounter: Secondary | ICD-10-CM | POA: Diagnosis not present

## 2022-07-08 MED ORDER — VITAMIN D (ERGOCALCIFEROL) 1.25 MG (50000 UNIT) PO CAPS: 50000.0000 [IU] | ORAL_CAPSULE | ORAL | 0 refills | Status: DC

## 2022-07-09 ENCOUNTER — Encounter (HOSPITAL_BASED_OUTPATIENT_CLINIC_OR_DEPARTMENT_OTHER): Payer: Self-pay

## 2022-07-09 ENCOUNTER — Other Ambulatory Visit (HOSPITAL_BASED_OUTPATIENT_CLINIC_OR_DEPARTMENT_OTHER): Payer: Self-pay

## 2022-07-09 ENCOUNTER — Other Ambulatory Visit: Payer: Self-pay | Admitting: Internal Medicine

## 2022-07-09 DIAGNOSIS — G4719 Other hypersomnia: Secondary | ICD-10-CM

## 2022-07-09 DIAGNOSIS — S060X1A Concussion with loss of consciousness of 30 minutes or less, initial encounter: Secondary | ICD-10-CM

## 2022-07-09 DIAGNOSIS — G47411 Narcolepsy with cataplexy: Secondary | ICD-10-CM

## 2022-07-09 DIAGNOSIS — G4720 Circadian rhythm sleep disorder, unspecified type: Secondary | ICD-10-CM

## 2022-07-09 DIAGNOSIS — H5213 Myopia, bilateral: Secondary | ICD-10-CM | POA: Diagnosis not present

## 2022-07-09 DIAGNOSIS — H40053 Ocular hypertension, bilateral: Secondary | ICD-10-CM | POA: Diagnosis not present

## 2022-07-09 DIAGNOSIS — H35411 Lattice degeneration of retina, right eye: Secondary | ICD-10-CM | POA: Diagnosis not present

## 2022-07-09 MED ORDER — MODAFINIL 100 MG PO TABS
100.0000 mg | ORAL_TABLET | Freq: Every morning | ORAL | 4 refills | Status: DC
Start: 2022-07-09 — End: 2022-10-13

## 2022-07-19 ENCOUNTER — Encounter (INDEPENDENT_AMBULATORY_CARE_PROVIDER_SITE_OTHER): Payer: Self-pay

## 2022-07-19 ENCOUNTER — Encounter (HOSPITAL_BASED_OUTPATIENT_CLINIC_OR_DEPARTMENT_OTHER): Payer: Self-pay

## 2022-07-19 NOTE — Progress Notes (Deleted)
PA initiated on CMM for Ubrelvy 100mg . Key: [id number] - PA Case ID: [id number]  Waiting on insurance decision.

## 2022-07-19 NOTE — Progress Notes (Signed)
error 

## 2022-07-19 NOTE — Progress Notes (Signed)
PA initiated on CMM for Ubrevly 100mg . Key: [id number] - Rx #: [id number]  Waiting on insurance decision.

## 2022-07-22 ENCOUNTER — Encounter (HOSPITAL_BASED_OUTPATIENT_CLINIC_OR_DEPARTMENT_OTHER): Payer: Self-pay

## 2022-08-04 ENCOUNTER — Other Ambulatory Visit (INDEPENDENT_AMBULATORY_CARE_PROVIDER_SITE_OTHER): Payer: Self-pay | Admitting: Internal Medicine

## 2022-08-04 DIAGNOSIS — R519 Headache, unspecified: Secondary | ICD-10-CM | POA: Diagnosis not present

## 2022-08-04 DIAGNOSIS — R42 Dizziness and giddiness: Secondary | ICD-10-CM | POA: Diagnosis not present

## 2022-08-31 ENCOUNTER — Encounter: Payer: Self-pay | Admitting: Physician Assistant

## 2022-10-11 ENCOUNTER — Ambulatory Visit (HOSPITAL_BASED_OUTPATIENT_CLINIC_OR_DEPARTMENT_OTHER): Payer: Commercial Managed Care - HMO | Admitting: Neurology

## 2022-10-13 ENCOUNTER — Encounter (HOSPITAL_BASED_OUTPATIENT_CLINIC_OR_DEPARTMENT_OTHER): Payer: Self-pay | Admitting: Neurology

## 2022-10-13 ENCOUNTER — Telehealth (INDEPENDENT_AMBULATORY_CARE_PROVIDER_SITE_OTHER): Payer: Commercial Managed Care - HMO | Admitting: Neurology

## 2022-10-13 VITALS — Ht 65.0 in | Wt 135.0 lb

## 2022-10-13 DIAGNOSIS — M792 Neuralgia and neuritis, unspecified: Secondary | ICD-10-CM

## 2022-10-13 DIAGNOSIS — R270 Ataxia, unspecified: Secondary | ICD-10-CM

## 2022-10-13 DIAGNOSIS — G43009 Migraine without aura, not intractable, without status migrainosus: Secondary | ICD-10-CM

## 2022-10-13 DIAGNOSIS — F518 Other sleep disorders not due to a substance or known physiological condition: Secondary | ICD-10-CM

## 2022-10-13 DIAGNOSIS — G4719 Other hypersomnia: Secondary | ICD-10-CM

## 2022-10-13 DIAGNOSIS — R5382 Chronic fatigue, unspecified: Secondary | ICD-10-CM

## 2022-10-13 DIAGNOSIS — G47411 Narcolepsy with cataplexy: Secondary | ICD-10-CM

## 2022-10-13 DIAGNOSIS — G4720 Circadian rhythm sleep disorder, unspecified type: Secondary | ICD-10-CM

## 2022-10-13 MED ORDER — NURTEC 75 MG PO TBDP
75.00 mg | ORAL_TABLET | ORAL | 5 refills | Status: DC
Start: 2022-10-13 — End: 2023-02-17

## 2022-10-13 MED ORDER — MODAFINIL 100 MG PO TABS
150.0000 mg | ORAL_TABLET | Freq: Every morning | ORAL | 4 refills | Status: DC
Start: 2022-10-13 — End: 2023-06-21

## 2022-10-13 MED ORDER — PREGABALIN 150 MG PO CAPS
150.00 mg | ORAL_CAPSULE | Freq: Two times a day (BID) | ORAL | 5 refills | Status: DC
Start: 2022-10-13 — End: 2023-02-17

## 2022-10-13 MED ORDER — AMITRIPTYLINE HCL 50 MG PO TABS
50.0000 mg | ORAL_TABLET | Freq: Every evening | ORAL | 5 refills | Status: DC
Start: 2022-10-13 — End: 2023-02-17

## 2022-10-13 NOTE — Progress Notes (Signed)
[facility] Neurology Telemedicine Follow up Visit    Date of visit: October 13, 2022    Verbal Consent      Verbal consent has been obtained from the patient to conduct a Video Visit encounter     Patient ID:  Patient's Name: Alexis Gentry  Date of Birth: 12-30-1995  MRN: [id number]    Subjective:      HPI    The patient reports that she continues to have weakness and stiffness in both legs and hands.    She continues to fall down after having intense emotions such as getting surprised, but the frequency of falling is decreased.    She had an overnight polysomnogram, which revealed mild airway resistance.    The MSLT study revealed short sleep onset with one episode of REM onset sleep.  She continues to have disrupted sleep.    She has been taking Lyrica 150 mg twice a day, amitriptyline 25 mg at bedtime, and modafinil 100 mg once in the morning.  She has noted improvement in daytime sleepiness with modafinil.  She reports no new side effects.    However, she has been having migraine headaches almost every day.  She has been taking Ubrelvy as needed, with symptomatic relief.  The headaches tend to be associated with sensitivity to light, sound, and nausea.    The following portions of the patient's history were reviewed and updated as appropriate: allergies, current medications, past family history, past medical history, past social history, past surgical history and problem list.      Past Medical History:   Diagnosis Date    Pain     bilat lower ext    Weakness      Current Outpatient Medications on File Prior to Visit   Medication Sig Dispense Refill    amitriptyline (ELAVIL) 25 MG tablet Take 1 tablet (25 mg) by mouth nightly 30 tablet 5    diazePAM (VALIUM) 5 MG tablet Take 1 tablet (5 mg total) by mouth every 12 (twelve) hours as needed (Muscle spasms and stiffness) 60 tablet 4    levonorgestrel (MIRENA) 20 MCG/24HR IUD 1 Intra Uterine Device (52 mg) by Intrauterine route once Dec 2018      modafinil  (PROVIGIL) 100 MG tablet Take 1 tablet (100 mg) by mouth every morning 30 tablet 4    Multiple Vitamins-Minerals (MULTIVITAMIN WITH MINERALS) tablet Take 1 tablet by mouth daily      pregabalin (LYRICA) 150 MG capsule Take 1 capsule (150 mg) by mouth 2 (two) times daily 60 capsule 5    VITAMIN D PO Take 5,000 Units by mouth daily      eletriptan (RELPAX) 40 MG tablet Take 1 tablet (40 mg) by mouth as needed for Migraine May repeat in 2 hours if needed (Patient not taking: Reported on 10/13/2022) 12 tablet 5    naratriptan (Amerge) 2.5 MG tablet Take 1 tablet (2.5 mg) by mouth as needed for Migraine May repeat 3 hours later if needed (Patient not taking: Reported on 10/13/2022) 12 tablet 5    ubrogepant (Ubrelvy) 100 MG tablet Take 1 tablet (100 mg) by mouth daily as needed (Migraine) (Patient not taking: Reported on 10/13/2022) 16 tablet 5     No current facility-administered medications on file prior to visit.       Objective:     Neurological Examination:  Vitals:    10/13/22 0837   Weight: 61.2 kg (135 lb)   Height: 1.651 m (5\' 5" )  Body mass index is 22.47 kg/m.  Alexis Gentry is awake and alert.  She answers questions, and follows commands appropriately. Speech is clear. No skin rashes are noted.   There is no diplopia, nystagmus, eyelid ptosis, facial weakness, tongue atrophy, fasciculations or tongue weakness.   There is no fixation on arm rotating test.  She is able to raise both arms above shoulder level symmetrically.  She has intentional tremors in both hands. She is able to open and close her fists symmetrically.      Data Reviewed:    Overnight polysomnogram from February 26, 2022:    Indication:  Excessive daytime hypersomnolence.     SUMMARY OF FINDINGS:  The total sleep time was 348.5 minutes, with a sleep efficiency of 88.5%.    The latency to sleep onset was 9.7 minutes with an REM latency of 67.0   minutes.  The patient achieved all stages of sleep during the study.  The   arousal index was 10.8 with an  awakening index of 4.0.  Most arousals seen   were spontaneous.     The periodic limb movement index was 2.6.     The total apnea-hypopnea index using 4% oxygen desaturation criteria was   0.5, 4.6 using 3% criteria.  The respiratory disturbance index was 5.7, AHI  and RDI were slightly higher in non-supine positions.  The minimum oxygen   saturation during sleep was 94%, with an average oxygen saturation during   sleep of 97.2%.     Sleep architecture is noted to be relatively fragmented.  Heart rate ranged  from 70s to 90s, regular rhythm.     IMPRESSION:  1.  Mild/borderline upper airway resistance syndrome, without clear   evidence for more significant sleep disturbed breathing or sleep apnea.    Conservative measures include elevating the head of the bed or using a   wedge pillow, weight loss if appropriate for goal BMI less than 25.  2.  No evidence for significant periodic limb movements of sleep.  3.  First night effect, given the fragmented sleep architecture, not   uncommon in laboratory setting.  4.  Multiple sleep latency tests was performed in conjunction with the   study, results reported separately.     Thank you for referring this patient for evaluation.        D: 03/02/2022 09:58 PM by Alesia Morin. Samuel Jester[place] MD ([id number])  T: 03/03/2022 12:58 AM by DCM [id number]    MULTIPLE SLEEP LATENCY TEST     Date of study: 02/27/2022     Indication: Daytime hypersomnolence     Scoring Table     Naps Sleep Latency REM           1 3.6 minutes No   2 1.6 minutes Yes   3 2.3 minutes No   4 7.6 minutes No   5 3.6 minutes No           Average Sleep Latency: 3.8 minutes 1/5 REMs         IMPRESSION:   Abnormal study indicating severe hypersomnolence, with 1 sleep onset REM noted.  These findings are suggestive, but not diagnostic, of narcolepsy in the proper clinical setting.  Clinical correlation recommended.        Eric B. Samuel Jester, MD  Director, [facility]  Assistant Professor, [employer]   of Medicine, [facility]  Board Certified in Neurology, Sleep Medicine, Clinical Neurophysiology and Vascular Neurology  MSLT NORMALS: In normal patients, the MSLT sleep latency is longer than 15 minutes and only 0 to 1 REM periods occur during the test. A mean sleep latency during the MSLT between 10 and 15 minutes represents mild sleepiness; less than 10 minutes is considered to be abnormal; shorter than 5 minutes is considered to be severe (pathologic) sleepiness. Two or more REM periods in the five naps is characteristic of narcolepsy. However, it is important to note that REM may occur during naps in patients who are sleep deprived or withdrawing from REM depressing medications such as anti-depressants and stimulants.               Electronically signed by Marshall Cork, MD at 03/02/2022 10:02 PM    Results for orders placed or performed during the hospital encounter of 10/12/19   MRI Brain W WO Contrast    Narrative    HISTORY: Weakness in both lower extremities. Progressive muscular  rigidity and spasm. Chronic fatigue. Correlate with MRI examination of  the cervical spine and thoracic spine March 02, 2019.    COMPARISON: No previous brain imaging is available. MRI examinations of  the cervical spine and thoracic spine dated March 02, 2019 are available.    TECHNIQUE: Non-contrast and contrast enhanced imaging was obtained.  The  patient received  6 mL of   Gadavist contrast material via IV  administration.    FINDINGS: The brain volume is normal. No mass, hemorrhage, or  hydrocephalus is detected.  The volume and the signal of the white  matter is normal in both cerebral hemispheres and in both cerebellar  hemispheres.  The basilar cisterns are clear. The midline structures of  the brain appear intact. The foramen magnum is patent.  There is no  evidence of a Chiari malformation.    The diffusion weighted images are normal.  There is no evidence of acute  or recent infarction.  There are appropriate  flow-voids in the major  arterial and venous structures.     After the administration of contrast material, there is normal pattern  of enhancement of the brain parenchyma and meningeal surfaces.    There is a mild generalized thickening of the calvarium.    The paranasal sinuses are aerated.  There is a small amount of mucosal  thickening in the ethmoid and maxillary sinus bilaterally.  No fluid is  seen in the paranasal sinuses.  The nasopharynx has a normal  configuration.      Impression    1. No intracranial mass, hemorrhage, or hydrocephalus is detected.  2. The volume and the signal of the white matter is normal in both  cerebral hemispheres and in both cerebellar hemispheres.  3. There is normal pattern of enhancement of the brain parenchyma and  meningeal surfaces.  4. There is a mild generalized thickening of the calvarium.    Melina Copa, MD   10/12/2019 2:41 PM       Laboratory:    Lab Results   Component Value Date    GLU 83 09/22/2018     Component Value Date    NA 135 (L) 09/22/2018    K 3.8 09/22/2018    CO2 22 09/22/2018    AST 15 09/21/2018    ALT 17 09/21/2018    B12 706 09/22/2018    VITB6 11 10/12/2018    TSH 0.58 09/22/2018    WBC 4.91 09/22/2018    HGB 11.5 09/22/2018    HCT 35.5 09/22/2018  PLT 316 09/22/2018       Assessment and Plan:      Diagnosis ICD-10-CM Associated Order   1. Migraine without aura and without status migrainosus, not intractable  G43.009 Nurtec 75 MG Tablet Dispersible      2. Daytime hypersomnolence  G47.19 modafinil (PROVIGIL) 100 MG tablet      3. 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 Ataxia  R27.0       5. Chronic fatigue  R53.82       6. Disrupted sleep-wake cycle  G47.20 modafinil (PROVIGIL) 100 MG tablet    F51.8       7. Neuropathic pain  M79.2 pregabalin (LYRICA) 150 MG capsule     amitriptyline (ELAVIL) 50 MG tablet          I had a long conversation with the patient via video conference.  She continues to have muscle rigidity and  multiple falls, which potentially could be triggered by intense emotions or getting surprised.  She had an overnight polysomnogram, followed by a Multiple Sleep Latency Test (MSLT), which revealed severe hypersomnolence with one sleep onset REM, which is suggestive, but not diagnostic of narcolepsy.  She continues to feel very fatigued during the daytime and her sleep is disrupted at night.    I advised her to increase modafinil to 150 mg once in the morning in order to optimize her therapy.  I reviewed potential side effects, including jitteriness, increased blood pressure, and exacerbation of headaches.    I advised her to continue to take pregabalin 150 mg twice a day.  I am also going to increase amitriptyline to 50 mg at bedtime to improve her sleep.    I would like to try Nurtec ODT 75 mg to see whether it works better than Ubrelvy 100 mg for abortive migraine therapy.  I explained how this medication works by blocking the CGRP receptors.  I reviewed potential side effects, including nausea.  I provided new prescriptions for modafinil, Lyrica, amitriptyline, and Nurtec ODT.    She is going to pursue further evaluation at [facility].  I encouraged her to continue to perform the home exercises regularly.    Alexis Gentry will follow up in our office in 3 months or earlier if needed.  She can contact the office should he have any further questions or concerns.    Sincerely,    Marinda Elk M.D    Board Certified, American Board of Psychiatry and Neurology  [facility]    Phone #: [phone]  Fax #: [phone] [place])  [address]  [place], [place]. [address]      This note was generated by the [facility] speech recognition program and may contain inherent errors or omissions not intended by the user. Grammatical errors, random word insertions, deletions, pronoun errors and incomplete sentences are occasional consequences of this technology due to software limitations. Not  all errors are caught or corrected. If there are questions or concerns about the content of this note or information contained within the body of this dictation they should be addressed directly with the author for clarification.

## 2022-10-17 ENCOUNTER — Telehealth: Payer: Self-pay

## 2022-10-17 NOTE — Telephone Encounter (Signed)
Received request via [employer] and beginning benefits investigation for Nurtec ODT 75mg     Unable to verify if [facility] requires a prior authorization. Holland Falling says patient has other primary insurance that must be billed first.    LVM for patient re: need to clarify insurance situation

## 2022-10-19 NOTE — Telephone Encounter (Signed)
Spoke with patient re: insurance. Confirmed she only has [employer]. Advised patient to call [employer] and have them update their system and follow up with this Liaison Kirsten Mckone that prior authorization can be submitted for Nurtec. Patient voiced understanding.
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
## 2023-02-09 ENCOUNTER — Ambulatory Visit: Payer: Commercial Managed Care - PPO | Admitting: Physician Assistant

## 2023-02-10 ENCOUNTER — Other Ambulatory Visit: Payer: Self-pay | Admitting: Physician Assistant

## 2023-02-10 ENCOUNTER — Ambulatory Visit: Payer: Commercial Managed Care - PPO | Admitting: Physician Assistant

## 2023-02-10 VITALS — BP 120/80 | HR 100 | Temp 97.8°F | Ht 64.5 in | Wt 138.0 lb

## 2023-02-10 DIAGNOSIS — R200 Anesthesia of skin: Secondary | ICD-10-CM | POA: Diagnosis not present

## 2023-02-10 DIAGNOSIS — R202 Paresthesia of skin: Secondary | ICD-10-CM

## 2023-02-10 DIAGNOSIS — E559 Vitamin D deficiency, unspecified: Secondary | ICD-10-CM

## 2023-02-10 DIAGNOSIS — M79609 Pain in unspecified limb: Secondary | ICD-10-CM | POA: Diagnosis not present

## 2023-02-10 DIAGNOSIS — R251 Tremor, unspecified: Secondary | ICD-10-CM

## 2023-02-10 DIAGNOSIS — R229 Localized swelling, mass and lump, unspecified: Secondary | ICD-10-CM

## 2023-02-10 DIAGNOSIS — E010 Iodine-deficiency related diffuse (endemic) goiter: Secondary | ICD-10-CM | POA: Diagnosis not present

## 2023-02-10 LAB — CBC WITH DIFFERENTIAL/PLATELET
Basophils Absolute: 0 10*3/uL (ref 0.0–0.1)
Basophils Relative: 0.7 % (ref 0.0–3.0)
Eosinophils Absolute: 0.1 10*3/uL (ref 0.0–0.7)
Eosinophils Relative: 2.4 % (ref 0.0–5.0)
HCT: 37.2 % (ref 36.0–46.0)
Hemoglobin: 12.1 g/dL (ref 12.0–15.0)
Lymphocytes Relative: 37.8 % (ref 12.0–46.0)
Lymphs Abs: 1.7 10*3/uL (ref 0.7–4.0)
MCHC: 32.5 g/dL (ref 30.0–36.0)
MCV: 89.6 fl (ref 78.0–100.0)
Monocytes Absolute: 0.5 10*3/uL (ref 0.1–1.0)
Monocytes Relative: 10.4 % (ref 3.0–12.0)
Neutro Abs: 2.2 10*3/uL (ref 1.4–7.7)
Neutrophils Relative %: 48.7 % (ref 43.0–77.0)
Platelets: 332 10*3/uL (ref 150.0–400.0)
RBC: 4.16 Mil/uL (ref 3.87–5.11)
RDW: 13.3 % (ref 11.5–15.5)
WBC: 4.5 10*3/uL (ref 4.0–10.5)

## 2023-02-10 LAB — COMPREHENSIVE METABOLIC PANEL
ALT: 29 U/L (ref 0–35)
AST: 16 U/L (ref 0–37)
Albumin: 4.6 g/dL (ref 3.5–5.2)
Alkaline Phosphatase: 59 U/L (ref 39–117)
BUN: 17 mg/dL (ref 6–23)
CO2: 26 mEq/L (ref 19–32)
Calcium: 9.4 mg/dL (ref 8.4–10.5)
Chloride: 102 mEq/L (ref 96–112)
Creatinine, Ser: 0.77 mg/dL (ref 0.40–1.20)
GFR: 105.29 mL/min (ref >60.00)
Glucose, Bld: 91 mg/dL (ref 70–99)
Potassium: 4.1 mEq/L (ref 3.5–5.1)
Sodium: 136 mEq/L (ref 135–145)
Total Bilirubin: 1 mg/dL (ref 0.2–1.2)
Total Protein: 7.7 g/dL (ref 6.0–8.3)

## 2023-02-10 LAB — TSH: TSH: 0.57 u[IU]/mL (ref 0.35–5.50)

## 2023-02-10 LAB — VITAMIN D 25 HYDROXY (VIT D DEFICIENCY, FRACTURES): VITD: 19.75 ng/mL — ABNORMAL LOW (ref 30.00–100.00)

## 2023-02-10 LAB — VITAMIN B12: Vitamin B-12: 350 pg/mL (ref 211–911)

## 2023-02-10 MED ORDER — VITAMIN D (ERGOCALCIFEROL) 1.25 MG (50000 UNIT) PO CAPS: 50000.0000 [IU] | ORAL_CAPSULE | ORAL | 0 refills | Status: DC

## 2023-02-10 NOTE — Patient Instructions (Signed)
It was great to see you!  We are going to get an ultrasound of your thyroid and your back Please call [facility] to schedule: [phone]  I will also order blood work today  If new/worsening symptom(s), please go to the ER  We will continue to work on trying to get you into neurology  Take care,  [name] PA-C
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
## 2023-02-11 ENCOUNTER — Encounter: Payer: Self-pay | Admitting: Physician Assistant

## 2023-02-15 ENCOUNTER — Encounter: Payer: Self-pay | Admitting: Neurology

## 2023-02-17 ENCOUNTER — Telehealth (INDEPENDENT_AMBULATORY_CARE_PROVIDER_SITE_OTHER): Payer: Commercial Managed Care - HMO | Admitting: Neurology

## 2023-02-17 ENCOUNTER — Encounter (HOSPITAL_BASED_OUTPATIENT_CLINIC_OR_DEPARTMENT_OTHER): Payer: Self-pay | Admitting: Neurology

## 2023-02-17 VITALS — Ht 65.0 in | Wt 142.0 lb

## 2023-02-17 DIAGNOSIS — R296 Repeated falls: Secondary | ICD-10-CM

## 2023-02-17 DIAGNOSIS — G4719 Other hypersomnia: Secondary | ICD-10-CM

## 2023-02-17 DIAGNOSIS — G43009 Migraine without aura, not intractable, without status migrainosus: Secondary | ICD-10-CM

## 2023-02-17 DIAGNOSIS — R5382 Chronic fatigue, unspecified: Secondary | ICD-10-CM

## 2023-02-17 DIAGNOSIS — M792 Neuralgia and neuritis, unspecified: Secondary | ICD-10-CM

## 2023-02-17 DIAGNOSIS — G47411 Narcolepsy with cataplexy: Secondary | ICD-10-CM

## 2023-02-17 DIAGNOSIS — R208 Other disturbances of skin sensation: Secondary | ICD-10-CM

## 2023-02-17 DIAGNOSIS — R270 Ataxia, unspecified: Secondary | ICD-10-CM

## 2023-02-17 MED ORDER — PREGABALIN 150 MG PO CAPS
150.0000 mg | ORAL_CAPSULE | Freq: Two times a day (BID) | ORAL | 5 refills | Status: DC
Start: 2023-02-17 — End: 2023-09-06

## 2023-02-17 MED ORDER — NURTEC 75 MG PO TBDP
75.0000 mg | ORAL_TABLET | ORAL | 5 refills | Status: DC
Start: 2023-02-17 — End: 2024-05-23

## 2023-02-17 MED ORDER — AMITRIPTYLINE HCL 50 MG PO TABS
50.0000 mg | ORAL_TABLET | Freq: Every evening | ORAL | 5 refills | Status: DC
Start: 2023-02-17 — End: 2023-08-30

## 2023-02-17 NOTE — Progress Notes (Signed)
[facility] Neurology Telemedicine Follow up Visit    Date of visit: February 17, 2023    Verbal Consent      Verbal consent has been obtained from the patient to conduct a Video Visit encounter     Patient ID:  Patient's Name: Alexis Gentry  Date of Birth: 12-Apr-1996  MRN: [id number]    Subjective:      HPI    The patient reports that she continues to have weakness and stiffness in both legs and hands.    She continues to fall down after having intense emotions such as getting surprised.  She continues to have headaches almost every day.    She has been taking pregabalin 150 mg twice a day and amitriptyline 50 mg at bedtime for migraine prophylaxis.  She also takes Nurtec ODT 75 mg as needed, with symptomatic relief.  The headaches tend to be associated with sensitivity to light, sound, and nausea.    She has been taking modafinil 150 mg once in the morning, which has improved the daytime sleepiness.  She reports no new side effects.    However, she has noted burning sensation on the right side of the face.  She also has discomfort to touch on her legs.  The following portions of the patient's history were reviewed and updated as appropriate: allergies, current medications, past family history, past medical history, past social history, past surgical history and problem list.      Past Medical History:   Diagnosis Date    Pain     bilat lower ext    Weakness      Current Outpatient Medications on File Prior to Visit   Medication Sig Dispense Refill    amitriptyline (ELAVIL) 50 MG tablet Take 1 tablet (50 mg) by mouth nightly 30 tablet 5    diazePAM (VALIUM) 5 MG tablet Take 1 tablet (5 mg total) by mouth every 12 (twelve) hours as needed (Muscle spasms and stiffness) 60 tablet 4    levonorgestrel (MIRENA) 20 MCG/24HR IUD 1 Intra Uterine Device (52 mg) by Intrauterine route once Dec 2018      modafinil (PROVIGIL) 100 MG tablet Take 1.5 tablets (150 mg) by mouth every morning 45 tablet 4    Multiple Vitamins-Minerals  (MULTIVITAMIN WITH MINERALS) tablet Take 1 tablet by mouth daily      pregabalin (LYRICA) 150 MG capsule Take 1 capsule (150 mg) by mouth 2 (two) times daily 60 capsule 5    pregabalin (LYRICA) 150 MG capsule Take 1 capsule (150 mg) by mouth 2 (two) times daily 60 capsule 5    VITAMIN D PO Take 5,000 Units by mouth daily      eletriptan (RELPAX) 40 MG tablet Take 1 tablet (40 mg) by mouth as needed for Migraine May repeat in 2 hours if needed (Patient not taking: Reported on 10/13/2022) 12 tablet 5    naratriptan (Amerge) 2.5 MG tablet Take 1 tablet (2.5 mg) by mouth as needed for Migraine May repeat 3 hours later if needed (Patient not taking: Reported on 10/13/2022) 12 tablet 5    Nurtec 75 MG Tablet Dispersible Take 1 tablet (75 mg) by mouth every other day Take at the onset of migraine wait 24 hour to take it again if needed (Patient not taking: Reported on 02/17/2023) 16 tablet 5     No current facility-administered medications on file prior to visit.       Objective:     Neurological Examination:  Vitals:  02/17/23 1550   Weight: 64.4 kg (142 lb)   Height: 1.651 m (5\' 5" )   Body mass index is 23.63 kg/m.  Ms. Hansen is awake and alert.  She answers questions, and follows commands appropriately. Speech is clear. No skin rashes are noted.   There is no diplopia, nystagmus, eyelid ptosis, facial weakness, tongue atrophy, fasciculations or tongue weakness.   There is no fixation on arm rotating test.  She is able to raise both arms above shoulder level symmetrically.  She has intentional tremors in both hands. She is able to open and close her fists symmetrically.      Data Reviewed:    Overnight polysomnogram from February 26, 2022:    Indication:  Excessive daytime hypersomnolence.     SUMMARY OF FINDINGS:  The total sleep time was 348.5 minutes, with a sleep efficiency of 88.5%.    The latency to sleep onset was 9.7 minutes with an REM latency of 67.0   minutes.  The patient achieved all stages of sleep during the  study.  The   arousal index was 10.8 with an awakening index of 4.0.  Most arousals seen   were spontaneous.     The periodic limb movement index was 2.6.     The total apnea-hypopnea index using 4% oxygen desaturation criteria was   0.5, 4.6 using 3% criteria.  The respiratory disturbance index was 5.7, AHI  and RDI were slightly higher in non-supine positions.  The minimum oxygen   saturation during sleep was 94%, with an average oxygen saturation during   sleep of 97.2%.     Sleep architecture is noted to be relatively fragmented.  Heart rate ranged  from 70s to 90s, regular rhythm.     IMPRESSION:  1.  Mild/borderline upper airway resistance syndrome, without clear   evidence for more significant sleep disturbed breathing or sleep apnea.    Conservative measures include elevating the head of the bed or using a   wedge pillow, weight loss if appropriate for goal BMI less than 25.  2.  No evidence for significant periodic limb movements of sleep.  3.  First night effect, given the fragmented sleep architecture, not   uncommon in laboratory setting.  4.  Multiple sleep latency tests was performed in conjunction with the   study, results reported separately.     Thank you for referring this patient for evaluation.        D: 03/02/2022 09:58 PM by Eleanora Neighbor. Stacy Gardner, MD ([id number])  T: 03/03/2022 12:58 AM by DCM [id number]    MULTIPLE SLEEP LATENCY TEST     Date of study: 02/27/2022     Indication: Daytime hypersomnolence     Scoring Table     Naps Sleep Latency REM           1 3.6 minutes No   2 1.6 minutes Yes   3 2.3 minutes No   4 7.6 minutes No   5 3.6 minutes No           Average Sleep Latency: 3.8 minutes 1/5 REMs         IMPRESSION:   Abnormal study indicating severe hypersomnolence, with 1 sleep onset REM noted.  These findings are suggestive, but not diagnostic, of narcolepsy in the proper clinical setting.  Clinical correlation recommended.        Eric B. Stacy Gardner, MD  Director, [facility] Sleep Disorders  Program  Assistant Professor, [employer] of [address] of [employer], [place]  Research scientist (medical) in Neurology, Sleep Medicine, Clinical Neurophysiology and Vascular Neurology        MSLT NORMALS: In normal patients, the MSLT sleep latency is longer than 15 minutes and only 0 to 1 REM periods occur during the test. A mean sleep latency during the MSLT between 10 and 15 minutes represents mild sleepiness; less than 10 minutes is considered to be abnormal; shorter than 5 minutes is considered to be severe (pathologic) sleepiness. Two or more REM periods in the five naps is characteristic of narcolepsy. However, it is important to note that REM may occur during naps in patients who are sleep deprived or withdrawing from REM depressing medications such as anti-depressants and stimulants.               Electronically signed by Campbell Lerner, MD at 03/02/2022 10:02 PM    Results for orders placed or performed during the hospital encounter of 10/12/19   MRI Brain W WO Contrast    Narrative    HISTORY: Weakness in both lower extremities. Progressive muscular  rigidity and spasm. Chronic fatigue. Correlate with MRI examination of  the cervical spine and thoracic spine March 02, 2019.    COMPARISON: No previous brain imaging is available. MRI examinations of  the cervical spine and thoracic spine dated March 02, 2019 are available.    TECHNIQUE: Non-contrast and contrast enhanced imaging was obtained.  The  patient received  6 mL of   Gadavist contrast material via IV  administration.    FINDINGS: The brain volume is normal. No mass, hemorrhage, or  hydrocephalus is detected.  The volume and the signal of the white  matter is normal in both cerebral hemispheres and in both cerebellar  hemispheres.  The basilar cisterns are clear. The midline structures of  the brain appear intact. The foramen magnum is patent.  There is no  evidence of a Chiari malformation.    The diffusion weighted images are normal.  There is no evidence  of acute  or recent infarction.  There are appropriate flow-voids in the major  arterial and venous structures.     After the administration of contrast material, there is normal pattern  of enhancement of the brain parenchyma and meningeal surfaces.    There is a mild generalized thickening of the calvarium.    The paranasal sinuses are aerated.  There is a small amount of mucosal  thickening in the ethmoid and maxillary sinus bilaterally.  No fluid is  seen in the paranasal sinuses.  The nasopharynx has a normal  configuration.      Impression    1. No intracranial mass, hemorrhage, or hydrocephalus is detected.  2. The volume and the signal of the white matter is normal in both  cerebral hemispheres and in both cerebellar hemispheres.  3. There is normal pattern of enhancement of the brain parenchyma and  meningeal surfaces.  4. There is a mild generalized thickening of the calvarium.    Theodoro Doing, MD   10/12/2019 2:41 PM       Laboratory:      Component Value Date    NA 135 (L) 09/22/2018    K 3.8 09/22/2018    CO2 22 09/22/2018    AST 15 09/21/2018    ALT 17 09/21/2018    B12 706 09/22/2018    VITB6 11 10/12/2018    TSH 0.58 09/22/2018    WBC 4.91 09/22/2018    HGB 11.5 09/22/2018  HCT 35.5 09/22/2018    PLT 316 09/22/2018       Assessment and Plan:      Diagnosis ICD-10-CM Associated Order   1. 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 Daytime hypersomnolence  G47.19 Vitamin D,25 OH, Total      4. Burning sensation  R20.8 Vitamin B6     Vitamin D,25 OH, Total     TSH, Abn Reflex to Free T4, Serum     Magnesium     Lyme Ab Tot Rflx to WB IGG/IGM      5. Ataxia  R27.0 Vitamin B12     Vitamin B6     TSH, Abn Reflex to Free T4, Serum      6. Chronic fatigue  R53.82 Vitamin B12     Vitamin D,25 OH, Total     TSH, Abn Reflex to  Free T4, Serum     Lyme Ab Tot Rflx to WB IGG/IGM      7. Multiple falls  R29.6 Vitamin B12     Vitamin B6     Vitamin D,25 OH, Total     Lyme Ab Tot Rflx to WB IGG/IGM      8. Neuropathic pain  M79.2 pregabalin (LYRICA) 150 MG capsule     amitriptyline (ELAVIL) 50 MG tablet     Vitamin B12     Vitamin B6     Vitamin D,25 OH, Total     Lyme Ab Tot Rflx to WB IGG/IGM          I had a long conversation with the patient via video conference.  She continues to have muscle rigidity and multiple falls, which potentially could be triggered by intense emotions or getting surprised.  She had an overnight polysomnogram, followed by a Multiple Sleep Latency Test (MSLT), which revealed severe hypersomnolence with one sleep onset REM, which is suggestive, but not diagnostic of narcolepsy.  She is going to have further evaluation at [facility] at the end of July.    I advised her to continue to take modafinil 150 mg once in the morning, which has been working better  I reviewed potential side effects, including jitteriness, increased blood pressure, and exacerbation of headaches.    I advised her to continue to take pregabalin 150 mg twice a day in combination with amitriptyline 50 mg at bedtime.  I advised her to continue to take Nurtec ODT 75 mg as needed, which has worked better than Ubrelvy 100 mg for abortive migraine therapy.  I explained how this medication works by blocking the CGRP receptors.  I provided new prescriptions for modafinil, Lyrica, amitriptyline, and Nurtec ODT.    She has noted burning sensation on her face.  I would like to request serum testing for potentially associated conditions, including vitamin B6 level, vitamin B12 level, Lyme titers, magnesium level, folic acid level, and thyroid function.  I am also going to check her vitamin D level to make sure that there is no deficiency.    Ms. Littig will follow up in our office in 3-1/2 months or earlier if needed.  She can contact the office  should he have any further questions or concerns.  Sincerely,    Marinda Elk M.D    Board Certified, American Board of Psychiatry and Neurology  [facility]    Phone #: [phone]  Fax #: [phone] [address])  [id number]  [place], [place]. [address]      This note was generated by the [facility] speech recognition program and may contain inherent errors or omissions not intended by the user. Grammatical errors, random word insertions, deletions, pronoun errors and incomplete sentences are occasional consequences of this technology due to software limitations. Not all errors are caught or corrected. If there are questions or concerns about the content of this note or information contained within the body of this dictation they should be addressed directly with the author for clarification.

## 2023-02-18 ENCOUNTER — Telehealth (HOSPITAL_BASED_OUTPATIENT_CLINIC_OR_DEPARTMENT_OTHER): Payer: Self-pay

## 2023-02-18 NOTE — Telephone Encounter (Signed)
Received request via [employer] and beginning benefits investigation for Nurtec 75mg  ODT    Patient has Acupuncturist    PA Required. Initiated via CMM (Key [id number]). Submitted PA with appt notes. Pending determination.

## 2023-02-21 NOTE — Telephone Encounter (Signed)
Prior Auth Outcome for Nurtec 75mg  ODT    [employer]    [place] Status APPROVED     Case ID [id number]    Effective Dates 02/18/2023-02/16/2024    Approved Qty/Day supply #16/30     Expected copay $150.00 - Eligible for savings card    Eligible to fill prescription at [facility]. Liaison will reach out to patient and offer [facility] Team services.

## 2023-02-25 ENCOUNTER — Other Ambulatory Visit: Payer: Commercial Managed Care - PPO
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
## 2023-03-18 ENCOUNTER — Ambulatory Visit
Admission: RE | Admit: 2023-03-18 | Discharge: 2023-03-18 | Disposition: A | Discharge status: Home / Self Care | Payer: Commercial Managed Care - PPO | Source: Ambulatory Visit | Attending: Physician Assistant | Admitting: Physician Assistant

## 2023-03-18 DIAGNOSIS — R229 Localized swelling, mass and lump, unspecified: Secondary | ICD-10-CM

## 2023-03-18 DIAGNOSIS — E042 Nontoxic multinodular goiter: Secondary | ICD-10-CM | POA: Diagnosis not present

## 2023-03-18 DIAGNOSIS — E010 Iodine-deficiency related diffuse (endemic) goiter: Secondary | ICD-10-CM

## 2023-03-18 DIAGNOSIS — D434 Neoplasm of uncertain behavior of spinal cord: Secondary | ICD-10-CM | POA: Diagnosis not present

## 2023-03-18 DIAGNOSIS — E041 Nontoxic single thyroid nodule: Secondary | ICD-10-CM | POA: Diagnosis not present

## 2023-03-20 ENCOUNTER — Ambulatory Visit (INDEPENDENT_AMBULATORY_CARE_PROVIDER_SITE_OTHER): Payer: Commercial Managed Care - PPO

## 2023-03-20 ENCOUNTER — Ambulatory Visit
Admission: EM | Admit: 2023-03-20 | Discharge: 2023-03-20 | Disposition: A | Discharge status: Home / Self Care | Payer: Commercial Managed Care - PPO | Attending: Internal Medicine | Admitting: Internal Medicine

## 2023-03-20 DIAGNOSIS — S93402A Sprain of unspecified ligament of left ankle, initial encounter: Secondary | ICD-10-CM | POA: Diagnosis not present

## 2023-03-20 DIAGNOSIS — M25572 Pain in left ankle and joints of left foot: Secondary | ICD-10-CM | POA: Diagnosis not present
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
## 2023-03-20 NOTE — ED Triage Notes (Signed)
Pt states she rolled her left ankle and heard a crack 6 days ago and has still been having pain when walking and numbness after walking and elevating it. Pt would like a x-ray. Taking tylenol and Advil for pain.

## 2023-03-20 NOTE — Discharge Instructions (Signed)
Your x-ray was negative for fracture.  Please continue treatment of your ankle sprain with over-the-counter ibuprofen or Tylenol as needed.  Elevate and ice as needed.  You may use an Ace wrap for swelling and stabilization of the ankle if needed.  Please follow-up with your PCP if your symptoms do not improve.  Please go to emergency room for any worsening symptoms.  I hope you feel better soon!

## 2023-03-23 ENCOUNTER — Ambulatory Visit (INDEPENDENT_AMBULATORY_CARE_PROVIDER_SITE_OTHER): Payer: Commercial Managed Care - PPO | Admitting: Physician Assistant

## 2023-03-23 ENCOUNTER — Encounter: Payer: Self-pay | Admitting: Physician Assistant

## 2023-03-23 VITALS — BP 104/70 | HR 74 | Temp 97.8°F | Ht 64.5 in | Wt 140.0 lb

## 2023-03-23 DIAGNOSIS — R200 Anesthesia of skin: Secondary | ICD-10-CM | POA: Diagnosis not present

## 2023-03-23 DIAGNOSIS — M79609 Pain in unspecified limb: Secondary | ICD-10-CM

## 2023-03-23 DIAGNOSIS — Z0001 Encounter for general adult medical examination with abnormal findings: Secondary | ICD-10-CM

## 2023-03-23 DIAGNOSIS — Z0000 Encounter for general adult medical examination without abnormal findings: Secondary | ICD-10-CM

## 2023-03-23 DIAGNOSIS — E010 Iodine-deficiency related diffuse (endemic) goiter: Secondary | ICD-10-CM

## 2023-03-23 DIAGNOSIS — S93402D Sprain of unspecified ligament of left ankle, subsequent encounter: Secondary | ICD-10-CM

## 2023-03-23 DIAGNOSIS — R229 Localized swelling, mass and lump, unspecified: Secondary | ICD-10-CM

## 2023-03-23 DIAGNOSIS — R202 Paresthesia of skin: Secondary | ICD-10-CM

## 2023-03-23 NOTE — Patient Instructions (Signed)
It was great to see you! ? ?Please go to the lab for blood work.  ? ?Our office will call you with your results unless you have chosen to receive results via MyChart. ? ?If your blood work is normal we will follow-up each year for physicals and as scheduled for chronic medical problems. ? ?If anything is abnormal we will treat accordingly and get you in for a follow-up. ? ?Take care, ? ?Allyn Bartelson ?  ? ? ?

## 2023-03-28 ENCOUNTER — Ambulatory Visit: Payer: Commercial Managed Care - PPO | Admitting: Neurology

## 2023-06-20 ENCOUNTER — Telehealth (HOSPITAL_BASED_OUTPATIENT_CLINIC_OR_DEPARTMENT_OTHER): Payer: Commercial Managed Care - HMO | Admitting: Neurology

## 2023-06-21 ENCOUNTER — Telehealth (INDEPENDENT_AMBULATORY_CARE_PROVIDER_SITE_OTHER): Payer: Commercial Managed Care - HMO | Admitting: Neurology

## 2023-06-21 ENCOUNTER — Encounter (HOSPITAL_BASED_OUTPATIENT_CLINIC_OR_DEPARTMENT_OTHER): Payer: Self-pay | Admitting: Neurology

## 2023-06-21 ENCOUNTER — Telehealth (HOSPITAL_BASED_OUTPATIENT_CLINIC_OR_DEPARTMENT_OTHER): Payer: Self-pay

## 2023-06-21 VITALS — Ht 65.0 in | Wt 132.0 lb

## 2023-06-21 DIAGNOSIS — M792 Neuralgia and neuritis, unspecified: Secondary | ICD-10-CM

## 2023-06-21 DIAGNOSIS — R208 Other disturbances of skin sensation: Secondary | ICD-10-CM

## 2023-06-21 DIAGNOSIS — R270 Ataxia, unspecified: Secondary | ICD-10-CM

## 2023-06-21 DIAGNOSIS — G47411 Narcolepsy with cataplexy: Secondary | ICD-10-CM

## 2023-06-21 DIAGNOSIS — G4719 Other hypersomnia: Secondary | ICD-10-CM

## 2023-06-21 DIAGNOSIS — G43009 Migraine without aura, not intractable, without status migrainosus: Secondary | ICD-10-CM

## 2023-06-21 DIAGNOSIS — R5382 Chronic fatigue, unspecified: Secondary | ICD-10-CM

## 2023-06-21 MED ORDER — QULIPTA 60 MG PO TABS
60.0000 mg | ORAL_TABLET | Freq: Every morning | ORAL | 5 refills | Status: DC
Start: 2023-06-21 — End: 2023-09-06

## 2023-06-21 MED ORDER — MODAFINIL 200 MG PO TABS
200.0000 mg | ORAL_TABLET | Freq: Every morning | ORAL | 5 refills | Status: DC
Start: 2023-06-21 — End: 2024-01-02

## 2023-06-21 NOTE — Telephone Encounter (Signed)
Qulipta 60mg  PA submitted with questions answered and notes attached CMM Key [id number] pending determination

## 2023-06-21 NOTE — Progress Notes (Signed)
[facility] Neurology Telemedicine Follow up Visit    Date of visit: June 21, 2023    Verbal Consent      Verbal consent has been obtained from the patient to conduct a Video Visit encounter     Patient ID:  Patient's Name: Alexis Gentry  Date of Birth: 06/08/1996  MRN: [id number]    Subjective:      HPI    The patient reports that she continues to have weakness and stiffness in both legs and hands.    She continues to fall down after having intense emotions such as getting surprised.  She continues to have headaches almost every day.  The headaches tend to be associated with sensitivity to light, sound, and nausea.  She reports that Nurtec ODT is no longer working for her.    She has been taking pregabalin 150 mg twice a day and amitriptyline 50 mg at bedtime for migraine prophylaxis.  She also takes modafinil 150 mg once in the morning, which has improved the daytime sleepiness.  She reports no new side effects.    She did not have a chance to be evaluated at [facility] in July since she came down with COVID-19 infection.  The following portions of the patient's history were reviewed and updated as appropriate: allergies, current medications, past family history, past medical history, past social history, past surgical history and problem list.      Past Medical History:   Diagnosis Date    Pain     bilat lower ext    Weakness      Current Outpatient Medications on File Prior to Visit   Medication Sig Dispense Refill    amitriptyline (ELAVIL) 50 MG tablet Take 1 tablet (50 mg) by mouth nightly 30 tablet 5    levonorgestrel (MIRENA) 20 MCG/24HR IUD 1 Intra Uterine Device (52 mg) by Intrauterine route once Dec 2018      Multiple Vitamins-Minerals (MULTIVITAMIN WITH MINERALS) tablet Take 1 tablet by mouth daily      pregabalin (LYRICA) 150 MG capsule Take 1 capsule (150 mg) by mouth 2 (two) times daily 60 capsule 5    VITAMIN D PO Take 5,000 Units by mouth daily      diazePAM (VALIUM) 5 MG tablet  Take 1 tablet (5 mg total) by mouth every 12 (twelve) hours as needed (Muscle spasms and stiffness) (Patient not taking: Reported on 06/21/2023) 60 tablet 4    modafinil (PROVIGIL) 100 MG tablet Take 1.5 tablets (150 mg) by mouth every morning 45 tablet 4    naratriptan (Amerge) 2.5 MG tablet Take 1 tablet (2.5 mg) by mouth as needed for Migraine May repeat 3 hours later if needed (Patient not taking: Reported on 06/21/2023) 12 tablet 5    Nurtec 75 MG Tablet Dispersible Take 1 tablet (75 mg) by mouth every other day Take at the onset of migraine wait 24 hour to take it again if needed (Patient not taking: Reported on 06/21/2023) 16 tablet 5     No current facility-administered medications on file prior to visit.       Objective:     Neurological Examination:  Vitals:    06/21/23 0821   Weight: 59.9 kg (132 lb)   Height: 1.651 m (5\' 5" )   Body mass index is 21.97 kg/m.  Alexis Gentry is awake and alert.  She answers questions, and follows commands appropriately. Speech is clear. No skin rashes are noted.   There is no diplopia, nystagmus,  eyelid ptosis, facial weakness, tongue atrophy, fasciculations or tongue weakness.   There is no fixation on arm rotating test.  She is able to raise both arms above shoulder level symmetrically.  She has intentional tremors in both hands. She is able to open and close her fists symmetrically.      Data Reviewed:    Overnight polysomnogram from February 26, 2022:    Indication:  Excessive daytime hypersomnolence.     SUMMARY OF FINDINGS:  The total sleep time was 348.5 minutes, with a sleep efficiency of 88.5%.    The latency to sleep onset was 9.7 minutes with an REM latency of 67.0   minutes.  The patient achieved all stages of sleep during the study.  The   arousal index was 10.8 with an awakening index of 4.0.  Most arousals seen   were spontaneous.     The periodic limb movement index was 2.6.     The total apnea-hypopnea index using 4% oxygen desaturation criteria was   0.5, 4.6  using 3% criteria.  The respiratory disturbance index was 5.7, AHI  and RDI were slightly higher in non-supine positions.  The minimum oxygen   saturation during sleep was 94%, with an average oxygen saturation during   sleep of 97.2%.     Sleep architecture is noted to be relatively fragmented.  Heart rate ranged  from 70s to 90s, regular rhythm.     IMPRESSION:  1.  Mild/borderline upper airway resistance syndrome, without clear   evidence for more significant sleep disturbed breathing or sleep apnea.    Conservative measures include elevating the head of the bed or using a   wedge pillow, weight loss if appropriate for goal BMI less than 25.  2.  No evidence for significant periodic limb movements of sleep.  3.  First night effect, given the fragmented sleep architecture, not   uncommon in laboratory setting.  4.  Multiple sleep latency tests was performed in conjunction with the   study, results reported separately.     Thank you for referring this patient for evaluation.        D: 03/02/2022 09:58 PM by Eleanora Neighbor. Stacy Gardner, MD ([id number])  T: 03/03/2022 12:58 AM by DCM [id number]    MULTIPLE SLEEP LATENCY TEST     Date of study: 02/27/2022     Indication: Daytime hypersomnolence     Scoring Table     Naps Sleep Latency REM           1 3.6 minutes No   2 1.6 minutes Yes   3 2.3 minutes No   4 7.6 minutes No   5 3.6 minutes No           Average Sleep Latency: 3.8 minutes 1/5 REMs         IMPRESSION:   Abnormal study indicating severe hypersomnolence, with 1 sleep onset REM noted.  These findings are suggestive, but not diagnostic, of narcolepsy in the proper clinical setting.  Clinical correlation recommended.        Eric B. Stacy Gardner, MD  Director, [facility] Sleep Disorders Program  Assistant Professor, [place] [employer], [employer] Certified in Neurology, Sleep Medicine, Clinical Neurophysiology and Vascular Neurology        MSLT NORMALS: In normal patients, the MSLT sleep latency is longer than  15 minutes and only 0 to 1 REM periods occur during the test. A mean sleep latency during the MSLT between 10  and 15 minutes represents mild sleepiness; less than 10 minutes is considered to be abnormal; shorter than 5 minutes is considered to be severe (pathologic) sleepiness. Two or more REM periods in the five naps is characteristic of narcolepsy. However, it is important to note that REM may occur during naps in patients who are sleep deprived or withdrawing from REM depressing medications such as anti-depressants and stimulants.               Electronically signed by Campbell Lerner, MD at 03/02/2022 10:02 PM    Results for orders placed or performed during the hospital encounter of 10/12/19   MRI Brain W WO Contrast    Narrative    HISTORY: Weakness in both lower extremities. Progressive muscular  rigidity and spasm. Chronic fatigue. Correlate with MRI examination of  the cervical spine and thoracic spine March 02, 2019.    COMPARISON: No previous brain imaging is available. MRI examinations of  the cervical spine and thoracic spine dated March 02, 2019 are available.    TECHNIQUE: Non-contrast and contrast enhanced imaging was obtained.  The  patient received  6 mL of   Gadavist contrast material via IV  administration.    FINDINGS: The brain volume is normal. No mass, hemorrhage, or  hydrocephalus is detected.  The volume and the signal of the white  matter is normal in both cerebral hemispheres and in both cerebellar  hemispheres.  The basilar cisterns are clear. The midline structures of  the brain appear intact. The foramen magnum is patent.  There is no  evidence of a Chiari malformation.    The diffusion weighted images are normal.  There is no evidence of acute  or recent infarction.  There are appropriate flow-voids in the major  arterial and venous structures.     After the administration of contrast material, there is normal pattern  of enhancement of the brain parenchyma and meningeal surfaces.    There  is a mild generalized thickening of the calvarium.    The paranasal sinuses are aerated.  There is a small amount of mucosal  thickening in the ethmoid and maxillary sinus bilaterally.  No fluid is  seen in the paranasal sinuses.  The nasopharynx has a normal  configuration.      Impression    1. No intracranial mass, hemorrhage, or hydrocephalus is detected.  2. The volume and the signal of the white matter is normal in both  cerebral hemispheres and in both cerebellar hemispheres.  3. There is normal pattern of enhancement of the brain parenchyma and  meningeal surfaces.  4. There is a mild generalized thickening of the calvarium.    Theodoro Doing, MD   10/12/2019 2:41 PM       Laboratory:    Component Value Date    NA 135 (L) 09/22/2018    K 3.8 09/22/2018    CO2 22 09/22/2018    AST 15 09/21/2018    ALT 17 09/21/2018    B12 706 09/22/2018    VITB6 11 10/12/2018    TSH 0.58 09/22/2018    WBC 4.91 09/22/2018    HGB 11.5 09/22/2018    HCT 35.5 09/22/2018    PLT 316 09/22/2018       Assessment and Plan:      Diagnosis ICD-10-CM Associated Order   1. Migraine without aura and without status migrainosus, not intractable  G43.009 Qulipta 60 MG Tab      2. Daytime hypersomnolence  G47.19  modafinil (PROVIGIL) 200 MG tablet      3. 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 Ataxia  R27.0       6. Chronic fatigue  R53.82 modafinil (PROVIGIL) 200 MG tablet      7. 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  She continues to have muscle rigidity and multiple falls, which potentially could be triggered by intense emotions or getting surprised.  She had an overnight polysomnogram, followed by a Multiple Sleep Latency Test (MSLT), which revealed severe hypersomnolence with one sleep onset REM, which is suggestive, but not diagnostic of narcolepsy.  She is going to have further comprehensive evaluation at [facility] on August 17, 2023.    I advised her to increase modafinil to 200 mg once in the morning to optimize her treatment.  I reviewed potential side effects, including jitteriness, increased blood pressure, and exacerbation of headaches.    I advised her to continue to take pregabalin 150 mg twice a day in combination with amitriptyline 50 mg at bedtime.  She has noted that her migraines are occurring almost daily.  She is no longer responding well to Nurtec ODT.  I advised her to try Qulipta 60 mg once a day for migraine prophylaxis.  I explained that Qulipta also blocks the CGRP receptors.    I provided new prescriptions for modafinil and Qulipta.    Alexis Gentry will follow up in our office in 3 months or earlier if needed.  She can contact the office should he have any further questions or concerns.    Sincerely,    Marinda Elk M.D    Board Certified, American Board of Psychiatry and Neurology  [facility]    Phone #: [phone]  Fax #: [phone] [place] [address])  [id number]  [place], [place]. [address]      This note was generated by the [facility] speech recognition program and may contain inherent errors or omissions not intended by the user. Grammatical errors, random word insertions, deletions, pronoun errors and incomplete sentences are occasional consequences of this technology due to software limitations. Not all errors are caught or corrected. If there are questions or concerns about the content of this note or information contained within the body of this dictation they should be addressed directly with the author for clarification.

## 2023-06-21 NOTE — Telephone Encounter (Signed)
Received request via MSOT and beginning benefits investigation for Qulipta 60g    Patient has Insurance [facility]    PA Required. Initiated via CMM (Key [id number]). Waiting for finalized appt notes.

## 2023-06-22 NOTE — Telephone Encounter (Signed)
Prior Auth Outcome for [facility] 60mg     Insurance [facility] Plan    PA Status APPROVED     Case ID [id number]    Effective Dates 06/21/2023-12/18/2023    Approved Qty/Day supply #30/30     Expected copay $150.00 - Eligible for savings card    Eligible to fill prescription at [facility]. Liaison will reach out to patient and offer [facility] Team services.
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
## 2023-07-20 ENCOUNTER — Encounter (HOSPITAL_BASED_OUTPATIENT_CLINIC_OR_DEPARTMENT_OTHER): Payer: Self-pay | Admitting: Neurology

## 2023-07-21 ENCOUNTER — Encounter (HOSPITAL_BASED_OUTPATIENT_CLINIC_OR_DEPARTMENT_OTHER): Payer: Self-pay

## 2023-07-21 ENCOUNTER — Telehealth (INDEPENDENT_AMBULATORY_CARE_PROVIDER_SITE_OTHER): Payer: Commercial Managed Care - PPO | Admitting: Physician Assistant

## 2023-07-21 ENCOUNTER — Encounter: Payer: Self-pay | Admitting: Physician Assistant

## 2023-07-21 VITALS — Ht 64.5 in | Wt 132.0 lb

## 2023-07-21 DIAGNOSIS — F419 Anxiety disorder, unspecified: Secondary | ICD-10-CM

## 2023-07-21 DIAGNOSIS — F321 Major depressive disorder, single episode, moderate: Secondary | ICD-10-CM

## 2023-07-21 DIAGNOSIS — Z8669 Personal history of other diseases of the nervous system and sense organs: Secondary | ICD-10-CM

## 2023-07-21 MED ORDER — AMITRIPTYLINE HCL 50 MG PO TABS: 50.0000 mg | ORAL_TABLET | Freq: Every day | ORAL | 1 refills | Status: DC

## 2023-07-21 MED ORDER — QULIPTA 60 MG PO TABS: 1.0000 | ORAL_TABLET | Freq: Every day | ORAL | 0 refills | Status: AC

## 2023-07-21 MED ORDER — BUSPIRONE HCL 5 MG PO TABS: 5.0000 mg | ORAL_TABLET | Freq: Two times a day (BID) | ORAL | 1 refills | Status: DC | PRN

## 2023-07-21 NOTE — Progress Notes (Signed)
I acted as a Neurosurgeon for [employer], PA-C Corky Mull, LPN  Virtual Visit via Video Note   I, [name], PA, connected with  Nicole Evans  ([id number], [birth year]) on 07/21/23 at  8:40 AM EST by a video-enabled telemedicine application and verified that I am speaking with the correct person using two identifiers.  Location: Patient: Home Provider: [place] office   I discussed the limitations of evaluation and management by telemedicine and the availability of in person appointments. The patient expressed understanding and agreed to proceed.    History of Present Illness: Nicole Evans is a 27 y.o. who identifies as a female who was assigned female at birth, and is being seen today for Migraines and anxiety.   Migraine Pt c/o migraines worsening, having them 5 days out of the week, lasting few hours. Has tried Amitriptyline, Ibuprofen, Tylenol and Advil with very little relief. Her neurologist prescribed quilipta but insurance has not approved it. She is not sure if prior authorization has been done. Having worsening headache(s) due to stress -- see below.  Anxiety Pt c/o increase in anxiety since incident at work last week.  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 She is anxious, having crying spells and panic attacks, trouble doing tasks with out a breakdown. She is wondering if taking time off vs intermittent Family Medical Leave Act [facility]) would be helpful Denies suicidal ideation/shi Currently on amitriptyline for headache(s)   Problems:  Patient Active Problem List   Diagnosis Date Noted   Paresthesia and pain of extremity 09/04/2018   Numbness and tingling of upper and lower extremities of both sides 09/01/2018   Leukopenia 09/01/2018   Hyponatremia 09/01/2018   Vitamin D deficiency 09/01/2018   History of migraine headaches 08/17/2018   Family history of fibromyalgia  08/17/2018   Chronic pain of multiple joints 08/17/2018   Family history of systemic lupus erythematosus 08/17/2018   Symptoms of upper respiratory infection (URI) 07/04/2017   Pregnancy 06/09/2017   Anemia during pregnancy in third trimester 04/29/2017   Thyromegaly 04/07/2017   Abnormal thyroid function test 03/13/2017   Vacuum extractor delivery, delivered 12/10/2015   Indication for care in labor or delivery 12/09/2015   History of vacuum extraction assisted delivery 09/14/2015    Allergies:  Allergies  Allergen Reactions   Penicillins Anaphylaxis, Rash and Other (See Comments)    Has patient had a PCN reaction causing immediate rash, facial/tongue/throat swelling, SOB or lightheadedness with hypotension: Yes Has patient had a PCN reaction causing severe rash involving mucus membranes or skin necrosis: No Has patient had a PCN reaction that required hospitalization No Has patient had a PCN reaction occurring within the last 10 years: No If all of the above answers are "NO", then may proceed with Cephalosporin use.   Medications:  Current Outpatient Medications:    amitriptyline (ELAVIL) 50 MG tablet, Take 1 tablet (50 mg total) by mouth at bedtime., Disp: 90 tablet, Rfl: 1   Atogepant (QULIPTA) 60 MG TABS, Take 1 tablet (60 mg total) by mouth daily in the afternoon., Disp: 12 tablet, Rfl: 0   busPIRone (BUSPAR) 5 MG tablet, Take 1 tablet (5 mg total) by mouth 2 (two) times daily as needed., Disp: 30 tablet, Rfl: 1   cholecalciferol (VITAMIN D3) 25 MCG (1000 UT) tablet, Take 1,000 Units by mouth daily., Disp: , Rfl:    cyanocobalamin (VITAMIN B12) 1000 MCG tablet, Take 1,000  mcg by mouth daily., Disp: , Rfl:    ferrous sulfate 325 (65 FE) MG EC tablet, Take 325 mg by mouth. Four times a week, Disp: , Rfl:    levonorgestrel (MIRENA, 52 MG,) 20 MCG/DAY IUD, Take by intrauterine route., Disp: , Rfl:    Multiple Vitamin (MULTIVITAMIN) tablet, Take 1 tablet by mouth daily., Disp: , Rfl:     pregabalin (LYRICA) 150 MG capsule, Take 150 mg by mouth as needed., Disp: , Rfl:   Observations/Objective: Patient is well-developed, well-nourished in no acute distress.  Resting comfortably at home.  Head is normocephalic, atraumatic.  No labored breathing.  Speech is clear and coherent with logical content.  Patient is alert and oriented at baseline.  Tearful  Assessment and Plan: Anxiety; Depression, major, single episode, moderate (HCC) - Ambulatory referral to Psychology Uncontrolled Declines suicidal ideation/hi Increase elavil to 50 mg daily add buspar 5 mg twice daily as needed Call EAP Referral to psychology Call human resources to discuss Family Medical Leave Act [facility]) vs short term disability and send paperwork to [place] Follow-up in 2 weeks, sooner if concerns I discussed with patient that if they develop any SI, to tell someone immediately and seek medical attention.  History of migraine headaches Uncontrolled but no red flags Worsened due to mental health Quilipta samples left for her to pickup Continue to follow-up closely with neurology We may end up offering to do prior authorization for quilipta   Follow Up Instructions: I discussed the assessment and treatment plan with the patient. The patient was provided an opportunity to ask questions and all were answered. The patient agreed with the plan and demonstrated an understanding of the instructions.  A copy of instructions were sent to the patient via MyChart unless otherwise noted below.   The patient was advised to call back or seek an in-person evaluation if the symptoms worsen or if the condition fails to improve as anticipated.  I spent a total of 32 minutes on this visit, today 07/21/23, which included reviewing previous notes from neuro, discussing plan of care with patient and using shared-decision making on next steps, refilling medications, and documenting the findings in the note.    [name], [place]

## 2023-07-22 ENCOUNTER — Encounter (HOSPITAL_BASED_OUTPATIENT_CLINIC_OR_DEPARTMENT_OTHER): Payer: Self-pay

## 2023-07-29 ENCOUNTER — Encounter: Payer: Self-pay | Admitting: Physician Assistant

## 2023-07-29 NOTE — Telephone Encounter (Signed)
Please see patient message and advise. Not seen in office since 03/2023; pt needs appt to discuss FMLA paperwork?

## 2023-08-01 ENCOUNTER — Telehealth: Payer: Self-pay | Admitting: Physician Assistant

## 2023-08-01 NOTE — Telephone Encounter (Signed)
FMLA paperwork placed in your folder to complete.

## 2023-08-01 NOTE — Telephone Encounter (Signed)
Document  Disability , to be filled out by provider. Patient requested to send it back via Fax within 5-days. Document is located in providers tray at front office.Please advise

## 2023-08-03 NOTE — Telephone Encounter (Signed)
The [employer] forms completed and faxed to [phone]. Pt notified thru My Chart message.

## 2023-08-15 ENCOUNTER — Encounter: Payer: Self-pay | Admitting: Physician Assistant

## 2023-08-15 ENCOUNTER — Telehealth (INDEPENDENT_AMBULATORY_CARE_PROVIDER_SITE_OTHER): Payer: Commercial Managed Care - PPO | Admitting: Physician Assistant

## 2023-08-15 VITALS — Ht 64.5 in | Wt 130.0 lb

## 2023-08-15 DIAGNOSIS — F321 Major depressive disorder, single episode, moderate: Secondary | ICD-10-CM | POA: Diagnosis not present

## 2023-08-15 DIAGNOSIS — F419 Anxiety disorder, unspecified: Secondary | ICD-10-CM | POA: Diagnosis not present

## 2023-08-15 DIAGNOSIS — F4310 Post-traumatic stress disorder, unspecified: Secondary | ICD-10-CM | POA: Diagnosis not present

## 2023-08-15 MED ORDER — BUSPIRONE HCL 10 MG PO TABS: 10.0000 mg | ORAL_TABLET | Freq: Two times a day (BID) | ORAL | 1 refills | Status: DC

## 2023-08-15 NOTE — Progress Notes (Signed)
I acted as a Neurosurgeon for [employer], PA-C Corky Mull, LPN  Virtual Visit via Video Note   I, [name], Pa, connected with  Nicole Evans  ([id number], March 27, 1996) on 08/15/23 at 10:40 AM EST by a video-enabled telemedicine application and verified that I am speaking with the correct person using two identifiers.  Location: Patient: Home Provider: [place] office   I discussed the limitations of evaluation and management by telemedicine and the availability of in person appointments. The patient expressed understanding and agreed to proceed.    History of Present Illness: Nicole Evans is a 27 y.o. who identifies as a female who was assigned female at birth, and is being seen today for FMLA., anxiety/depression. Pt is f/u on her FMLA and additional paperwork to be completed.  She reports since she last saw me she has started having night terrors. She states that most of the time, her fiance wakes her up because she is screaming in her sleep. Over the past week, this has been at least 5 nights and has happened multiple times per night. We did increase her elavil from 25 to 50 mg at last visit but does not feel like this is a side effect(s) from this medication.  She also has a new symptom where she is either laying down or sitting and she has these episodes where she can hear/see what's going on but is unable to move her body or talk. Has happened about 8 times since she last saw me. Lasts 5-10 minutes up to 30 minutes.  She does have new patient appointment with [facility] next week to review her ongoing neurological issues including, but not limited to, migraines, daytime fatigue, neuropathic pain, ataxia.  Has taken buspar 5 mg twice daily but does not feel like its super effective  She is having panic attacks at least daily Last 10-60 minutes Symptom(s) include: crying spells, hyperventilation, lack of concentration/focus Triggered by  newborns  She has had one counseling session with [facility] on 11/25 and has another today (12/2)  She is establishing with a psychologist with [facility] on this Thursday (12/5)  Denies suicidal ideation/hi   Problems:  Patient Active Problem List   Diagnosis Date Noted   Paresthesia and pain of extremity 09/04/2018   Numbness and tingling of upper and lower extremities of both sides 09/01/2018   Leukopenia 09/01/2018   Hyponatremia 09/01/2018   Vitamin D deficiency 09/01/2018   History of migraine headaches 08/17/2018   Family history of fibromyalgia 08/17/2018   Chronic pain of multiple joints 08/17/2018   Family history of systemic lupus erythematosus 08/17/2018   Symptoms of upper respiratory infection (URI) 07/04/2017   Pregnancy 06/09/2017   Anemia during pregnancy in third trimester 04/29/2017   Thyromegaly 04/07/2017   Abnormal thyroid function test 03/13/2017   Vacuum extractor delivery, delivered 12/10/2015   Indication for care in labor or delivery 12/09/2015   History of vacuum extraction assisted delivery 09/14/2015    Allergies:  Allergies  Allergen Reactions   Penicillins Anaphylaxis, Rash and Other (See Comments)    Has patient had a PCN reaction causing immediate rash, facial/tongue/throat swelling, SOB or lightheadedness with hypotension: Yes Has patient had a PCN reaction causing severe rash involving mucus membranes or skin necrosis: No Has patient had a PCN reaction that required hospitalization No Has patient had a PCN reaction occurring within the last 10 years: No If all of the above answers are "NO", then may proceed with  Cephalosporin use.   Medications:  Current Outpatient Medications:    amitriptyline (ELAVIL) 50 MG tablet, Take 1 tablet (50 mg total) by mouth at bedtime., Disp: 90 tablet, Rfl: 1   Atogepant (QULIPTA) 60 MG TABS, Take 1 tablet (60 mg total) by mouth daily in the afternoon., Disp: 12 tablet, Rfl: 0   busPIRone (BUSPAR) 5 MG tablet,  Take 1 tablet (5 mg total) by mouth 2 (two) times daily as needed., Disp: 30 tablet, Rfl: 1   cholecalciferol (VITAMIN D3) 25 MCG (1000 UT) tablet, Take 1,000 Units by mouth daily., Disp: , Rfl:    cyanocobalamin (VITAMIN B12) 1000 MCG tablet, Take 1,000 mcg by mouth daily., Disp: , Rfl:    ferrous sulfate 325 (65 FE) MG EC tablet, Take 325 mg by mouth. Four times a week, Disp: , Rfl:    levonorgestrel (MIRENA, 52 MG,) 20 MCG/DAY IUD, Take by intrauterine route., Disp: , Rfl:    Multiple Vitamin (MULTIVITAMIN) tablet, Take 1 tablet by mouth daily., Disp: , Rfl:    pregabalin (LYRICA) 150 MG capsule, Take 150 mg by mouth as needed., Disp: , Rfl:   Observations/Objective: Patient is well-developed, well-nourished in no acute distress.  Flat affect, tearful at times Resting comfortably  at home.  Head is normocephalic, atraumatic.  No labored breathing.  Speech is clear and coherent with logical content.  Patient is alert and oriented at baseline.   Assessment and Plan: Anxiety; Depression, major, single episode, moderate (HCC); PTSD (post-traumatic stress disorder) Ongoing Continue talk therapy with [facility] Establishes with talk therapist this Thursday Increase Bupsar to 10 mg twice daily -- may take as needed or with panic attacks Consider psychiatry referral Possibly now having some dissociation from her PTSD? She will follow-up on this with her neurologist and also therapist Continue to monitor all symptom(s)  Follow-up with me in 2-3 weeks, sooner if concerns I discussed with patient that if they develop any SI, to tell someone immediately and seek medical attention.    Follow Up Instructions: I discussed the assessment and treatment plan with the patient. The patient was provided an opportunity to ask questions and all were answered. The patient agreed with the plan and demonstrated an understanding of the instructions.  A copy of instructions were sent to the patient via MyChart  unless otherwise noted below.   The patient was advised to call back or seek an in-person evaluation if the symptoms worsen or if the condition fails to improve as anticipated.  I spent a total of 75 minutes on this visit, today 08/15/23, which included reviewing previous notes from our office, reviewing past and current paperwork, completing paperwork, ordering tests, discussing plan of care with patient and using shared-decision making on next steps, refilling medications, and documenting the findings in the note.   [name], [place]

## 2023-08-17 ENCOUNTER — Telehealth: Payer: Commercial Managed Care - PPO | Admitting: Physician Assistant

## 2023-08-18 ENCOUNTER — Ambulatory Visit: Payer: Commercial Managed Care - PPO | Admitting: Licensed Clinical Social Worker

## 2023-08-18 DIAGNOSIS — F419 Anxiety disorder, unspecified: Secondary | ICD-10-CM | POA: Diagnosis not present
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
## 2023-08-22 ENCOUNTER — Encounter (HOSPITAL_BASED_OUTPATIENT_CLINIC_OR_DEPARTMENT_OTHER): Payer: Self-pay | Admitting: Neurology

## 2023-08-23 DIAGNOSIS — G47419 Narcolepsy without cataplexy: Secondary | ICD-10-CM | POA: Diagnosis not present

## 2023-08-23 DIAGNOSIS — R29898 Other symptoms and signs involving the musculoskeletal system: Secondary | ICD-10-CM | POA: Diagnosis not present

## 2023-08-23 DIAGNOSIS — R200 Anesthesia of skin: Secondary | ICD-10-CM | POA: Diagnosis not present

## 2023-08-23 DIAGNOSIS — G47411 Narcolepsy with cataplexy: Secondary | ICD-10-CM | POA: Diagnosis not present

## 2023-08-23 DIAGNOSIS — R202 Paresthesia of skin: Secondary | ICD-10-CM | POA: Diagnosis not present

## 2023-08-23 DIAGNOSIS — G4711 Idiopathic hypersomnia with long sleep time: Secondary | ICD-10-CM | POA: Diagnosis not present

## 2023-08-24 ENCOUNTER — Encounter (INDEPENDENT_AMBULATORY_CARE_PROVIDER_SITE_OTHER): Payer: Self-pay

## 2023-08-24 DIAGNOSIS — Z30433 Encounter for removal and reinsertion of intrauterine contraceptive device: Secondary | ICD-10-CM | POA: Diagnosis not present

## 2023-08-29 ENCOUNTER — Ambulatory Visit: Payer: Commercial Managed Care - PPO | Admitting: Licensed Clinical Social Worker

## 2023-08-29 DIAGNOSIS — F418 Other specified anxiety disorders: Secondary | ICD-10-CM

## 2023-08-29 DIAGNOSIS — F329 Major depressive disorder, single episode, unspecified: Secondary | ICD-10-CM

## 2023-08-29 DIAGNOSIS — F419 Anxiety disorder, unspecified: Secondary | ICD-10-CM

## 2023-08-29 NOTE — Progress Notes (Addendum)
[facility] Counselor/Therapist Progress Note  Patient ID: Nicole Evans, MRN: [id number]   Date: 08/29/23  Time Spent: 10:04  am - 11:02 am : 58 Minutes:  Treatment Type: Individual Therapy.  Reported Symptoms: brain fog, tiredness and groggy  Mental Status Exam: Appearance:  Well Groomed     Behavior: Appropriate  Motor: Normal  Speech/Language:  Normal Rate  Affect: Appropriate  Mood: normal  Thought process: normal  Thought content:   WNL  Sensory/Perceptual disturbances:   WNL  Orientation: oriented to person, place, time  Attention: Good  Concentration: Good  Memory: WNL  Fund of knowledge:  Good  Insight:   Good  Judgment:  Good  Impulse Control: Good   Risk Assessment: Danger to Self:  No Self-injurious Behavior: No Danger to Others: No Duty to Warn:no Physical Aggression / Violence:No  Access to Firearms a concern: No  Gang Involvement:No   Subjective:   [name] participated from home, via video and consented to treatment. Therapist participated from home office. I discussed the limitations of evaluation and management by telemedicine and the availability of in person appointments. The patient expressed understanding and agreed to proceed. Aariya reviewed the events of the past week.     We reviewed numerous treatment approaches including CBT and Solution focused therapy. Psych-education regarding the Ceairra's diagnosis of No diagnosis found. was provided during the session. We discussed Tameria Givans Hartel's goals treatment goals which include self care wellness (sleep and diet) and focused job hunt for quality of life balance . [name] provided verbal approval of the treatment plan.   Interventions: Psycho-education & Goal Setting.   Diagnosis:  Anxiety and depression  Psychiatric Treatment: No , N/A   Treatment Plan:  Client Abilities/Strengths [place] family resources and assistance with  childcare  Support System: Family and fiance   Client Treatment Preferences Virtual sessions  Treatment Level Weekly  Symptoms  Insomnia/Tiredness   (Status: declined) Brain Fog   (Status: declined)  Goals:   Anquanette agreed to set goal of  filling my cup for wellness and self care and setting new work boundaries during the search for new employment.    Treatment plan signed and available on s-drive:  Yes    Target Date: 09/29/23 Frequency: Weekly  Progress: 0 Modality: individual    Therapist will provide referrals for additional resources as appropriate.  Therapist will provide psycho-education regarding Shaka's diagnosis and corresponding treatment approaches and interventions. Licensed Clinical Mental Health Counselor, Anselmo Pickler, [facility] will support the patient's ability to achieve the goals identified. will employ CBT, BA, Problem-solving, Solution Focused, Mindfulness,  coping skills, & other evidenced-based practices will be used to promote progress towards healthy functioning to help manage decrease symptoms associated with her diagnosis.   Reduce overall level, frequency, and intensity of the feelings of depression, anxiety and panic evidenced by decreased anxiety and depression symptoms.   Verbally express understanding of the relationship between feelings of restlessness/anxious/insomniac behaviors and the need to submit two resumes per day for the job search consistently and the impact on thinking patterns and behaviors focused on mindfulness.  Verbalize an understanding of the role that distorted thinking plays in creating fears, excessive worry, and ruminations.    [name] participated in the creation of the treatment plan)    Anselmo Pickler, [facility]

## 2023-08-30 ENCOUNTER — Other Ambulatory Visit (HOSPITAL_BASED_OUTPATIENT_CLINIC_OR_DEPARTMENT_OTHER): Payer: Self-pay | Admitting: Neurology

## 2023-08-30 ENCOUNTER — Telehealth (HOSPITAL_BASED_OUTPATIENT_CLINIC_OR_DEPARTMENT_OTHER): Payer: Self-pay | Admitting: Neurology

## 2023-08-30 ENCOUNTER — Encounter (HOSPITAL_BASED_OUTPATIENT_CLINIC_OR_DEPARTMENT_OTHER): Payer: Self-pay | Admitting: Neurology

## 2023-08-30 DIAGNOSIS — M792 Neuralgia and neuritis, unspecified: Secondary | ICD-10-CM

## 2023-08-30 MED ORDER — AMITRIPTYLINE HCL 75 MG PO TABS
75.0000 mg | ORAL_TABLET | Freq: Every evening | ORAL | 5 refills | Status: DC
Start: 2023-08-30 — End: 2024-05-23

## 2023-08-30 NOTE — Telephone Encounter (Signed)
[facility] [place] answering service called stating pt is experiencing burning sensation all over body. Pt seems to be in pain and is wondering about next steps

## 2023-09-05 ENCOUNTER — Telehealth: Payer: Commercial Managed Care - PPO | Admitting: Physician Assistant

## 2023-09-05 ENCOUNTER — Ambulatory Visit: Payer: Commercial Managed Care - PPO | Admitting: Licensed Clinical Social Worker

## 2023-09-05 ENCOUNTER — Encounter: Payer: Self-pay | Admitting: Physician Assistant

## 2023-09-05 VITALS — Ht 64.5 in | Wt 128.0 lb

## 2023-09-05 DIAGNOSIS — F419 Anxiety disorder, unspecified: Secondary | ICD-10-CM

## 2023-09-05 DIAGNOSIS — F329 Major depressive disorder, single episode, unspecified: Secondary | ICD-10-CM

## 2023-09-05 DIAGNOSIS — F4310 Post-traumatic stress disorder, unspecified: Secondary | ICD-10-CM

## 2023-09-05 DIAGNOSIS — F321 Major depressive disorder, single episode, moderate: Secondary | ICD-10-CM | POA: Diagnosis not present

## 2023-09-05 NOTE — Progress Notes (Signed)
 I acted as a Neurosurgeon for [employer], PA-C  Corky Mull, LPN    Virtual Visit via Video Note     I, [name], PA, connected with  Alexis Gentry  ([id number], 04-27-1996) on 09/05/23 at  8:40 AM EST by a video-enabled telemedicine app

## 2023-09-05 NOTE — Progress Notes (Addendum)
[facility] Counselor/Therapist Progress Note  Patient ID: Nicole Evans, MRN: [id number]    Date: 09/05/23  Time Spent: 3:03  pm - 4:04 pm : 61 Minutes  Treatment Type: Individual Therapy.  Reported Symptoms: eating better, concerned about money, still having sleep disturbances  Mental Status Exam: Appearance:  Fairly Groomed     Behavior: Agitated  Motor: Normal  Speech/Language:  Normal Rate  Affect: Appropriate  Mood: normal  Thought process: normal  Thought content:   WNL  Sensory/Perceptual disturbances:   WNL  Orientation: oriented to person, place, and time/date  Attention: Good  Concentration: Good  Memory: WNL  Fund of knowledge:  Good  Insight:   Good  Judgment:  Good  Impulse Control: Good   Risk Assessment: Danger to Self:  No Self-injurious Behavior: No Danger to Others: No Duty to Warn:no Physical Aggression / Violence:No  Access to Firearms a concern: No  Gang Involvement:No   Subjective:   Nicole Evans participated from home, via video, and consented to treatment. I discussed the limitations of evaluation and management by telemedicine and the availability of in person appointments. The patient expressed understanding and agreed to proceed.  Therapist participated from home office.  Nicole Evans reviewed the events of the past week.    Interventions: Mindfulness Meditation and Psycho-education/Bibliotherapy  Diagnosis:  Anxiety/Reactive Depression  Psychiatric Treatment: No , N/A  Treatment Plan:  Client Abilities/Strengths Nicole Evans family support, resilient and actively interviewing and searching for work options to return to.  Support System: Partner, family   Client Treatment Preferences virtual  Client Statement of Needs Nicole Evans would like to continue to find holistic practices to aid in sleep and better diet.     Treatment Level Weekly  Symptoms  tired   (Status: maintained) anxious   (Status: improved)  making steps to reduce anxiety through job exploration   Goals:   Nicole Evans experiences symptoms of sleeping between 10pm-2am.  Will get tart cherry juice, magnesium glycinate, body wash, lotions, epsom salts, getting a deep freezer.     Target Date: 09/29/2023 Frequency: Weekly  Progress: 0 Modality: individual    Therapist will provide referrals for additional resources as appropriate.  Therapist will provide psycho-education regarding Nicole Evans's diagnosis and corresponding treatment approaches and interventions. Licensed Clinical Mental Health Counselor, Anselmo Pickler, [facility] will support the patient's ability to achieve the goals identified. will employ CBT, BA, Problem-solving, Solution Focused, Mindfulness,  coping skills, & other evidenced-based practices will be used to promote progress towards healthy functioning to help manage decrease symptoms associated with her diagnosis.   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   Verbally express understanding of the relationship between feelings of depression, anxiety and their impact on thinking patterns and behaviors. Verbalize an understanding of the role that distorted thinking plays in creating fears, excessive worry, and ruminations.  Nicole Evans participated in the creation of the treatment plan)    Anselmo Pickler, [facility]

## 2023-09-05 NOTE — Progress Notes (Signed)
I acted as a Neurosurgeon for [employer], PA-C Corky Mull, LPN  Virtual Visit via Video Note   I, [name], PA, connected with  Nicole Evans  ([id number], 27-Jan-1996) on 09/05/23 at  8:40 AM EST by a video-enabled telemedicine application and verified that I am speaking with the correct person using two identifiers.  Location: Patient: Home Provider: [facility] [place] office   I discussed the limitations of evaluation and management by telemedicine and the availability of in person appointments. The patient expressed understanding and agreed to proceed.    History of Present Illness: Nicole Evans is a 27 y.o. who identifies as a female who was assigned female at birth, and is being seen today for anxiety and depression.   Seeing someone from [facility] for counseling  Also seeing Anselmo Pickler from [employer] -- working on mindfulness -- she is not getting full benefit from this and was told she may need to see counselor who specializes in grief  She is currently experiencing night terrors, memory loss, severe anxiety, fogginess  Taking buspar 10 mg twice daily with mild relief of symptom(s)   Her neurologist has increased her Elavil to 75 mg -- we are also hoping that this provides therapeutic benefit to her mood as well  Goal is to return to work on Jan 6 -- she does not feel ready for this  Denies suicidal ideation/hi   Problems:  Patient Active Problem List   Diagnosis Date Noted   Paresthesia and pain of extremity 09/04/2018   Numbness and tingling of upper and lower extremities of both sides 09/01/2018   Leukopenia 09/01/2018   Hyponatremia 09/01/2018   Vitamin D deficiency 09/01/2018   History of migraine headaches 08/17/2018   Family history of fibromyalgia 08/17/2018   Chronic pain of multiple joints 08/17/2018   Family history of systemic lupus erythematosus 08/17/2018   Symptoms of upper respiratory infection (URI)  07/04/2017   Pregnancy 06/09/2017   Anemia during pregnancy in third trimester 04/29/2017   Thyromegaly 04/07/2017   Abnormal thyroid function test 03/13/2017   Vacuum extractor delivery, delivered 12/10/2015   Indication for care in labor or delivery 12/09/2015   History of vacuum extraction assisted delivery 09/14/2015    Allergies:  Allergies  Allergen Reactions   Penicillins Anaphylaxis, Rash and Other (See Comments)    Has patient had a PCN reaction causing immediate rash, facial/tongue/throat swelling, SOB or lightheadedness with hypotension: Yes Has patient had a PCN reaction causing severe rash involving mucus membranes or skin necrosis: No Has patient had a PCN reaction that required hospitalization No Has patient had a PCN reaction occurring within the last 10 years: No If all of the above answers are "NO", then may proceed with Cephalosporin use.   Medications:  Current Outpatient Medications:    amitriptyline (ELAVIL) 75 MG tablet, Take 75 mg by mouth at bedtime., Disp: , Rfl:    Atogepant (QULIPTA) 60 MG TABS, Take 1 tablet (60 mg total) by mouth daily in the afternoon., Disp: 12 tablet, Rfl: 0   busPIRone (BUSPAR) 10 MG tablet, Take 1 tablet (10 mg total) by mouth 2 (two) times daily., Disp: 60 tablet, Rfl: 1   cholecalciferol (VITAMIN D3) 25 MCG (1000 UT) tablet, Take 1,000 Units by mouth daily., Disp: , Rfl:    cyanocobalamin (VITAMIN B12) 1000 MCG tablet, Take 1,000 mcg by mouth daily., Disp: , Rfl:    diazepam (VALIUM) 5 MG tablet, Take 5 mg by mouth every  12 (twelve) hours as needed., Disp: , Rfl:    levonorgestrel (MIRENA, 52 MG,) 20 MCG/DAY IUD, Take by intrauterine route., Disp: , Rfl:    modafinil (PROVIGIL) 200 MG tablet, Take 200 mg by mouth daily., Disp: , Rfl:    Multiple Vitamin (MULTIVITAMIN) tablet, Take 1 tablet by mouth daily., Disp: , Rfl:    pregabalin (LYRICA) 150 MG capsule, Take 150 mg by mouth daily., Disp: , Rfl:    Observations/Objective: Patient is well-developed, well-nourished in no acute distress.  Resting comfortably at home.  Head is normocephalic, atraumatic.  No labored breathing.  Speech is clear and coherent with logical content.  Patient is alert and oriented at baseline.    Assessment and Plan: Anxiety (Primary); Depression, major, single episode, moderate (HCC); PTSD (post-traumatic stress disorder) Ongoing Continue buspar 10 mg twice daily as needed Continue talk therapy -- consider starting to find a specialist in grief counseling Will extend short term disability - she will message me with what date she decides on  Denies suicidal ideation/hi Follow-up in 1 month, sooner if concerns  Follow Up Instructions: I discussed the assessment and treatment plan with the patient. The patient was provided an opportunity to ask questions and all were answered. The patient agreed with the plan and demonstrated an understanding of the instructions.  A copy of instructions were sent to the patient via MyChart unless otherwise noted below.   The patient was advised to call back or seek an in-person evaluation if the symptoms worsen or if the condition fails to improve as anticipated.  I spent a total of 30 minutes on this visit, today 09/05/23, which included reviewing previous notes from neurology, discussing plan of care with patient and using shared-decision making on next steps, refilling medications, and documenting the findings in the note.   [name], [place]

## 2023-09-06 ENCOUNTER — Telehealth (HOSPITAL_BASED_OUTPATIENT_CLINIC_OR_DEPARTMENT_OTHER): Payer: Self-pay

## 2023-09-06 ENCOUNTER — Telehealth (INDEPENDENT_AMBULATORY_CARE_PROVIDER_SITE_OTHER): Payer: Commercial Managed Care - HMO | Admitting: Neurology

## 2023-09-06 ENCOUNTER — Encounter (HOSPITAL_BASED_OUTPATIENT_CLINIC_OR_DEPARTMENT_OTHER): Payer: Self-pay | Admitting: Neurology

## 2023-09-06 VITALS — Ht 65.0 in | Wt 130.0 lb

## 2023-09-06 DIAGNOSIS — G47411 Narcolepsy with cataplexy: Secondary | ICD-10-CM

## 2023-09-06 DIAGNOSIS — R208 Other disturbances of skin sensation: Secondary | ICD-10-CM

## 2023-09-06 DIAGNOSIS — G43009 Migraine without aura, not intractable, without status migrainosus: Secondary | ICD-10-CM

## 2023-09-06 DIAGNOSIS — R296 Repeated falls: Secondary | ICD-10-CM

## 2023-09-06 DIAGNOSIS — G4719 Other hypersomnia: Secondary | ICD-10-CM

## 2023-09-06 DIAGNOSIS — R270 Ataxia, unspecified: Secondary | ICD-10-CM

## 2023-09-06 DIAGNOSIS — M792 Neuralgia and neuritis, unspecified: Secondary | ICD-10-CM

## 2023-09-06 MED ORDER — QULIPTA 60 MG PO TABS
60.0000 mg | ORAL_TABLET | Freq: Every morning | ORAL | 5 refills | Status: DC
Start: 2023-09-06 — End: 2024-05-23

## 2023-09-06 MED ORDER — PREGABALIN 150 MG PO CAPS
150.0000 mg | ORAL_CAPSULE | Freq: Two times a day (BID) | ORAL | 5 refills | Status: DC
Start: 2023-09-06 — End: 2024-05-23

## 2023-09-06 NOTE — Progress Notes (Signed)
 [facility] Neurology Telemedicine Follow up Visit    Date of visit: September 06, 2023    Verbal Consent      Verbal consent has been obtained from the patient to conduct a Video Visit encounter     Patient ID:  Patient's Name: Alexis Gentry  Date of

## 2023-09-06 NOTE — Telephone Encounter (Signed)
 Left message to schedule a Skin Biopsy with Dr. Hamilton Capri.

## 2023-09-08 ENCOUNTER — Encounter: Payer: Self-pay | Admitting: Physician Assistant

## 2023-09-13 ENCOUNTER — Telehealth: Payer: Self-pay | Admitting: Physician Assistant

## 2023-09-13 NOTE — Telephone Encounter (Signed)
 [facility] faxed  Attending physician's statement , to be filled out by provider. Patient requested to send it back via Fax within 5-days. Document is located in providers tray at front office.Please advise at Mobile [phone] (mobile)

## 2023-09-13 NOTE — Telephone Encounter (Signed)
 Forms completed and faxed to [facility]. My Chart message was sent to patient.

## 2023-09-15 ENCOUNTER — Telehealth: Payer: Commercial Managed Care - PPO | Admitting: Physician Assistant

## 2023-09-15 ENCOUNTER — Ambulatory Visit: Payer: Commercial Managed Care - PPO | Admitting: Licensed Clinical Social Worker

## 2023-09-15 DIAGNOSIS — Z91199 Patient's noncompliance with other medical treatment and regimen due to unspecified reason: Secondary | ICD-10-CM

## 2023-09-15 DIAGNOSIS — F329 Major depressive disorder, single episode, unspecified: Secondary | ICD-10-CM | POA: Diagnosis not present

## 2023-09-15 DIAGNOSIS — F419 Anxiety disorder, unspecified: Secondary | ICD-10-CM

## 2023-09-15 NOTE — Progress Notes (Signed)
                Anselmo Pickler, [facility]

## 2023-09-15 NOTE — Progress Notes (Signed)
 [facility] Counselor/Therapist Progress Note  Patient ID: Nicole Evans, MRN: [id number]    Date: 09/15/23  Time Spent: 11:03  am - 12:04 pm : 61 Minutes  Treatment Type: Individual Therapy.  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   Mental Status Exam: Appearance:  Casual     Behavior: Appropriate and Care-Taking  Motor: Normal  Speech/Language:  Normal Rate  Affect: Appropriate  Mood: anxious, depressed, and irritable  Thought process: circumstantial  Thought content:   WNL  Sensory/Perceptual disturbances:   WNL  Orientation: oriented to person, place, and time/date  Attention: Good  Concentration: Good  Memory: WNL  Fund of knowledge:  Good  Insight:   Good  Judgment:  Good  Impulse Control: Good   Risk Assessment: Danger to Self:  No Self-injurious Behavior: No Danger to Others: No Duty to Warn:no Physical Aggression / Violence:No  Access to Firearms a concern: No  Gang Involvement:No   Subjective:   Nicole Evans participated from home, via video, and consented to treatment. I discussed the limitations of evaluation and management by telemedicine and the availability of in person appointments. The patient expressed understanding and agreed to proceed.  Therapist participated from home office.  Carl reviewed the events of the past week. Main concern is that she may be terminated and this will be on her record.  Has a few other job offers and deciding if she should resign to avoid having this on her record.       Interventions: Solution-Oriented/Positive Psychology and Insight-Oriented  Diagnosis:  Anxiety  Reactive depression  Psychiatric Treatment: No , None  Treatment Plan:  Client Abilities/Strengths Nicole Evans has taken the time to search and accept new job offers so she has some options should her job choose to fire her.     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  She is open to starting the new chapter.     Treatment Level Weekly  Symptoms  Anxious   (Status: maintained) Depressed   (Status: maintained)  Goals:   Nicole Evans experiences symptoms of anxiety and worry around what will be the best decision for her and her family.  She wants to trust her gut and intuition because she had feelings once she got this job that she did not attend to.     Target Date: 09/29/23 Frequency: Weekly  Progress: 0 Modality: individual    Therapist will provide referrals for additional resources as appropriate.  Therapist will provide psycho-education regarding Nicole Evans's diagnosis and corresponding treatment approaches and interventions. Licensed Clinical Mental Health Counselor, Tawni Nicole Evans, [facility] will support the patient's ability to achieve the goals identified. will employ CBT, BA, Problem-solving, Solution Focused, Mindfulness,  coping skills, & other evidenced-based practices will be used to promote progress towards healthy functioning to help manage decrease symptoms associated with her diagnosis.   Reduce overall level, frequency, and intensity of the feelings of depression, anxiety and panic evidenced by decreased stress and worry about finances and the fate of her current job.  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  Verbally express understanding of the relationship between feelings of depression, anxiety and  their impact on thinking patterns and behaviors. Verbalize an understanding of the role that distorted thinking plays in creating fears, excessive worry, and ruminations.  Nicole Evans participated in the creation of the treatment plan)   Grey Rakestraw, LCMHC

## 2023-09-15 NOTE — Progress Notes (Signed)
 Patient was scheduled to see me however appointment was deemed unnecessary as she was just seen last week.  She was not seen by me.  No charge.  Has scheduled follow-up on 10/11/23.  [name] PA-C

## 2023-09-19 ENCOUNTER — Telehealth: Payer: Self-pay | Admitting: Physician Assistant

## 2023-09-19 NOTE — Telephone Encounter (Signed)
 [employer] faxed document FMLA, to be filled out by provider. Patient requested to send it back via Fax within 5-days. Document is located in providers tray at front office.Please advise at Mobile [phone] (mobile)

## 2023-09-20 NOTE — Telephone Encounter (Signed)
 Pt called back told her information that was requested by [employer] [facility] was sent over. Pt verbalized understanding.

## 2023-09-20 NOTE — Telephone Encounter (Signed)
 Office notes from 11/7, 12/2 and 09/05/2023 faxed to [facility] at [phone].

## 2023-09-20 NOTE — Telephone Encounter (Signed)
 Left message on voicemail to call office. Please tell pt information was sent to [facility] as they requested.

## 2023-09-22 ENCOUNTER — Ambulatory Visit (INDEPENDENT_AMBULATORY_CARE_PROVIDER_SITE_OTHER): Payer: Commercial Managed Care - PPO | Admitting: Licensed Clinical Social Worker

## 2023-09-22 DIAGNOSIS — F419 Anxiety disorder, unspecified: Secondary | ICD-10-CM

## 2023-09-22 DIAGNOSIS — F329 Major depressive disorder, single episode, unspecified: Secondary | ICD-10-CM

## 2023-09-22 DIAGNOSIS — F418 Other specified anxiety disorders: Secondary | ICD-10-CM

## 2023-09-22 NOTE — Progress Notes (Signed)
 [facility] Counselor/Therapist Progress Note  Patient ID: Nicole Evans, MRN: [id number]    Date: 09/22/23  Time Spent: 11:22  am - 12:05 pm : 43 Minutes  Treatment Type: Individual Therapy.  Reported Symptoms: Has moved into a healthy IDC mode about the potential loss of work after this incident, future forward and optimistic thoughts  Mental Status Exam: Appearance:  Casual     Behavior: Appropriate and Sharing  Motor: Normal  Speech/Language:  Clear and Coherent  Affect: Appropriate, Blunt  Mood: normal  Thought process: goal directed  Thought content:   WNL  Sensory/Perceptual disturbances:   WNL  Orientation: oriented to person, place, and time/date  Attention: Good  Concentration: Good  Memory: WNL  Fund of knowledge:  Good  Insight:   Good  Judgment:  Good  Impulse Control: Good   Risk Assessment: Danger to Self:  No Self-injurious Behavior: No Danger to Others: No Duty to Warn:no Physical Aggression / Violence:No  Access to Firearms a concern: No  Gang Involvement:No   Subjective:   Nicole Evans participated from home, via video, and consented to treatment. I discussed the limitations of evaluation and management by telemedicine and the availability of in person appointments. The patient expressed understanding and agreed to proceed.  Therapist participated from office.  Nicole Evans reviewed the events of the past week.      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  She is open to meeting and feels targeted and unsupported in this position.     Treatment  Level Weekly  Symptoms  Frustrated   (Status: maintained) Anxious but future forward thoughts   (Status: maintained)  Goals:   Nicole Evans experiences symptoms of frustration and anxiety with this current work situation.  She has been making progress to process her feelings and awarness of her triggers in order to    Target Date: 09/29/23 Frequency: Weekly  Progress: 20 Modality: individual    Therapist will provide referrals for additional resources as appropriate.  Therapist will provide psycho-education regarding Nicole Evans's diagnosis and corresponding treatment approaches and interventions. Licensed Clinical Mental Health Counselor, Tawni Louder, [facility] will support the patient's ability to achieve the goals identified. will employ CBT, BA, Problem-solving, Solution Focused, Mindfulness,  coping skills, & other evidenced-based practices will be used to promote progress towards healthy functioning to help manage decrease symptoms associated with her diagnosis.   Reduce overall level, frequency, and intensity of the feelings of depression, anxiety and panic evidenced by increased awareness of emotions, increased ability to independently regulate emotions in order to reduce anxiety and depression symptom.   Verbally express understanding of the relationship between feelings of depression, anxiety and their impact on thinking patterns and behaviors. Verbalize an understanding of the role that distorted thinking plays in creating fears, excessive worry, and ruminations.  Nicole Evans participated in the creation of the treatment plan)    Shawne Bulow, LCMHC

## 2023-09-29 ENCOUNTER — Ambulatory Visit (INDEPENDENT_AMBULATORY_CARE_PROVIDER_SITE_OTHER): Payer: Commercial Managed Care - PPO | Admitting: Licensed Clinical Social Worker

## 2023-09-29 DIAGNOSIS — F418 Other specified anxiety disorders: Secondary | ICD-10-CM

## 2023-09-29 DIAGNOSIS — F419 Anxiety disorder, unspecified: Secondary | ICD-10-CM

## 2023-09-29 DIAGNOSIS — F329 Major depressive disorder, single episode, unspecified: Secondary | ICD-10-CM

## 2023-09-29 NOTE — Progress Notes (Signed)
[facility] Counselor/Therapist Progress Note  Patient ID: Nicole Evans, MRN: [id number]    Date: 09/29/23  Time Spent: 9:05  am - 10:04 am : 59 Minutes  Treatment Type: Individual Therapy.  Reported Symptoms: feeling nervous and anxious about new job prospects  Mental Status Exam: Appearance:  Disheveled     Behavior: Sharing and Agitated  Motor: Normal  Speech/Language:  Normal Rate  Affect: Appropriate  Mood: anxious and irritable  Thought process: circumstantial and flight of ideas  Thought content:   WNL  Sensory/Perceptual disturbances:   WNL  Orientation: oriented to person, place, and time/date  Attention: Good  Concentration: Good  Memory: WNL  Fund of knowledge:  Good  Insight:   Good  Judgment:  Good  Impulse Control: Good   Risk Assessment: Danger to Self:  No Self-injurious Behavior: No Danger to Others: No Duty to Warn:no Physical Aggression / Violence:No  Access to Firearms a concern: No  Gang Involvement:No   Subjective:   Nicole Evans participated from home, via video, and consented to treatment. I discussed the limitations of evaluation and management by telemedicine and the availability of in person appointments. The patient expressed understanding and agreed to proceed.  Therapist participated from home office.  Nicole Evans reviewed the events of the past week.  The focus will be on gaining growth in taking action for self, creating intention in the recent setback to set herself up for things that she needs in the current space of her life.      Interventions: Narrative  Diagnosis:  Anxiety/Depression  Psychiatric Treatment: Yes , None  Treatment Plan:  Client Abilities/Strengths Nicole Evans has been taking initiative looking for new work opportunities that will provide balance in her household and   Support System: Taking action in Psychologist, educational for jobs, partner, and family  Pharmacist, community of Needs Nicole Evans would like to focus on gaining control of things to be intentional about her job hunt and managing her self care.  New goal will be set next session.     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  Marked herself at 64% completion of the goal because she is doing well with the job hunt.  She was doing good with managing her sleep but the sleep schedule varies.     Target Date: 09/29/23 Frequency: Weekly  Progress: 64% Modality: individual    Therapist will provide referrals for additional resources as appropriate.  Therapist will provide psycho-education regarding Nicole Evans's diagnosis and corresponding treatment approaches and interventions. Licensed Clinical Mental Health Counselor, Anselmo Pickler, [facility] will support the patient's ability to achieve the goals identified. will employ CBT, BA, Problem-solving, Solution Focused, Mindfulness,  coping skills, & other evidenced-based practices will be used to promote progress towards healthy functioning to help manage decrease symptoms associated with her diagnosis.   Reduce overall level, frequency, and intensity of the feelings of depression, anxiety and panic evidenced by decreased level of frustration.   Verbally express understanding of the relationship between feelings of depression, anxiety and their impact on thinking patterns and behaviors. Verbalize an understanding of the role that distorted thinking plays in creating fears, excessive worry, and ruminations.  Nicole Evans participated in the creation of the treatment plan)    Anselmo Pickler,  [facility]

## 2023-10-06 ENCOUNTER — Ambulatory Visit (INDEPENDENT_AMBULATORY_CARE_PROVIDER_SITE_OTHER): Payer: Commercial Managed Care - PPO | Admitting: Licensed Clinical Social Worker

## 2023-10-06 DIAGNOSIS — F419 Anxiety disorder, unspecified: Secondary | ICD-10-CM | POA: Diagnosis not present

## 2023-10-06 NOTE — Progress Notes (Signed)
[facility] Counselor/Therapist Progress Note  Patient ID: Nicole Evans, MRN: [id number]    Date: 10/06/23  Time Spent: 9:03  am - 10:04 am : 61 Minutes  Treatment Type: Individual Therapy.  Reported Symptoms: sleep disturbances   Mental Status Exam: Appearance:  Casual     Behavior: Appropriate  Motor: Normal  Speech/Language:  Normal Rate  Affect: Appropriate  Mood: normal  Thought process: normal  Thought content:   WNL  Sensory/Perceptual disturbances:   WNL  Orientation: oriented to person, place, and time/date  Attention: Good  Concentration: Good  Memory: WNL  Fund of knowledge:  Good  Insight:   Good  Judgment:  Good  Impulse Control: Good   Risk Assessment: Danger to Self:  No Self-injurious Behavior: No Danger to Others: No Duty to Warn:no Physical Aggression / Violence:No  Access to Firearms a concern: No  Gang Involvement:No   Subjective:   Nicole Evans participated from home, via video, and consented to treatment. I discussed the limitations of evaluation and management by telemedicine and the availability of in person appointments. The patient expressed understanding and agreed to proceed.  Therapist participated from home office.  Nicole Evans reviewed the events of the past week. Utilizes time to share events of the week.  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       Interventions: Solution-Oriented/Positive Psychology and Narrative  Diagnosis:  Anxiety  Psychiatric Treatment: No , N/A  Treatment Plan:  Client Abilities/Strengths Nicole Evans communicates well and processes events to find a resolve (new work environment).  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  More decisive in her decision making now versus when she accepted her last position.  Positive and open mindset.   Support System: Family and Friends  Pharmacist, community of Needs Nicole Evans experiences symptoms of sleep disturbances, overstimulated, and easily frustrated with work environments since incident.   Treatment Level Weekly  Symptoms  Sleep disturbances   (Status: maintained) Frustration   (Status: maintained)  Goals:   Nicole Evans would like to focus on learning self soothing and distraction techniques to regulate her emotions independently to make better choices.  Setting standards and paying attention to her needs currently, trusting her intuition.     Target Date: 11/17/23 Frequency: Weekly  Progress: 10 Modality: individual    Therapist will provide referrals for additional resources as appropriate.  Therapist will provide psycho-education regarding Nicole Evans's diagnosis and corresponding treatment approaches and interventions. Licensed Clinical Mental Health Counselor, Anselmo Pickler, [facility] will support the patient's ability to achieve the goals identified. will employ CBT, BA, Problem-solving, Solution Focused, Mindfulness,  coping skills, & other evidenced-based practices will be used to promote progress towards healthy functioning to help manage decrease symptoms associated with her diagnosis.   Reduce overall level, frequency, and intensity of the feelings of depression, anxiety and panic evidenced by increased intuition and trusting of self to make better choices. Verbally express understanding of the relationship between feelings of depression, anxiety and their impact on thinking patterns and behaviors. Verbalize an understanding of the role that distorted thinking plays in creating fears, excessive worry, and ruminations.  Nicole Evans participated in the creation of the treatment plan)     Anselmo Pickler, [facility]

## 2023-10-11 ENCOUNTER — Encounter: Payer: Self-pay | Admitting: Physician Assistant

## 2023-10-11 ENCOUNTER — Telehealth (INDEPENDENT_AMBULATORY_CARE_PROVIDER_SITE_OTHER): Payer: Commercial Managed Care - PPO | Admitting: Physician Assistant

## 2023-10-11 ENCOUNTER — Telehealth: Payer: Commercial Managed Care - PPO | Admitting: Physician Assistant

## 2023-10-11 VITALS — Ht 64.5 in | Wt 135.0 lb

## 2023-10-11 DIAGNOSIS — F419 Anxiety disorder, unspecified: Secondary | ICD-10-CM

## 2023-10-11 DIAGNOSIS — M2550 Pain in unspecified joint: Secondary | ICD-10-CM | POA: Diagnosis not present

## 2023-10-11 DIAGNOSIS — F321 Major depressive disorder, single episode, moderate: Secondary | ICD-10-CM

## 2023-10-11 MED ORDER — BUSPIRONE HCL 10 MG PO TABS: 10.0000 mg | ORAL_TABLET | Freq: Three times a day (TID) | ORAL | 1 refills | Status: AC

## 2023-10-11 MED ORDER — AMITRIPTYLINE HCL 100 MG PO TABS: 100.0000 mg | ORAL_TABLET | Freq: Every day | ORAL | 1 refills | Status: AC

## 2023-10-11 NOTE — Progress Notes (Signed)
I acted as a Neurosurgeon for [employer], PA-C Corky Mull, LPN  Virtual Visit via Video Note   I, [name], PA, connected with  [name]  ([id number], 12-Feb-1996) on 10/11/23 at  8:20 AM EST by a video-enabled telemedicine application and verified that I am speaking with the correct person using two identifiers.  Location: Patient: Home Provider: [facility] [place] office   I discussed the limitations of evaluation and management by telemedicine and the availability of in person appointments. The patient expressed understanding and agreed to proceed.    History of Present Illness: Nicole Evans is a 28 y.o. who identifies as a female who was assigned female at birth, and is being seen today for:  Anxiety/Depression She has exhausted her Family Medical Leave Act [facility]) and is currently on short term disability.  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  She is feeling worse overall with an increase in anxiety. Also reports: worsening memory, ongoing brain fog, sleep terrors, feeling on edge She is currently taking Buspar 10 mg BID, Valium 5 mg prn, Elavil 75 mg at bedtime.  Still working with the therapist, seeing at least every other week, sometimes weekly.  Denies suicidal ideation/hi  Body Pain Describes issues of "all over" body pain/tingling/burning. Denies flu-like symptom(s) She is not sure if this is related to ongoing neurological issues, her mental health, or something else  Problems:  Patient Active Problem List   Diagnosis Date Noted   Paresthesia and pain of extremity 09/04/2018   Numbness and tingling of upper and lower extremities of both sides 09/01/2018   Leukopenia 09/01/2018   Hyponatremia 09/01/2018   Vitamin D deficiency  09/01/2018   History of migraine headaches 08/17/2018   Family history of fibromyalgia 08/17/2018   Chronic pain of multiple joints 08/17/2018   Family history of systemic lupus erythematosus 08/17/2018   Symptoms of upper respiratory infection (URI) 07/04/2017   Pregnancy 06/09/2017   Anemia during pregnancy in third trimester 04/29/2017   Thyromegaly 04/07/2017   Abnormal thyroid function test 03/13/2017   Vacuum extractor delivery, delivered 12/10/2015   Indication for care in labor or delivery 12/09/2015   History of vacuum extraction assisted delivery 09/14/2015    Allergies:  Allergies  Allergen Reactions   Penicillins Anaphylaxis, Rash and Other (See Comments)    Has patient had a PCN reaction causing immediate rash, facial/tongue/throat swelling, SOB or lightheadedness with hypotension: Yes Has patient had a PCN reaction causing severe rash involving mucus membranes or skin necrosis: No Has patient had a PCN reaction that required hospitalization No Has patient had a PCN reaction occurring within the last 10 years: No If all of the above answers are "NO", then may proceed with Cephalosporin use.   Medications:  Current Outpatient Medications:    amitriptyline (ELAVIL) 100 MG tablet, Take 1 tablet (100 mg total) by mouth at bedtime., Disp: 90 tablet, Rfl: 1   Atogepant (QULIPTA) 60 MG TABS, Take 1 tablet (60 mg total) by mouth daily in the afternoon., Disp: 12 tablet, Rfl: 0   busPIRone (BUSPAR) 10 MG tablet, Take 1 tablet (10 mg total) by mouth 3 (three) times daily., Disp: 90 tablet, Rfl: 1   cholecalciferol (VITAMIN D3) 25 MCG (1000 UT) tablet, Take 1,000 Units by mouth  daily., Disp: , Rfl:    cyanocobalamin (VITAMIN B12) 1000 MCG tablet, Take 1,000 mcg by mouth daily., Disp: , Rfl:    diazepam (VALIUM) 5 MG tablet, Take 5 mg by mouth every 12 (twelve) hours as needed., Disp: , Rfl:    levonorgestrel (MIRENA, 52 MG,) 20 MCG/DAY IUD, Take by intrauterine route., Disp: , Rfl:     modafinil (PROVIGIL) 200 MG tablet, Take 200 mg by mouth daily., Disp: , Rfl:    Multiple Vitamin (MULTIVITAMIN) tablet, Take 1 tablet by mouth daily., Disp: , Rfl:    pregabalin (LYRICA) 150 MG capsule, Take 150 mg by mouth daily., Disp: , Rfl:   Observations/Objective: Patient is well-developed, well-nourished in no acute distress.  Resting comfortably  at home.  Head is normocephalic, atraumatic.  No labored breathing.  Speech is clear and coherent with logical content.  Patient is alert and oriented at baseline.  Tearful at times, flat affect  Assessment and Plan: Anxiety (Primary); Depression, major, single episode, moderate (HCC) Uncontrolled Increase elavil to 100 mg daily Increase buspar to 10 mg three times daily  Recommend consider iop with [facility] or [facility] She is going to pursue possible Long-Term Disability with her employer Follow-up in 1 month(s), sooner if concerns I discussed with patient that if they develop any SI, to tell someone immediately and seek medical attention.  Arthralgia, unspecified joint Unclear etiology I did recommend that she reach out to her neurologist for further evaluation If no improvement or explanation from neurology, recommend office visit so we can do evaluation and order appropriate blood work/testing/etc  Follow Up Instructions: I discussed the assessment and treatment plan with the patient. The patient was provided an opportunity to ask questions and all were answered. The patient agreed with the plan and demonstrated an understanding of the instructions.  A copy of instructions were sent to the patient via MyChart unless otherwise noted below.   The patient was advised to call back or seek an in-person evaluation if the symptoms worsen or if the condition fails to improve as anticipated.  [name], [place]

## 2023-10-11 NOTE — Patient Instructions (Signed)
It was great to see you!  Take care,  [name] PA-C

## 2023-10-17 ENCOUNTER — Ambulatory Visit (INDEPENDENT_AMBULATORY_CARE_PROVIDER_SITE_OTHER): Payer: Self-pay | Admitting: Licensed Clinical Social Worker

## 2023-10-17 DIAGNOSIS — F419 Anxiety disorder, unspecified: Secondary | ICD-10-CM

## 2023-10-17 NOTE — Progress Notes (Signed)
[facility] Counselor/Therapist Progress Note  Patient ID: Nicole Evans, MRN: [id number]    Date: 10/17/23  Time Spent: 11:08  am - 12:07  : 59 Minutes  Treatment Type: Individual Therapy.  Reported Symptoms: frustrated annoyed, heightened anxiety with pending upcoming hearing Friday of this week  Mental Status Exam: Appearance:  Casual     Behavior: Appropriate and Sharing  Motor: Normal  Speech/Language:  Normal Rate  Affect: Appropriate  Mood: normal  Thought process: normal  Thought content:   WNL  Sensory/Perceptual disturbances:   WNL  Orientation: oriented to person, place, and time/date  Attention: Good  Concentration: Good  Memory: WNL  Fund of knowledge:  Good  Insight:   Good  Judgment:  Good  Impulse Control: Good   Risk Assessment: Danger to Self:  No Self-injurious Behavior: No Danger to Others: No Duty to Warn:no Physical Aggression / Violence:No  Access to Firearms a concern: No  Gang Involvement:No   Subjective:   [name] participated from home, via video, and consented to treatment. I discussed the limitations of evaluation and management by telemedicine and the availability of in person appointments. The patient expressed understanding and agreed to proceed.  Therapist participated from home office.  Leshawn reviewed the events of the past week. Will be meeting with hearing officers to finalize the job decision.       Interventions: Assertiveness/Communication, Psychologist, occupational, and Psycho-education/Bibliotherapy  Diagnosis:  Anxiety  Psychiatric Treatment: No , N/A  Treatment Plan:  Client Abilities/Strengths Micki able to advocate and motivated.    Support System: Family, Friends, and Sessions  Merchant [employer] officer of Needs Jonni would like to continue to attend sessions.     Treatment Level Weekly  Symptoms  Confused/Concerned/Unknown   (Status:  declined) Anxious   (Status: declined)  Goals:   Jadie experiences symptoms of increased concern now that the hearing is underway.  Has heard some things that are not true about her character from a former co-worker.  ocus on learning self soothing and distraction techniques to regulate her emotions independently to make better choices.     Target Date: 11/17/23 Frequency: Weekly  Progress: 0 Modality: individual    Therapist will provide referrals for additional resources as appropriate.  Therapist will provide psycho-education regarding [place]'s diagnosis and corresponding treatment approaches and interventions. Licensed Clinical Mental Health Counselor, Anselmo Pickler, [facility] will support the patient's ability to achieve the goals identified. will employ CBT, BA, Problem-solving, Solution Focused, Mindfulness,  coping skills, & other evidenced-based practices will be used to promote progress towards healthy functioning to help manage decrease symptoms associated with her diagnosis.   Reduce overall level, frequency, and intensity of the feelings of depression, anxiety and panic evidenced by decreased anxiety around job issue.  Pending hearing Friday.   Verbally express understanding of the relationship between feelings of depression, anxiety and their impact on thinking patterns and behaviors. Verbalize an understanding of the role that distorted thinking plays in creating fears, excessive worry, and ruminations.  [name] participated in the creation of the treatment plan)   Anselmo Pickler, [facility]

## 2023-10-20 ENCOUNTER — Ambulatory Visit: Payer: Commercial Managed Care - PPO | Admitting: Licensed Clinical Social Worker

## 2023-10-25 ENCOUNTER — Other Ambulatory Visit: Payer: Self-pay | Admitting: Physician Assistant

## 2023-10-25 ENCOUNTER — Ambulatory Visit (INDEPENDENT_AMBULATORY_CARE_PROVIDER_SITE_OTHER): Payer: 59 | Admitting: Licensed Clinical Social Worker

## 2023-10-25 DIAGNOSIS — F419 Anxiety disorder, unspecified: Secondary | ICD-10-CM | POA: Diagnosis not present

## 2023-10-25 DIAGNOSIS — F321 Major depressive disorder, single episode, moderate: Secondary | ICD-10-CM

## 2023-10-25 NOTE — Progress Notes (Signed)
   [facility] Counselor/Therapist Progress Note  Patient ID: Nicole Evans, MRN: [id number]    Date: 10/25/23  Time Spent: 9:03  am - 10:01 am : 58 Minutes  Treatment Type: Individual Therapy.  Reported Symptoms: feeling a sense of closure and more settled now that the hearing has been completed   Mental Status Exam: Appearance:  Casual     Behavior: Appropriate, Sharing, and Rationalizing  Motor: Normal  Speech/Language:  Clear and Coherent  Affect: Appropriate  Mood: normal  Thought process: circumstantial  Thought content:   WNL  Sensory/Perceptual disturbances:   WNL  Orientation: oriented to person, place, and time/date  Attention: Good  Concentration: Good  Memory: WNL  Fund of knowledge:  Good  Insight:   Good  Judgment:  Good  Impulse Control: Good   Risk Assessment: Danger to Self:  No Self-injurious Behavior: No Danger to Others: No Duty to Warn:no Physical Aggression / Violence:No  Access to Firearms a concern: No  Gang Involvement:No   Subjective:   Nicole Evans participated from home, via video, and consented to treatment. I discussed the limitations of evaluation and management by telemedicine and the availability of in person appointments. The patient expressed understanding and agreed to proceed.  Therapist participated from office.  Nicole Evans reviewed the events of the past week.      Interventions: Cognitive Behavioral Therapy and Solution-Oriented/Positive Psychology  Diagnosis:  Anxiety  Psychiatric Treatment: No , N/A  Treatment Plan:  Client Abilities/Strengths Nicole Evans appears to be committed to the process of growth and development.    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     Treatment  Level Weekly  Symptoms  Frustrated   (Status: maintained) Care Taking/Intentional   (Status: improved)  Goals:   Nicole Evans experiences symptoms of frustration over the last few days as she had her hearing for work.  She feels she was able to get closure but did not feel heard or supported.  Reflective of better choices to make in the future and lessons learned from this experience.  Rationalized and reflections promotes healthy and better decision making skills reducing anxiety.     Target Date: 11/17/23 Frequency: Weekly  Progress: 0 Modality: individual    Therapist will provide referrals for additional resources as appropriate.  Therapist will provide psycho-education regarding Nicole Evans's diagnosis and corresponding treatment approaches and interventions. Licensed Clinical Mental Health Counselor, Anselmo Pickler, [facility] will support the patient's ability to achieve the goals identified. will employ CBT, BA, Problem-solving, Solution Focused, Mindfulness,  coping skills, & other evidenced-based practices will be used to promote progress towards healthy functioning to help manage decrease symptoms associated with her diagnosis.   Reduce overall level, frequency, and intensity of the feelings of depression, anxiety and panic evidenced by decreased frustration and ability to respond in a controlled manner.   Verbally express understanding of the relationship between feelings of depression, anxiety and their impact on thinking patterns and behaviors. Verbalize an understanding of the role that distorted thinking plays in creating fears, excessive worry, and ruminations.  Nicole Evans participated in the creation of the treatment plan)     Anselmo Pickler, [facility]

## 2023-11-02 ENCOUNTER — Encounter: Payer: Self-pay | Admitting: Physician Assistant

## 2023-11-02 ENCOUNTER — Telehealth (INDEPENDENT_AMBULATORY_CARE_PROVIDER_SITE_OTHER): Payer: 59 | Admitting: Physician Assistant

## 2023-11-02 VITALS — Ht 64.5 in | Wt 132.0 lb

## 2023-11-02 DIAGNOSIS — F321 Major depressive disorder, single episode, moderate: Secondary | ICD-10-CM

## 2023-11-02 DIAGNOSIS — R229 Localized swelling, mass and lump, unspecified: Secondary | ICD-10-CM

## 2023-11-02 DIAGNOSIS — F419 Anxiety disorder, unspecified: Secondary | ICD-10-CM | POA: Diagnosis not present

## 2023-11-02 NOTE — Progress Notes (Signed)
 I acted as a Neurosurgeon for [employer], PA-C Corky Mull, LPN  Virtual Visit via Video Note   I, [name], PA, connected with  Jackquline Branca  ([id number], 1996-01-10) on 11/02/23 at  8:00 AM EST by a video-enabled telemedicine application and verified that I am speaking with the correct person using two identifiers.  Location: Patient: Home Provider: [place] [place] office   I discussed the limitations of evaluation and management by telemedicine and the availability of in person appointments. The patient expressed understanding and agreed to proceed.    History of Present Illness: Nicole Evans is a 28 y.o. who identifies as a female who was assigned female at birth, and is being seen today for:  Anxiety/Depression She is not feeling any better and is still having increase in anxiety.  Also still having trouble with panic attacks and night terrors. She has remained on amitriptyline 100 mg nightly and buspar 10 mg three times daily  Denies suicidal ideation/hi Reports ongoing issues with day to day functions She feels like either she is numb from anxiety or having panic attacks during the day She does not feel like she can regularly take care of herself and her children when emotions are intense Continues with regular talk therapy  Lipoma She has a suspected lipoma on her back, which was imaged via ultrasound on 03/18/23.  She states that the area is possibly growing and causing more symptom(s) She would like further evaluation and possible removal  Problems:  Patient Active Problem List   Diagnosis Date Noted   Paresthesia and pain of extremity 09/04/2018   Numbness and tingling of upper and lower extremities of both sides 09/01/2018   Leukopenia 09/01/2018   Hyponatremia 09/01/2018   Vitamin D deficiency 09/01/2018   History of migraine headaches 08/17/2018   Family history of fibromyalgia 08/17/2018   Chronic pain of multiple joints  08/17/2018   Family history of systemic lupus erythematosus 08/17/2018   Symptoms of upper respiratory infection (URI) 07/04/2017   Pregnancy 06/09/2017   Anemia during pregnancy in third trimester 04/29/2017   Thyromegaly 04/07/2017   Abnormal thyroid function test 03/13/2017   Vacuum extractor delivery, delivered 12/10/2015   Indication for care in labor or delivery 12/09/2015   History of vacuum extraction assisted delivery 09/14/2015    Allergies:  Allergies  Allergen Reactions   Penicillins Anaphylaxis, Rash and Other (See Comments)    Has patient had a PCN reaction causing immediate rash, facial/tongue/throat swelling, SOB or lightheadedness with hypotension: Yes Has patient had a PCN reaction causing severe rash involving mucus membranes or skin necrosis: No Has patient had a PCN reaction that required hospitalization No Has patient had a PCN reaction occurring within the last 10 years: No If all of the above answers are "NO", then may proceed with Cephalosporin use.   Medications:  Current Outpatient Medications:    amitriptyline (ELAVIL) 100 MG tablet, Take 1 tablet (100 mg total) by mouth at bedtime., Disp: 90 tablet, Rfl: 1   Atogepant (QULIPTA) 60 MG TABS, Take 1 tablet (60 mg total) by mouth daily in the afternoon., Disp: 12 tablet, Rfl: 0   busPIRone (BUSPAR) 10 MG tablet, Take 1 tablet (10 mg total) by mouth 3 (three) times daily., Disp: 90 tablet, Rfl: 1   cholecalciferol (VITAMIN D3) 25 MCG (1000 UT) tablet, Take 1,000 Units by mouth daily., Disp: , Rfl:    cyanocobalamin (VITAMIN B12) 1000 MCG tablet, Take 1,000 mcg by mouth daily., Disp: ,  Rfl:    diazepam (VALIUM) 5 MG tablet, Take 5 mg by mouth every 12 (twelve) hours as needed., Disp: , Rfl:    levonorgestrel (MIRENA, 52 MG,) 20 MCG/DAY IUD, Take by intrauterine route., Disp: , Rfl:    modafinil (PROVIGIL) 200 MG tablet, Take 200 mg by mouth daily., Disp: , Rfl:    Multiple Vitamin (MULTIVITAMIN) tablet, Take 1  tablet by mouth daily., Disp: , Rfl:    pregabalin (LYRICA) 150 MG capsule, Take 150 mg by mouth daily., Disp: , Rfl:   Observations/Objective: Patient is well-developed, well-nourished in no acute distress.  Resting comfortably  at home.  Head is normocephalic, atraumatic.  No labored breathing.  Speech is clear and coherent with logical content.  Patient is alert and oriented at baseline.   Assessment and Plan: Anxiety (Primary); Depression, major, single episode, moderate (HCC) Uncontrolled I have personally placed referral for IOP for Talk Therapy for Dominick at [facility] as she has still not heard from this referral She does not want to make any changes to her medications at this time so we will continue to maintain amitriptyline 100 mg daily and buspar 10 mg three times daily. I will complete long term disability paperwork when returning to office We did briefly discuss possible referral to psychiatry -- we are planning to get her enrolled with [facility] and if they are able to provide assistance with this, we will use their services - otherwise we may consider additional referral to psychiatry  3. Localized skin mass, lump, or swelling Referral to general surgery for evaluation - will defer to them for any need for additional imaging  Follow Up Instructions: I discussed the assessment and treatment plan with the patient. The patient was provided an opportunity to ask questions and all were answered. The patient agreed with the plan and demonstrated an understanding of the instructions.  A copy of instructions were sent to the patient via MyChart unless otherwise noted below.   The patient was advised to call back or seek an in-person evaluation if the symptoms worsen or if the condition fails to improve as anticipated.  [name], [place]

## 2023-11-03 ENCOUNTER — Ambulatory Visit (INDEPENDENT_AMBULATORY_CARE_PROVIDER_SITE_OTHER): Payer: 59 | Admitting: Licensed Clinical Social Worker

## 2023-11-03 DIAGNOSIS — F419 Anxiety disorder, unspecified: Secondary | ICD-10-CM

## 2023-11-03 NOTE — Progress Notes (Signed)
 [facility] Counselor/Therapist Progress Note  Patient ID: Nicole Evans, MRN: [id number]    Date: 11/03/23  Time Spent: 9:03  am - 9:59 am : 56 Minutes  Treatment Type: Individual Therapy.  Reported Symptoms: waiting on board decision has been biggest stressors  Mental Status Exam: Appearance:  Casual     Behavior: Appropriate  Motor: Normal  Speech/Language:  Normal Rate  Affect: Appropriate  Mood: anxious  Thought process: circumstantial  Thought content:   WNL  Sensory/Perceptual disturbances:   WNL  Orientation: oriented to person, place, and time/date  Attention: Good  Concentration: Good  Memory: WNL  Fund of knowledge:  Good  Insight:   Good  Judgment:  Good  Impulse Control: Good   Risk Assessment: Danger to Self:  No Self-injurious Behavior: No Danger to Others: No Duty to Warn:no Physical Aggression / Violence:No  Access to Firearms a concern: No  Gang Involvement:No   Subjective:   Nicole Evans participated from home, via video, and consented to treatment. I discussed the limitations of evaluation and management by telemedicine and the availability of in person appointments. The patient expressed understanding and agreed to proceed.  Therapist participated from home office.  Lovell reviewed the events of the past week.      Interventions: Cognitive Behavioral Therapy, Assertiveness/Communication, and Insight-Oriented  Diagnosis:  Anxiety  Psychiatric Treatment: No , N/A  Treatment Plan:  Client Abilities/Strengths [name]  appears to be committed to the process of growth and development.    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      Treatment Level Weekly  Symptoms  Anxious   (Status:  maintained) Frustrated   (Status: maintained)  Goals:   [name] experiences symptoms of focusing on current job and getting acclimated.  Concerned because board decision has not been made.  Causing increased anxiety.     Target Date: 11/17/23 Frequency: Weekly  Progress: 0 Modality: individual    Therapist will provide referrals for additional resources as appropriate.  Therapist will provide psycho-education regarding [name]'s diagnosis and corresponding treatment approaches and interventions. Licensed Clinical Mental Health Counselor, Anselmo Pickler, [facility] will support the patient's ability to achieve the goals identified. will employ CBT, BA, Problem-solving, Solution Focused, Mindfulness,  coping skills, & other evidenced-based practices will be used to promote progress towards healthy functioning to help manage decrease symptoms associated with her diagnosis.   Reduce overall level, frequency, and intensity of the feelings of depression, anxiety and panic evidenced by decreased anxious thoughts and lack of control.   Verbally express understanding of the relationship between feelings of depression, anxiety and their impact on thinking patterns and behaviors. Verbalize an understanding of the role that distorted thinking plays in creating fears, excessive worry, and ruminations.  [name] participated in the creation of the treatment plan)   Anselmo Pickler, [facility]

## 2023-11-08 ENCOUNTER — Other Ambulatory Visit: Payer: Self-pay | Admitting: Obstetrics & Gynecology

## 2023-11-08 ENCOUNTER — Telehealth: Payer: Self-pay | Admitting: Physician Assistant

## 2023-11-08 DIAGNOSIS — E049 Nontoxic goiter, unspecified: Secondary | ICD-10-CM

## 2023-11-08 DIAGNOSIS — F4310 Post-traumatic stress disorder, unspecified: Secondary | ICD-10-CM | POA: Diagnosis not present

## 2023-11-08 DIAGNOSIS — F411 Generalized anxiety disorder: Secondary | ICD-10-CM | POA: Diagnosis not present

## 2023-11-08 DIAGNOSIS — R319 Hematuria, unspecified: Secondary | ICD-10-CM | POA: Diagnosis not present

## 2023-11-08 DIAGNOSIS — Z01411 Encounter for gynecological examination (general) (routine) with abnormal findings: Secondary | ICD-10-CM | POA: Diagnosis not present

## 2023-11-08 DIAGNOSIS — Z01419 Encounter for gynecological examination (general) (routine) without abnormal findings: Secondary | ICD-10-CM | POA: Diagnosis not present

## 2023-11-08 DIAGNOSIS — Z6823 Body mass index (BMI) 23.0-23.9, adult: Secondary | ICD-10-CM | POA: Diagnosis not present

## 2023-11-08 NOTE — Telephone Encounter (Signed)
**   Disability forms from [facility]

## 2023-11-08 NOTE — Telephone Encounter (Signed)
 Received faxed document  form FMLA, to be filled out by provider. Patient requested to send it back via Fax within 2-days. Document is located in providers tray at front office.Please advise at Mobile [phone] (mobile)

## 2023-11-09 ENCOUNTER — Encounter: Payer: Self-pay | Admitting: Physician Assistant

## 2023-11-09 NOTE — Telephone Encounter (Signed)
 Forms placed in your folder to complete.

## 2023-11-10 NOTE — Telephone Encounter (Signed)
 Left message on voicemail to call office.

## 2023-11-10 NOTE — Telephone Encounter (Unsigned)
 Copied from CRM [id number]. Topic: General - Other >> Nov 10, 2023  2:02 PM Gurney Maxin H wrote: Reason for CRM: Patient returning call to Lupita Leash at office, please reach back out to patient, thanks.  [name] [phone]

## 2023-11-11 NOTE — Telephone Encounter (Signed)
 I have printed the forms and placed on your desk.

## 2023-11-15 ENCOUNTER — Ambulatory Visit (INDEPENDENT_AMBULATORY_CARE_PROVIDER_SITE_OTHER): Payer: 59 | Admitting: Licensed Clinical Social Worker

## 2023-11-15 DIAGNOSIS — F419 Anxiety disorder, unspecified: Secondary | ICD-10-CM

## 2023-11-15 NOTE — Progress Notes (Signed)
 [facility] Counselor/Therapist Progress Note  Patient ID: Nicole Evans, MRN: [id number]    Date: 11/15/23  Time Spent: 3:03  pm - 4:04 pm : 61 Minutes  Treatment Type: Individual Therapy.  Reported Symptoms: recent work incident has caused some issues with anxious thoughts from last work experience  Mental Status Exam: Appearance:  Casual     Behavior: Sharing  Motor: Normal  Speech/Language:  Normal Rate  Affect: Appropriate  Mood: anxious  Thought process: normal  Thought content:   WNL  Sensory/Perceptual disturbances:   WNL  Orientation: oriented to person, place, and time/date  Attention: Good  Concentration: Good  Memory: WNL  Fund of knowledge:  Good  Insight:   Good  Judgment:  Good  Impulse Control: Good   Risk Assessment: Danger to Self:  No Self-injurious Behavior: No Danger to Others: No Duty to Warn:no Physical Aggression / Violence:No  Access to Firearms a concern: No  Gang Involvement:No   Subjective:   [name] participated from home, via video, and consented to treatment. I discussed the limitations of evaluation and management by telemedicine and the availability of in person appointments. The patient expressed understanding and agreed to proceed.  Therapist participated from home office.  [name] reviewed the events of the past week.      Interventions: Motivational Interviewing and Solution-Oriented/Positive Psychology  Diagnosis:  Anxiety  Psychiatric Treatment: No , N/A  Treatment Plan:  Client Abilities/Strengths [name] is open and receptive to therapy.    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 Has been receiving short term disability but has a need to work as spouse is not working at this  time.  Treatment Level Weekly  Symptoms  Nervous/Anxious   (Status: maintained) Defeatist   (Status: maintained)  Goals:   Manar experiences symptoms of memory, brain fog, paralyzed/stuck, zoning out, 2 med increases in meds.     Target Date: 11/17/23 Frequency: Weekly  Progress: 30 Modality: individual    Therapist will provide referrals for additional resources as appropriate.  Therapist will provide psycho-education regarding [name]'s diagnosis and corresponding treatment approaches and interventions. Licensed Clinical Mental Health Counselor, Anselmo Pickler, [facility] will support the patient's ability to achieve the goals identified. will employ CBT, BA, Problem-solving, Solution Focused, Mindfulness,  coping skills, & other evidenced-based practices will be used to promote progress towards healthy functioning to help manage decrease symptoms associated with her diagnosis.   Reduce overall level, frequency, and intensity of the feelings of depression, anxiety and panic evidenced by decreased anxious thoughts  Verbally express understanding of the relationship between feelings of depression, anxiety and their impact on thinking patterns and behaviors. Verbalize an understanding of the role that distorted thinking plays in creating fears, excessive worry, and ruminations.  [name] participated in the creation of the treatment plan)   Anselmo Pickler, [facility]

## 2023-11-17 ENCOUNTER — Ambulatory Visit: Payer: 59 | Admitting: Licensed Clinical Social Worker

## 2023-11-18 ENCOUNTER — Telehealth: Payer: Self-pay | Admitting: *Deleted

## 2023-11-18 ENCOUNTER — Ambulatory Visit
Admission: RE | Admit: 2023-11-18 | Discharge: 2023-11-18 | Disposition: A | Discharge status: Home / Self Care | Payer: 59 | Source: Ambulatory Visit | Attending: Obstetrics & Gynecology | Admitting: Obstetrics & Gynecology

## 2023-11-18 DIAGNOSIS — E042 Nontoxic multinodular goiter: Secondary | ICD-10-CM | POA: Diagnosis not present

## 2023-11-18 DIAGNOSIS — E049 Nontoxic goiter, unspecified: Secondary | ICD-10-CM

## 2023-11-18 NOTE — Telephone Encounter (Signed)
 Copied from CRM [id number]. Topic: General - Other >> Nov 18, 2023  2:52 PM [name] C wrote: Reason for [id number] from [place] [facility] called in to infor Dr.Worley that patient is schedule to be seen there at  [facility] 03/14 11 AM DR CORNETT FOR OFFICE CONSULT

## 2023-11-21 ENCOUNTER — Other Ambulatory Visit: Payer: Self-pay | Admitting: Physician Assistant

## 2023-11-21 DIAGNOSIS — F419 Anxiety disorder, unspecified: Secondary | ICD-10-CM

## 2023-11-21 DIAGNOSIS — F321 Major depressive disorder, single episode, moderate: Secondary | ICD-10-CM

## 2023-11-21 NOTE — Telephone Encounter (Signed)
 FYI, see message.

## 2023-11-21 NOTE — Telephone Encounter (Signed)
 Forms printed and put in your folder.

## 2023-11-22 NOTE — Telephone Encounter (Signed)
 [place] forms completed and faxed to [phone].

## 2023-11-24 ENCOUNTER — Ambulatory Visit (INDEPENDENT_AMBULATORY_CARE_PROVIDER_SITE_OTHER): Admitting: Licensed Clinical Social Worker

## 2023-11-24 DIAGNOSIS — F419 Anxiety disorder, unspecified: Secondary | ICD-10-CM | POA: Diagnosis not present

## 2023-11-24 NOTE — Progress Notes (Signed)
 [facility] Counselor/Therapist Progress Note  Patient ID: Nicole Evans, MRN: [id number]    Date: 11/24/23  Time Spent: 2:05  pm - 3:01 pm : 56 Minutes  Treatment Type: Individual Therapy.  Reported Symptoms: thankful and happy but frustrated she had to go through to get to this point  Mental Status Exam: Appearance:  Neat     Behavior: Appropriate and Sharing  Motor: Normal  Speech/Language:  Normal Rate  Affect: Appropriate  Mood: normal  Thought process: normal  Thought content:   WNL  Sensory/Perceptual disturbances:   WNL  Orientation: oriented to person, place, and time/date  Attention: Good  Concentration: Good  Memory: WNL  Fund of knowledge:  Good  Insight:   Good  Judgment:  Good  Impulse Control: Good   Risk Assessment: Danger to Self:  No Self-injurious Behavior: No Danger to Others: No Duty to Warn:no Physical Aggression / Violence:No  Access to Firearms a concern: No  Gang Involvement:No   Subjective:   Nicole Evans participated from home, via video, and consented to treatment. I discussed the limitations of evaluation and management by telemedicine and the availability of in person appointments. The patient expressed understanding and agreed to proceed.  Therapist participated from home office.  Nicole Evans reviewed the events of the past week.      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  Feels like she is going to crash and does not want this to hinder her work.  Wants to be intentional and  think things through.     Treatment Level Weekly  Symptoms  Confidence   (Status: improved) Grounded   (Status: improved)  Goals:   Nicole Evans experiences symptoms of having her confidence shaken and her professional interest reevaluated through this work experience.  More aware of what types of work environments she can develop and thrive.  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  Handling the anxiety but not letting it fester, consume and eat away at her.  Will step down services as she will be starting a new job 12/05/23.   Target Date: 12/01/23 Frequency: Weekly  Progress: 0 Modality: individual    Therapist will provide referrals for additional resources as appropriate.  Therapist will provide psycho-education regarding Nicole Evans's diagnosis and corresponding treatment approaches and interventions. Licensed Clinical Mental Health Counselor, Anselmo Pickler, [facility] will support the patient's ability to achieve the goals identified. will employ CBT, BA, Problem-solving, Solution Focused, Mindfulness,  coping skills, & other evidenced-based practices will be used to promote progress towards healthy functioning to help manage decrease symptoms associated with her diagnosis.   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  Feeling more confident about her skills, reflecting and improved growth.   Verbally express understanding of the relationship between feelings of depression, anxiety and their impact on thinking patterns and behaviors. Verbalize an understanding of the role that distorted thinking plays in creating fears, excessive worry, and ruminations.  Nicole Evans participated  in the creation of the treatment plan)   Anselmo Pickler, [facility]

## 2023-11-25 DIAGNOSIS — D171 Benign lipomatous neoplasm of skin and subcutaneous tissue of trunk: Secondary | ICD-10-CM | POA: Diagnosis not present

## 2023-12-01 ENCOUNTER — Ambulatory Visit (INDEPENDENT_AMBULATORY_CARE_PROVIDER_SITE_OTHER): Admitting: Licensed Clinical Social Worker

## 2023-12-01 DIAGNOSIS — F419 Anxiety disorder, unspecified: Secondary | ICD-10-CM | POA: Diagnosis not present

## 2023-12-01 NOTE — Progress Notes (Signed)
  Behavioral Health Counselor/Therapist Progress Note  Patient ID: Nicole Evans, MRN: [id number]    Date: 12/01/23  Time Spent: 9:04  am - 10:00 am : 56 Minutes  Treatment Type: Individual Therapy.  Reported Symptoms: feeling accomplished in not being fixated on the past work event and excited   Mental Status Exam: Appearance:  Casual     Behavior: Sharing and Rationalizing  Motor: Normal  Speech/Language:  Normal Rate  Affect: Appropriate  Mood: normal  Thought process: normal  Thought content:   WNL  Sensory/Perceptual disturbances:   WNL  Orientation: oriented to person, place, and time/date  Attention: Good  Concentration: Good  Memory: WNL  Fund of knowledge:  Good  Insight:   Good  Judgment:  Good  Impulse Control: Good   Risk Assessment: Danger to Self:  No Self-injurious Behavior: No Danger to Others: No Duty to Warn:no Physical Aggression / Violence:No  Access to Firearms a concern: No  Gang Involvement:No   Subjective:   Nicole Evans participated from home, via video, and consented to treatment. I discussed the limitations of evaluation and management by telemedicine and the availability of in person appointments. The patient expressed understanding and agreed to proceed.  Therapist participated from home office.  Tangia reviewed the events of the past week.      Interventions: Solution-Oriented/Positive Psychology  Diagnosis:  Anxiety  Psychiatric Treatment: No , N/A  Treatment Plan:  Client Abilities/Strengths Nicole Evans is becoming more aware and reflective in her thought processing.    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     Treatment Level Weekly  Symptoms  Hopeful   (Status: improved) Optimistic   (Status: improved)  Goals:    Nicole Evans experiences symptoms of feeling decisive about putting L&D work-still feeling triggered but aware and needing more time for healing.  More self aware and advocating for self.  Doing well overall and will be pausing sessions to start new job and review benefits and schedule.  Shared concerns with father and mother relationship as something to explore for support when she returns.  Shared she has a new perspective and has been challenged in her thinking.  Overall proud of herself and feeling much better.     Target Date: 12/01/23 Frequency: Weekly  Progress: 87% Modality: individual    Therapist will provide referrals for additional resources as appropriate.  Therapist will provide psycho-education regarding Nicole Evans's diagnosis and corresponding treatment approaches and interventions. Licensed Clinical Mental Health Counselor, Anselmo Pickler, [facility] will support the patient's ability to achieve the goals identified. will employ CBT, BA, Problem-solving, Solution Focused, Mindfulness,  coping skills, & other evidenced-based practices will be used to promote progress towards healthy functioning to help manage decrease symptoms associated with her diagnosis.   Reduce overall level, frequency, and intensity of the feelings of depression, anxiety and panic evidenced by decreased anxiety and feelings of insecurity about work abilities/confidence levels. Verbally express understanding of the relationship between feelings of depression, anxiety and their impact on thinking patterns and behaviors. Verbalize an understanding of the role that distorted thinking plays in creating fears, excessive worry, and ruminations.  Nicole Evans participated in the creation of the treatment plan)   Anselmo Pickler, [facility]

## 2023-12-09 ENCOUNTER — Telehealth: Payer: Self-pay | Admitting: Neurology

## 2023-12-09 NOTE — Telephone Encounter (Signed)
 Copied from CRM [id number]. Topic: Clinical Support - Speak With Nurse  >> Dec 09, 2023  1:48 PM [name] wrote:  Gentry, Alexis NICOLE called about Clinical Support - Speak With Nurse.  Additional details:    Patient is requesting that none of he records to be sent to any third part including to disability unless it is sent in from the patient. Patient can be reached at [phone]

## 2023-12-09 NOTE — Telephone Encounter (Signed)
 Copied from CRM [id number]. Topic: Clinical Support - Speak With Nurse  >> Dec 09, 2023  1:46 PM [name] wrote:  Sledge, Alexis Gentry called about Clinical Support - Speak With Nurse.  Additional details:    Patient stated that she was supposed to be contacted for a skin biopsy but she have not been contacted et. She is asking for a call back at [phone]

## 2023-12-12 ENCOUNTER — Telehealth: Payer: Self-pay | Admitting: *Deleted

## 2023-12-12 NOTE — Telephone Encounter (Signed)
 Noted

## 2023-12-12 NOTE — Telephone Encounter (Signed)
 Copied from CRM [id number]. Topic: General - Other >> Dec 12, 2023  8:46 AM Raven B wrote: Reason for CRM: Patient called advising if any disability forms are faxed over for her please do not complete them as she does not yet want any of her information released at this time.  Call back # [phone]

## 2023-12-12 NOTE — Telephone Encounter (Signed)
 Noted.

## 2023-12-13 ENCOUNTER — Encounter: Payer: Self-pay | Admitting: Neurology

## 2023-12-26 NOTE — Telephone Encounter (Signed)
 Repeat message, please see prior patient message

## 2024-01-02 ENCOUNTER — Other Ambulatory Visit: Payer: Self-pay | Admitting: Neurology

## 2024-01-02 DIAGNOSIS — R5382 Chronic fatigue, unspecified: Secondary | ICD-10-CM

## 2024-01-02 DIAGNOSIS — G4719 Other hypersomnia: Secondary | ICD-10-CM

## 2024-01-02 DIAGNOSIS — G47411 Narcolepsy with cataplexy: Secondary | ICD-10-CM

## 2024-01-02 MED ORDER — MODAFINIL 200 MG PO TABS
200.0000 mg | ORAL_TABLET | Freq: Every morning | ORAL | 5 refills | Status: DC
Start: 2024-01-02 — End: 2024-05-23

## 2024-01-11 ENCOUNTER — Ambulatory Visit: Admitting: Psychiatry

## 2024-01-18 ENCOUNTER — Encounter: Payer: Self-pay | Admitting: Physician Assistant

## 2024-01-18 ENCOUNTER — Telehealth (INDEPENDENT_AMBULATORY_CARE_PROVIDER_SITE_OTHER): Admitting: Physician Assistant

## 2024-01-18 VITALS — Ht 64.5 in | Wt 138.0 lb

## 2024-01-18 DIAGNOSIS — F321 Major depressive disorder, single episode, moderate: Secondary | ICD-10-CM

## 2024-01-18 DIAGNOSIS — F419 Anxiety disorder, unspecified: Secondary | ICD-10-CM

## 2024-01-18 DIAGNOSIS — F4310 Post-traumatic stress disorder, unspecified: Secondary | ICD-10-CM

## 2024-01-18 DIAGNOSIS — Z711 Person with feared health complaint in whom no diagnosis is made: Secondary | ICD-10-CM | POA: Diagnosis not present

## 2024-01-18 NOTE — Progress Notes (Signed)
 I acted as a Neurosurgeon for [employer], PA-C Jerilyn Monte, LPN  Virtual Visit via Video Note   I, Alexander Iba, PA, connected with  Nicole Evans  ([id number], June 07, 1996) on 01/18/24 at  8:20 AM EDT by a video-enabled telemedicine application and verified that I am speaking with the correct person using two identifiers.  Location: Patient: Home Provider: [place] office   I discussed the limitations of evaluation and management by telemedicine and the availability of in person appointments. The patient expressed understanding and agreed to proceed.    History of Present Illness: Nicole Evans is a 28 y.o. who identifies as a female who was assigned female at birth, and is being seen today for anxiety/depression.   Pt currently taking Amitriptyline  100 mg daily, Buspar  10 mg TID. Pt not sure if Buspar  is working for her now, symptoms have increased. Pt had to stop therapy due to insurance change. She is scheduled to see a new psychiatrist tomorrow. She is not sure if they will be affordable yet. She now has insurance through [employer]. She has long-term disability through prior employer She has been looking at jobs but cannot find anything that she is comfortable applying to given her social anxiety disorder and PTSD  She is feeling very uncomfortable going outside of her home She complains of brain fog and memory issues  She continues to see her neurologist regularly Denies suicidal ideation/hi      Problems:  Patient Active Problem List   Diagnosis Date Noted   Paresthesia and pain of extremity 09/04/2018   Numbness and tingling of upper and lower extremities of both sides 09/01/2018   Leukopenia 09/01/2018   Hyponatremia 09/01/2018   Vitamin D  deficiency 09/01/2018   History of migraine headaches 08/17/2018   Family history of fibromyalgia 08/17/2018   Chronic pain of multiple joints 08/17/2018   Family history of systemic lupus  erythematosus 08/17/2018   Symptoms of upper respiratory infection (URI) 07/04/2017   Pregnancy 06/09/2017   Anemia during pregnancy in third trimester 04/29/2017   Thyromegaly 04/07/2017   Abnormal thyroid  function test 03/13/2017   Vacuum extractor delivery, delivered 12/10/2015   Indication for care in labor or delivery 12/09/2015   History of vacuum extraction assisted delivery 09/14/2015    Allergies:  Allergies  Allergen Reactions   Penicillins Anaphylaxis, Rash and Other (See Comments)    Has patient had a PCN reaction causing immediate rash, facial/tongue/throat swelling, SOB or lightheadedness with hypotension: Yes Has patient had a PCN reaction causing severe rash involving mucus membranes or skin necrosis: No Has patient had a PCN reaction that required hospitalization No Has patient had a PCN reaction occurring within the last 10 years: No If all of the above answers are "NO", then may proceed with Cephalosporin use.   Medications:  Current Outpatient Medications:    amitriptyline  (ELAVIL ) 100 MG tablet, Take 1 tablet (100 mg total) by mouth at bedtime., Disp: 90 tablet, Rfl: 1   Atogepant  (QULIPTA ) 60 MG TABS, Take 1 tablet (60 mg total) by mouth daily in the afternoon., Disp: 12 tablet, Rfl: 0   busPIRone  (BUSPAR ) 10 MG tablet, Take 1 tablet (10 mg total) by mouth 3 (three) times daily., Disp: 90 tablet, Rfl: 1   cholecalciferol (VITAMIN D3) 25 MCG (1000 UT) tablet, Take 1,000 Units by mouth daily., Disp: , Rfl:    cyanocobalamin  (VITAMIN B12) 1000 MCG tablet, Take 1,000 mcg by mouth daily., Disp: , Rfl:  diazepam (VALIUM) 5 MG tablet, Take 5 mg by mouth every 12 (twelve) hours as needed., Disp: , Rfl:    levonorgestrel  (MIRENA , 52 MG,) 20 MCG/DAY IUD, Take by intrauterine route., Disp: , Rfl:    Multiple Vitamin (MULTIVITAMIN) tablet, Take 1 tablet by mouth daily., Disp: , Rfl:    pregabalin (LYRICA) 150 MG capsule, Take 150 mg by mouth daily., Disp: , Rfl:    Observations/Objective: Patient is well-developed, well-nourished in no acute distress.  Resting comfortably  at home.  Head is normocephalic, atraumatic.  No labored breathing.  Speech is clear and coherent with logical content.  Patient is alert and oriented at baseline.   Assessment and Plan: Anxiety (Primary); Depression, major, single episode, moderate (HCC); PTSD (post-traumatic stress disorder) Ongoing Uncontrolled Denies suicidal ideation/hi Because she is seeing psychiatry tomorrow I do not feel comfortable changing medications today -- we did discuss option of increasing elavil  to 150 mg daily vs buspar  to 20 mg three times daily  Discussed importance of finding a counselor -- she is going to look into this Follow up in 2 months to review long-term disability need  Concern about memory Continue to work on prioritizing mental health Encouraged her to follow closely with her neurologist about this issue  Follow Up Instructions: I discussed the assessment and treatment plan with the patient. The patient was provided an opportunity to ask questions and all were answered. The patient agreed with the plan and demonstrated an understanding of the instructions.  A copy of instructions were sent to the patient via MyChart unless otherwise noted below.   The patient was advised to call back or seek an in-person evaluation if the symptoms worsen or if the condition fails to improve as anticipated.  I spent a total of 30 minutes on this visit, today 01/18/24, which included reviewing previous notes from neurology, discussing plan of care with patient and using shared-decision making on next steps and documenting the findings in the note.   Alexander Iba, [place]

## 2024-01-19 ENCOUNTER — Ambulatory Visit (INDEPENDENT_AMBULATORY_CARE_PROVIDER_SITE_OTHER): Admitting: Psychiatry

## 2024-01-19 ENCOUNTER — Other Ambulatory Visit: Payer: Self-pay

## 2024-01-19 ENCOUNTER — Encounter: Payer: Self-pay | Admitting: Psychiatry

## 2024-01-19 VITALS — BP 117/77 | HR 93 | Temp 98.4°F | Ht 64.5 in | Wt 137.8 lb

## 2024-01-19 DIAGNOSIS — F411 Generalized anxiety disorder: Secondary | ICD-10-CM | POA: Diagnosis not present

## 2024-01-19 DIAGNOSIS — F4310 Post-traumatic stress disorder, unspecified: Secondary | ICD-10-CM

## 2024-01-19 MED ORDER — HYDROXYZINE HCL 25 MG PO TABS
25.0000 mg | ORAL_TABLET | Freq: Three times a day (TID) | ORAL | 2 refills | Status: AC | PRN
Start: 2024-01-19 — End: ?

## 2024-01-19 MED ORDER — DULOXETINE HCL 20 MG PO CPEP
ORAL_CAPSULE | ORAL | 0 refills | Status: AC
Start: 2024-01-19 — End: ?
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
## 2024-02-08 ENCOUNTER — Encounter: Payer: Self-pay | Admitting: Neurology

## 2024-02-23 ENCOUNTER — Telehealth: Admitting: Psychiatry

## 2024-02-27 ENCOUNTER — Telehealth: Admitting: Psychiatry

## 2024-02-27 DIAGNOSIS — F333 Major depressive disorder, recurrent, severe with psychotic symptoms: Secondary | ICD-10-CM

## 2024-02-27 DIAGNOSIS — F4310 Post-traumatic stress disorder, unspecified: Secondary | ICD-10-CM

## 2024-02-27 DIAGNOSIS — F514 Sleep terrors [night terrors]: Secondary | ICD-10-CM | POA: Diagnosis not present

## 2024-02-27 DIAGNOSIS — F411 Generalized anxiety disorder: Secondary | ICD-10-CM

## 2024-02-27 MED ORDER — DULOXETINE HCL 60 MG PO CPEP
60.0000 mg | ORAL_CAPSULE | Freq: Every day | ORAL | 0 refills | Status: DC
Start: 2024-02-27 — End: 2024-03-12

## 2024-02-27 NOTE — Addendum Note (Signed)
 Addended by: Mahiya Kercheval on: 02/27/2024 03:07 PM   Modules accepted: Level of Service
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
## 2024-03-07 ENCOUNTER — Encounter: Payer: Self-pay | Admitting: Physician Assistant

## 2024-03-12 ENCOUNTER — Telehealth (INDEPENDENT_AMBULATORY_CARE_PROVIDER_SITE_OTHER): Admitting: Psychiatry

## 2024-03-12 DIAGNOSIS — F514 Sleep terrors [night terrors]: Secondary | ICD-10-CM | POA: Diagnosis not present

## 2024-03-12 DIAGNOSIS — F411 Generalized anxiety disorder: Secondary | ICD-10-CM | POA: Diagnosis not present

## 2024-03-12 DIAGNOSIS — F4310 Post-traumatic stress disorder, unspecified: Secondary | ICD-10-CM | POA: Diagnosis not present

## 2024-03-12 DIAGNOSIS — F333 Major depressive disorder, recurrent, severe with psychotic symptoms: Secondary | ICD-10-CM

## 2024-03-12 MED ORDER — VORTIOXETINE HBR 5 MG PO TABS
5.0000 mg | ORAL_TABLET | Freq: Every day | ORAL | 0 refills | Status: AC
Start: 2024-03-12 — End: ?

## 2024-03-12 MED ORDER — DULOXETINE HCL 30 MG PO CPEP
30.0000 mg | ORAL_CAPSULE | Freq: Every day | ORAL | 0 refills | Status: AC
Start: 2024-03-12 — End: ?
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
## 2024-03-14 ENCOUNTER — Encounter: Payer: Self-pay | Admitting: Physician Assistant

## 2024-03-14 ENCOUNTER — Telehealth: Payer: Self-pay

## 2024-03-14 ENCOUNTER — Telehealth: Admitting: Physician Assistant

## 2024-03-14 VITALS — Ht 64.5 in | Wt 138.0 lb

## 2024-03-14 DIAGNOSIS — F4310 Post-traumatic stress disorder, unspecified: Secondary | ICD-10-CM

## 2024-03-14 DIAGNOSIS — F333 Major depressive disorder, recurrent, severe with psychotic symptoms: Secondary | ICD-10-CM

## 2024-03-14 NOTE — Telephone Encounter (Signed)
 Received fax requesting a Prior Authorization for the patients Trintellix 5 mg tablets initiated via CoverMyMeds sent to the patients insurance   Approved  Coverage 03-14-24/03-14-25

## 2024-03-14 NOTE — Progress Notes (Signed)
 I acted as a Neurosurgeon for [employer], PA-C Arland Chute, LPN  Virtual Visit via Video Note   I, Lucie Buttner, PA, connected with  Nicole Evans  ([id number], July 16, 1996) on 03/14/24 at  2:40 PM EDT by a video-enabled telemedicine application and verified that I am speaking with the correct person using two identifiers.  Location: Patient: Home Provider: [place] office   I discussed the limitations of evaluation and management by telemedicine and the availability of in person appointments. The patient expressed understanding and agreed to proceed.    History of Present Illness: Nicole Evans is a 28 y.o. who identifies as a female who was assigned female at birth, and is being seen today for anxiety/depression.  She is currently seeing Dorn Der, NP with [facility]. She started seeing him 5/825.  She has seen him 3 times. Most recently, she was put on duloxetine  with hopes that this would help her mood and ongoing chronic pain. She reports that this medication made her feel like she was seeing and hearing things; she was told to stop this and then is hoping to start trintellix. She has not started trintellix yet.  She was also given as needed hydroxyzine  for her panic attack symptom(s) and doesn't like the way that it makes her feel - feels like its causing increased fatigue and doesn't like that its just a band-aid to her symptom(s).   She has yet to get in with a new therapist. She was seeing Tawni Louder with [facility] but they were no longer in network so she has not seen her since 12/01/23. She has been recommended to see a trauma therapist. She had a phone call from a therapist that her psychiatrist recommended but she missed the phone call and has forgotten to follow up with this.  She continues to have frequent panic attacks, night terrors, and still not wanting to leave her house.  She  feels like she is pushing through the day  She has young children and no family close by   Problems:  Patient Active Problem List   Diagnosis Date Noted   Generalized anxiety disorder 01/19/2024   PTSD (post-traumatic stress disorder) 01/19/2024   Paresthesia and pain of extremity 09/04/2018   Numbness and tingling of upper and lower extremities of both sides 09/01/2018   Leukopenia 09/01/2018   Hyponatremia 09/01/2018   Vitamin D  deficiency 09/01/2018   History of migraine headaches 08/17/2018   Family history of fibromyalgia 08/17/2018   Chronic pain of multiple joints 08/17/2018   Family history of systemic lupus erythematosus 08/17/2018   Symptoms of upper respiratory infection (URI) 07/04/2017   Pregnancy 06/09/2017   Anemia during pregnancy in third trimester 04/29/2017   Thyromegaly 04/07/2017   Abnormal thyroid  function test 03/13/2017   Vacuum extractor delivery, delivered 12/10/2015   Indication for care in labor or delivery 12/09/2015   History of vacuum extraction assisted delivery 09/14/2015    Allergies:  Allergies  Allergen Reactions   Penicillins Anaphylaxis, Rash and Other (See Comments)    Has patient had a PCN reaction causing immediate rash, facial/tongue/throat swelling, SOB or lightheadedness with hypotension: Yes Has patient had a PCN reaction causing severe rash involving mucus membranes or skin necrosis: No Has patient had a PCN reaction that required hospitalization No Has patient had a PCN reaction occurring within the last 10 years: No If all of the above answers are NO, then may proceed with Cephalosporin use.  Medications:  Current Outpatient Medications:    amitriptyline  (ELAVIL ) 100 MG tablet, Take 1 tablet (100 mg total) by mouth at bedtime., Disp: 90 tablet, Rfl: 1   Atogepant  (QULIPTA ) 60 MG TABS, Take 1 tablet (60 mg total) by mouth daily in the afternoon., Disp: 12 tablet, Rfl: 0   busPIRone  (BUSPAR ) 10 MG tablet, Take 1 tablet (10  mg total) by mouth 3 (three) times daily., Disp: 90 tablet, Rfl: 1   cholecalciferol (VITAMIN D3) 25 MCG (1000 UT) tablet, Take 1,000 Units by mouth daily., Disp: , Rfl:    cyanocobalamin  (VITAMIN B12) 1000 MCG tablet, Take 1,000 mcg by mouth daily., Disp: , Rfl:    DULoxetine  (CYMBALTA ) 30 MG capsule, Take 1 capsule (30 mg total) by mouth daily., Disp: 20 capsule, Rfl: 0   hydrOXYzine  (ATARAX ) 25 MG tablet, Take 1 tablet (25 mg total) by mouth 3 (three) times daily as needed., Disp: 90 tablet, Rfl: 2   levonorgestrel  (MIRENA , 52 MG,) 20 MCG/DAY IUD, Take by intrauterine route., Disp: , Rfl:    Multiple Vitamin (MULTIVITAMIN) tablet, Take 1 tablet by mouth daily., Disp: , Rfl:    pregabalin (LYRICA) 150 MG capsule, Take 150 mg by mouth daily., Disp: , Rfl:    vortioxetine HBr (TRINTELLIX) 5 MG TABS tablet, Take 1 tablet (5 mg total) by mouth daily. (Patient not taking: Reported on 03/14/2024), Disp: 30 tablet, Rfl: 0  Observations/Objective: Patient is well-developed, well-nourished in no acute distress.  Resting comfortably  at home.  Head is normocephalic, atraumatic.  No labored breathing.  Speech is clear and coherent with logical content.  Patient is alert and oriented at baseline.    Assessment and Plan: PTSD (post-traumatic stress disorder) (Primary); Severe episode of recurrent major depressive disorder, with psychotic features (HCC)  Ongoing I had a discussion with her that I'm concerned that she has had no real improvement in her symptom(s) since she has been out of work Recommend that she push herself to start trauma-focused therapy and continue to look for in-network providers that can help her I strongly feel she would benefit from PHP or IOP but she reports that she cannot make this work with ongoing commitments with her family I discussed with patient that if they develop any SI, to tell someone immediately and seek medical attention. Recommend close follow up with psychiatry  and psychology Follow up in 3 month(s), sooner if concerns   Follow Up Instructions: I discussed the assessment and treatment plan with the patient. The patient was provided an opportunity to ask questions and all were answered. The patient agreed with the plan and demonstrated an understanding of the instructions.  A copy of instructions were sent to the patient via MyChart unless otherwise noted below.   The patient was advised to call back or seek an in-person evaluation if the symptoms worsen or if the condition fails to improve as anticipated.  Lucie Buttner, [place]

## 2024-04-02 ENCOUNTER — Encounter: Payer: Self-pay | Admitting: Physician Assistant

## 2024-04-02 NOTE — Telephone Encounter (Signed)
 I have printed the forms to be completed and put them in your folder.

## 2024-05-23 ENCOUNTER — Encounter: Payer: Self-pay | Admitting: Neurology

## 2024-05-23 ENCOUNTER — Other Ambulatory Visit: Payer: Self-pay

## 2024-05-23 ENCOUNTER — Ambulatory Visit: Attending: Neurology | Admitting: Neurology

## 2024-05-23 VITALS — Ht 65.0 in | Wt 130.0 lb

## 2024-05-23 DIAGNOSIS — G4719 Other hypersomnia: Secondary | ICD-10-CM

## 2024-05-23 DIAGNOSIS — G47411 Narcolepsy with cataplexy: Secondary | ICD-10-CM

## 2024-05-23 DIAGNOSIS — R208 Other disturbances of skin sensation: Secondary | ICD-10-CM

## 2024-05-23 DIAGNOSIS — G43009 Migraine without aura, not intractable, without status migrainosus: Secondary | ICD-10-CM

## 2024-05-23 DIAGNOSIS — R5382 Chronic fatigue, unspecified: Secondary | ICD-10-CM

## 2024-05-23 DIAGNOSIS — M792 Neuralgia and neuritis, unspecified: Secondary | ICD-10-CM

## 2024-05-23 DIAGNOSIS — R270 Ataxia, unspecified: Secondary | ICD-10-CM

## 2024-05-23 MED ORDER — MODAFINIL 200 MG PO TABS
200.0000 mg | ORAL_TABLET | Freq: Every morning | ORAL | 5 refills | Status: AC
Start: 2024-05-23 — End: 2024-11-19

## 2024-05-23 MED ORDER — NURTEC 75 MG PO TBDP
75.0000 mg | ORAL_TABLET | ORAL | 5 refills | Status: AC
Start: 2024-05-23 — End: ?

## 2024-05-23 MED ORDER — PREGABALIN 150 MG PO CAPS
150.0000 mg | ORAL_CAPSULE | Freq: Two times a day (BID) | ORAL | 5 refills | Status: AC
Start: 2024-05-23 — End: ?

## 2024-05-23 MED ORDER — AMITRIPTYLINE HCL 75 MG PO TABS
75.0000 mg | ORAL_TABLET | Freq: Every evening | ORAL | 5 refills | Status: AC
Start: 2024-05-23 — End: ?

## 2024-05-23 NOTE — Progress Notes (Signed)
 [facility] - Alexis Gentry      Date of Visit:   May 23, 2024  Patient Name: Alexis Gentry  Date of Birth:  1995/09/18  MR Number:   [id number]    Telehealth:  The Patient has given verbal consent for delivery of health care via telehealth.   The patient is located at Home in [place]   The encounter provider is located at their Medical Office in [place]   Epic Video Client was utilized for Real Time/Synchronous Telehealth.   The time spent in medical discussion during this visit was 14 minutes.        Subjective     History of Present Illness  Alexis Gentry is a 28 year old female with chronic migraines and balance issues who presents for neurological follow-up.    The patient reports that she has been having migraine headaches almost daily.  She has tried several medications, including amitriptyline  75 mg and Lyrica , with partial relief.   Her insurance issues have prevented her from trying Qulipta .     She continues to have burning sensation and ataxia, with worsening balance issues over the past few months.   These symptoms are exacerbated by being startled, experiencing extreme temperatures, or fear, and she describes these episodes as similar to 'cataplexy'.  He had full evaluation at [facility].  There was no conclusive diagnosis.    He continues to have daytime sleepiness.  She has been taking modafinil  100 mg in the morning.  She also takes Lyrica  150 mg twice a day.  He reports no new side effects.    She is awaiting a response regarding the waiver of her school loans, which she submitted but has not yet received an answer.    Medical History[1]  Medications Ordered Prior to Encounter[2]          Vitals:    05/23/24 1352   Weight: 59 kg (130 lb)   Height: 1.651 m (5' 5)   Body mass index is 21.63 kg/m.  NEUROLOGICAL EXAM: The patient is fully awake and alert.  She answers questions and follows commands appropriately. Speech is clear.  CRANIAL NERVES: There is no  eyelid ptosis, nystagmus, facial weakness, or tongue weakness.  MOTOR: There is no fixation on arm rotating test.  She is able to raise the arms above shoulder level symmetrically.  No tremors are noted in the hands.  He is able to open and close her fist symmetrically.  There is no asterixis.     Results       Results for orders placed or performed in visit on 08/04/22   CT Head WO Contrast    Narrative    [facility]  [facility]    [name], [name] PERFORMED AT:  [id number]       DOB:10/08/1995                 [address], [address]  08/04/2022     AGE:75   Gender:F              Physicians Only: [id number]    CT HEAD WITHOUT CONTRAST    HISTORY: Headaches, dizziness, light sensitivity following recent trauma.    COMPARISON: 10/12/2019 MRI.    TECHNIQUE: Non-contrast CT of the head was obtained.  FINDINGS:  There is no acute intracranial hemorrhage.  There is no evidence of acute territorial infarction. The white matter  attenuation is within normal limits.  There is no intracranial mass or mass effect. There is no hydrocephalus.  No acute calvarial or skull base fracture is identified. There is no  destructive osseous lesion.  The imaged paranasal sinuses and mastoid cells are predominantly clear.      Impression       No evidence of acute intracranial abnormality.      Electronically signed by: Alexis Gentry M.D.  [facility]    DF: 08/08/22    The following dose reduction techniques were utilized: automated exposure  control and/or adjustment of the mA and/or kV according to patient size,  and the use of iterative reconstruction technique.      MRI Brain W WO Contrast    Narrative    HISTORY: Weakness in both lower extremities. Progressive muscular  rigidity and spasm. Chronic fatigue. Correlate with MRI examination of  the cervical spine and thoracic spine March 02, 2019.    COMPARISON: No previous brain  imaging is available. MRI examinations of  the cervical spine and thoracic spine dated March 02, 2019 are available.    TECHNIQUE: Non-contrast and contrast enhanced imaging was obtained.  The  patient received  6 mL of   Gadavist  contrast material via IV  administration.    FINDINGS: The brain volume is normal. No mass, hemorrhage, or  hydrocephalus is detected.  The volume and the signal of the white  matter is normal in both cerebral hemispheres and in both cerebellar  hemispheres.  The basilar cisterns are clear. The midline structures of  the brain appear intact. The foramen magnum is patent.  There is no  evidence of a Chiari malformation.    The diffusion weighted images are normal.  There is no evidence of acute  or recent infarction.  There are appropriate flow-voids in the major  arterial and venous structures.     After the administration of contrast material, there is normal pattern  of enhancement of the brain parenchyma and meningeal surfaces.    There is a mild generalized thickening of the calvarium.    The paranasal sinuses are aerated.  There is a small amount of mucosal  thickening in the ethmoid and maxillary sinus bilaterally.  No fluid is  seen in the paranasal sinuses.  The nasopharynx has a normal  configuration.      Impression    1. No intracranial mass, hemorrhage, or hydrocephalus is detected.  2. The volume and the signal of the white matter is normal in both  cerebral hemispheres and in both cerebellar hemispheres.  3. There is normal pattern of enhancement of the brain parenchyma and  meningeal surfaces.  4. There is a mild generalized thickening of the calvarium.    Belvie Lack, MD   10/12/2019 2:41 PM     Overnight polysomnogram from February 26, 2022:     Indication:  Excessive daytime hypersomnolence.     SUMMARY OF FINDINGS:  The total sleep time was 348.5 minutes, with a sleep efficiency of 88.5%.    The latency to sleep onset was 9.7 minutes with an REM latency of 67.0   minutes.   The patient achieved all stages of sleep during the study.  The   arousal index was 10.8 with an awakening index of 4.0.  Most arousals seen   were spontaneous.  The periodic limb movement index was 2.6.     The total apnea-hypopnea index using 4% oxygen desaturation criteria was   0.5, 4.6 using 3% criteria.  The respiratory disturbance index was 5.7, AHI  and RDI were slightly higher in non-supine positions.  The minimum oxygen   saturation during sleep was 94%, with an average oxygen saturation during   sleep of 97.2%.     Sleep architecture is noted to be relatively fragmented.  Heart rate ranged  from 70s to 90s, regular rhythm.     IMPRESSION:  1.  Mild/borderline upper airway resistance syndrome, without clear   evidence for more significant sleep disturbed breathing or sleep apnea.    Conservative measures include elevating the head of the bed or using a   wedge pillow, weight loss if appropriate for goal BMI less than 25.  2.  No evidence for significant periodic limb movements of sleep.  3.  First night effect, given the fragmented sleep architecture, not   uncommon in laboratory setting.  4.  Multiple sleep latency tests was performed in conjunction with the   study, results reported separately.     Thank you for referring this patient for evaluation.        D: 03/02/2022 09:58 PM [place]ell[place] ([id number])  T: 03/03/2022 12:58 AM by DCM [id number]     MULTIPLE SLEEP LATENCY TEST     Date of study: 02/27/2022     Indication: Daytime hypersomnolence     Scoring Table     Naps Sleep Latency REM           1 3.6 minutes No   2 1.6 minutes Yes   3 2.3 minutes No   4 7.6 minutes No   5 3.6 minutes No           Average Sleep Latency: 3.8 minutes 1/5 REMs         IMPRESSION:   Abnormal study indicating severe hypersomnolence, with 1 sleep onset REM noted.  These findings are suggestive, but not diagnostic, of narcolepsy in the proper clinical setting.  Clinical correlation recommended.        Eric B.  Sumner, MD  Director, [facility]  Assistant Professor, [employer], Counsellor in Neurology, Sleep Medicine, Clinical Neurophysiology and Vascular Neurology        MSLT NORMALS: In normal patients, the MSLT sleep latency is longer than 15 minutes and only 0 to 1 REM periods occur during the test. A mean sleep latency during the MSLT between 10 and 15 minutes represents mild sleepiness; less than 10 minutes is considered to be abnormal; shorter than 5 minutes is considered to be severe (pathologic) sleepiness. Two or more REM periods in the five naps is characteristic of narcolepsy. However, it is important to note that REM may occur during naps in patients who are sleep deprived or withdrawing from REM depressing medications such as anti-depressants and stimulants.               Electronically signed by Sumner Camellia NOVAK, MD at 03/02/2022       Orders Only on 01/05/2019   Component Date Value Ref Range Status    BUN 01/05/2019 15  6 - 20 mg/dL Final    Creatinine 95/75/7979 0.66  0.57 - 1.00 mg/dL Final    EGFR 01/05/2019 125  >59 mL/min/1.73 Final    EGFR 01/05/2019 144  >59 mL/min/1.73  Final    BUN / Creatinine Ratio 01/05/2019 23  9 - 23 Final       Assessment/Plan     Assessment & Plan     Diagnosis ICD-10-CM Associated Order   1. Migraine without aura and without status migrainosus, not intractable  G43.009 Nurtec 75 MG Tablet Dispersible      2. Burning sensation  R20.8 Derm biopsy      3. Ataxia  R27.0       4. Chronic fatigue  R53.82 modafinil  (PROVIGIL ) 200 MG tablet     Derm biopsy      5. Neuropathic pain  M79.2 amitriptyline  (ELAVIL ) 75 MG tablet     pregabalin  (LYRICA ) 150 MG capsule     Derm biopsy      6. 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    Migraine without aura  She  continues to have chronic migraines despite treatment with amitriptyline  and Lyrica .  Qulipta  was not approved, and Ajovy was ineffective in the past.  I advised her to try Nurtec ODT 75 mg every other day for migraine prophylaxis.  She will continue to take amitriptyline  75 mg at bedtime and Lyrica  50 mg twice a day.  I provided new prescriptions for Nurtec, amitriptyline , and Lyrica .    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   Symptoms suggest small fiber neuropathy.   Previous evaluations at [facility] were inconclusive.   I am going to perform a punch skin biopsy to assess for small fiber density on September 29th at 11 AM.     I advised her to continue to take modafinil  200 mg in the morning for daytime hypersomnolence.  I provided a new prescription for modafinil .      Charlie Rima, M.D    Board Certified, American Board of Psychiatry and Neurology  [facility]    Phone #: [phone]  Fax #:      [phone]      Verbal consent obtained to record this visit.        [1]   Past Medical History:  Diagnosis Date    Pain     bilat lower ext    Weakness    [2]   Current Outpatient Medications on File Prior to Visit   Medication Sig Dispense Refill    levonorgestrel (MIRENA) 20 MCG/24HR IUD 1 Intra Uterine Device (52 mg) by Intrauterine route once Dec 2018      Multiple Vitamins-Minerals (MULTIVITAMIN WITH MINERALS) tablet Take 1 tablet by mouth daily      VITAMIN D PO Take 5,000 Units by mouth daily      [DISCONTINUED] amitriptyline  (ELAVIL ) 75 MG tablet Take 1 tablet (75 mg) by mouth nightly 30 tablet 5    [DISCONTINUED] modafinil  (PROVIGIL ) 200 MG tablet Take 1 tablet (200 mg) by mouth every morning 30 tablet 5    [DISCONTINUED] pregabalin  (LYRICA ) 150 MG capsule Take 1 capsule (150 mg) by mouth 2 (two) times daily 60 capsule 5    diazePAM  (VALIUM ) 5 MG tablet Take 1 tablet (5 mg total)  by mouth every 12 (twelve) hours as needed (Muscle spasms and stiffness) (Patient not taking: Reported on 05/23/2024) 60 tablet 4    [DISCONTINUED] Nurtec  75 MG Tablet Dispersible Take 1 tablet (75 mg) by mouth every other day Take at the onset of migraine wait 24 hour to take it again if needed (Patient not taking: Reported on 05/23/2024) 16 tablet 5    [DISCONTINUED] Qulipta  60 MG Tab Take 1 tablet (60 mg) by mouth every morning (Patient not taking: Reported on 05/23/2024) 30 tablet 5     No current facility-administered medications on file prior to visit.

## 2024-05-24 ENCOUNTER — Encounter: Payer: Self-pay | Admitting: Neurology

## 2024-06-01 ENCOUNTER — Other Ambulatory Visit: Payer: Self-pay | Admitting: Neurology

## 2024-06-01 DIAGNOSIS — G43009 Migraine without aura, not intractable, without status migrainosus: Secondary | ICD-10-CM

## 2024-06-01 MED ORDER — QULIPTA 60 MG PO TABS
60.0000 mg | ORAL_TABLET | Freq: Every morning | ORAL | 5 refills | Status: AC
Start: 2024-06-01 — End: ?

## 2024-06-04 ENCOUNTER — Other Ambulatory Visit: Payer: Self-pay

## 2024-06-05 ENCOUNTER — Encounter: Payer: Self-pay | Admitting: Neurology

## 2024-06-06 ENCOUNTER — Other Ambulatory Visit: Payer: Self-pay

## 2024-06-06 ENCOUNTER — Telehealth: Payer: Self-pay | Admitting: Neurology

## 2024-06-06 ENCOUNTER — Encounter: Payer: Self-pay | Admitting: Neurology

## 2024-06-06 NOTE — Telephone Encounter (Signed)
 Copied from CRM #[id number]. Topic: Clinical Support - Speak With Nurse  >> Jun 06, 2024  8:13 AM Turkessa Ostrom D wrote:  Acupuncturist with [employer], contacted the call center and requesting support with getting prior authorization for medication order received. There is missing information to complete the Prior Auth for Qulipta  60 MG Tablet. The correct form this request was faxed to the office. Rep requested that it be filled out and sent back to the office via fax.                                                                                                     Telephone Number: [phone] opt 3 and opt 3 to get the the correct dept.  Fax #: [phone]  Contact Name: Sia B                                                                                                             Medication Name:  Qulipta  60 MG Tablet                                                                                           Can follow-up please be completed with insurance  Thank you

## 2024-06-06 NOTE — Progress Notes (Signed)
 The prescription has been received by the Pharmacy Care Team. A benefit investigation is currently in process.    To follow up on the outcome of the benefit investigation, please check Episodes, Referrals or specialty pharmacy encounter section in the Patient chart review

## 2024-06-06 NOTE — Progress Notes (Signed)
 Benefits Investigation Summary  Medication: Atogepant  (Qulipta )     Prescription: Renewal  Benefits Outcome: Paid     Estimated Rx Copay:  15.00     Additional Notes:PA exp 06/06/2025  Next Step: Prescription has been released to [id number] - [place], [place] - [address]    :

## 2024-06-06 NOTE — Telephone Encounter (Signed)
 Error

## 2024-06-06 NOTE — Progress Notes (Signed)
 Prior Authorization Details    Which medication is the prior authorization for? Qulitpa     What is the patient's pharmacy benefit? Aetna     Prior Auth Status: Initial Approved     PA Number: [id number]     PA Start Date: 06/04/24     PA Expiration Date: 06/06/2025     Estimated Rx Copay: 15.00

## 2024-06-11 ENCOUNTER — Ambulatory Visit: Admitting: Neurology

## 2024-06-13 ENCOUNTER — Telehealth: Payer: Self-pay | Admitting: *Deleted

## 2024-06-13 NOTE — Telephone Encounter (Signed)
 Copied from CRM #[id number]. Topic: Medical Record Request - Records Request >> Jun 12, 2024  2:44 PM Harlene ORN wrote: Reason for CRM: [facility]  Calling to confirm if the patient is established in the practice and if the Practice has received the order for office forms.  Phone: [phone] Fax: [phone] ext: [phone]

## 2024-06-15 ENCOUNTER — Encounter: Payer: Self-pay | Admitting: Neurology

## 2024-06-27 ENCOUNTER — Telehealth: Payer: Self-pay | Admitting: Physician Assistant

## 2024-06-27 NOTE — Telephone Encounter (Signed)
 [employer] faxed disability forms, to be filled out by provider. Patient requested to send it back via Fax within ASAP. Document is located in providers tray at front office.Please advise at [phone].

## 2024-06-27 NOTE — Telephone Encounter (Signed)
 Received forms, see other message.

## 2024-06-27 NOTE — Telephone Encounter (Signed)
 Please call patient and schedule an appointment to f/u on FMLA. We have not seen patient since July.

## 2024-06-28 NOTE — Telephone Encounter (Signed)
 Patient would like to know if appointment can be virtual...?    CB#[phone]

## 2024-06-28 NOTE — Telephone Encounter (Signed)
 LVM schedule ov with Job

## 2024-06-28 NOTE — Telephone Encounter (Signed)
 Please call patient and schedule an in person appointment in order to fill out FMLA.

## 2024-07-05 ENCOUNTER — Ambulatory Visit: Admitting: Physician Assistant

## 2024-08-08 ENCOUNTER — Other Ambulatory Visit: Payer: Self-pay

## 2024-08-08 NOTE — Progress Notes (Signed)
 Prior Authorization Details    Which medication is the prior authorization for? Qulipta  60 MG Tablet       What is the patient's pharmacy benefit? Aetna       Prior Auth Status: Submitted       Submission Method: CMM ([id number])

## 2024-08-08 NOTE — Progress Notes (Signed)
 Prior Authorization Approval Details    Which medication is the prior authorization for? Qulipta  60 MG Tablet       What is the patient's pharmacy benefit? Aetna       Prior Auth Status: Initial Approved       PA Number: [id number]       PA Start Date: 08/08/24       PA Expiration Date: 08/08/25       Estimated Rx Copay: $15
